# Patient Record
Sex: Male | Born: 1950 | Race: White | Hispanic: No | Marital: Married | Smoking: Never smoker
Health system: Southern US, Community
[De-identification: ages and names within clinical notes are randomized; demographics above are authoritative.]

## PROBLEM LIST (undated history)

## (undated) DIAGNOSIS — E039 Hypothyroidism, unspecified: Secondary | ICD-10-CM

## (undated) DIAGNOSIS — I471 Supraventricular tachycardia, unspecified: Secondary | ICD-10-CM

## (undated) DIAGNOSIS — R7989 Other specified abnormal findings of blood chemistry: Secondary | ICD-10-CM

## (undated) DIAGNOSIS — C4490 Unspecified malignant neoplasm of skin, unspecified: Secondary | ICD-10-CM

## (undated) DIAGNOSIS — Z7902 Long term (current) use of antithrombotics/antiplatelets: Secondary | ICD-10-CM

## (undated) DIAGNOSIS — E785 Hyperlipidemia, unspecified: Secondary | ICD-10-CM

## (undated) DIAGNOSIS — C439 Malignant melanoma of skin, unspecified: Secondary | ICD-10-CM

## (undated) DIAGNOSIS — I700 Atherosclerosis of aorta: Secondary | ICD-10-CM

## (undated) DIAGNOSIS — E7800 Pure hypercholesterolemia, unspecified: Secondary | ICD-10-CM

## (undated) DIAGNOSIS — I2510 Atherosclerotic heart disease of native coronary artery without angina pectoris: Secondary | ICD-10-CM

## (undated) DIAGNOSIS — I10 Essential (primary) hypertension: Secondary | ICD-10-CM

## (undated) DIAGNOSIS — K219 Gastro-esophageal reflux disease without esophagitis: Secondary | ICD-10-CM

## (undated) DIAGNOSIS — N529 Male erectile dysfunction, unspecified: Secondary | ICD-10-CM

## (undated) DIAGNOSIS — I499 Cardiac arrhythmia, unspecified: Secondary | ICD-10-CM

## (undated) DIAGNOSIS — K5790 Diverticulosis of intestine, part unspecified, without perforation or abscess without bleeding: Secondary | ICD-10-CM

## (undated) DIAGNOSIS — I480 Paroxysmal atrial fibrillation: Secondary | ICD-10-CM

## (undated) HISTORY — PX: NECK SURGERY: SHX720

## (undated) HISTORY — DX: Hypothyroidism, unspecified: E03.9

## (undated) HISTORY — DX: Supraventricular tachycardia: I47.1

## (undated) HISTORY — DX: Supraventricular tachycardia, unspecified: I47.10

## (undated) HISTORY — PX: MELANOMA EXCISION: SHX5266

## (undated) HISTORY — DX: Paroxysmal atrial fibrillation: I48.0

## (undated) HISTORY — DX: Malignant melanoma of skin, unspecified: C43.9

## (undated) HISTORY — DX: Male erectile dysfunction, unspecified: N52.9

## (undated) HISTORY — DX: Pure hypercholesterolemia, unspecified: E78.00

## (undated) HISTORY — DX: Unspecified malignant neoplasm of skin, unspecified: C44.90

---

## 1983-02-17 HISTORY — PX: MELANOMA EXCISION: SHX5266

## 2002-12-07 DIAGNOSIS — I480 Paroxysmal atrial fibrillation: Secondary | ICD-10-CM

## 2002-12-07 HISTORY — PX: NECK SURGERY: SHX720

## 2002-12-07 HISTORY — DX: Paroxysmal atrial fibrillation: I48.0

## 2008-01-09 LAB — HM COLONOSCOPY

## 2012-12-07 HISTORY — PX: EYE SURGERY: SHX253

## 2014-12-14 DIAGNOSIS — I480 Paroxysmal atrial fibrillation: Secondary | ICD-10-CM

## 2014-12-14 DIAGNOSIS — E785 Hyperlipidemia, unspecified: Secondary | ICD-10-CM | POA: Insufficient documentation

## 2016-04-02 DIAGNOSIS — E039 Hypothyroidism, unspecified: Secondary | ICD-10-CM | POA: Insufficient documentation

## 2016-04-02 DIAGNOSIS — E7800 Pure hypercholesterolemia, unspecified: Secondary | ICD-10-CM | POA: Insufficient documentation

## 2017-02-10 ENCOUNTER — Encounter: Payer: Self-pay | Admitting: *Deleted

## 2017-02-18 ENCOUNTER — Encounter (INDEPENDENT_AMBULATORY_CARE_PROVIDER_SITE_OTHER): Payer: Self-pay

## 2017-02-18 ENCOUNTER — Ambulatory Visit (INDEPENDENT_AMBULATORY_CARE_PROVIDER_SITE_OTHER): Payer: Medicare Other | Admitting: Cardiovascular Disease

## 2017-02-18 ENCOUNTER — Encounter: Payer: Self-pay | Admitting: Cardiovascular Disease

## 2017-02-18 VITALS — BP 134/66 | HR 64 | Ht 72.0 in | Wt 197.8 lb

## 2017-02-18 DIAGNOSIS — Z7689 Persons encountering health services in other specified circumstances: Secondary | ICD-10-CM

## 2017-02-18 DIAGNOSIS — E785 Hyperlipidemia, unspecified: Secondary | ICD-10-CM

## 2017-02-18 DIAGNOSIS — I480 Paroxysmal atrial fibrillation: Secondary | ICD-10-CM | POA: Diagnosis not present

## 2017-02-18 MED ORDER — ROSUVASTATIN CALCIUM 40 MG PO TABS: 40.0000 mg | ORAL_TABLET | Freq: Every day | ORAL | 3 refills | Status: DC

## 2017-02-18 NOTE — Progress Notes (Signed)
Cardiology Office Note   Date:  02/18/2017   ID:  [name], DOB 02-06-1951, MRN [id number]  PCP:  Marcello Fennel, MD  Cardiologist:   Kathlyn Sacramento, MD   Chief Complaint  Patient presents with  . other    New patient. Self referral for afib. Previous cardiologist in [place] Dr. Darrin Nipper. Meds reviewed verbally with patient.       History of Present Illness: [name] [name] is a 66 y.o. male who Is here today to establish cardiovascular care regarding paroxysmal atrial fibrillation. He was managed in the past by Dr. Darrin Nipper in [place]. He was diagnosed with atrial fibrillation in 2004. He was treated with metoprolol initially which was discontinued due to nightmares. He had a stress echocardiogram in 2013 which was unremarkable. He has been on diltiazem for atrial fibrillation. He had a Holter monitor in January 2016 which showed no evidence of atrial fibrillation.He had premature beats at that time. 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 He is a lifelong nonsmoker. He has been doing well overall with no chest pain, shortness of breath or tachycardia. He has occasional palpitations    Past Medical History:  Diagnosis Date  . Acquired hypothyroidism   . Paroxysmal atrial fibrillation (HCC)    Diagnosed in 2004  . Pure hypercholesterolemia     Past Surgical History:  Procedure Laterality Date  . MELANOMA EXCISION    . NECK SURGERY       Current Outpatient Prescriptions  Medication Sig Dispense Refill  . aspirin 81 MG chewable tablet Chew 81 mg by mouth daily.    . cetirizine (ZYRTEC) 10 MG tablet Take 10 mg by mouth daily.    Marland Kitchen diltiazem (DILACOR XR) 180 MG 24 hr capsule Take 180 mg by mouth daily.    Marland Kitchen esomeprazole (NEXIUM) 20 MG capsule Take 20 mg by mouth daily at 12 noon.    Marland Kitchen levothyroxine (SYNTHROID, LEVOTHROID) 25 MCG tablet Take 25 mcg by  mouth daily before breakfast.    . Multiple Vitamin (MULTIVITAMIN) tablet Take 1 tablet by mouth daily.    . rosuvastatin (CRESTOR) 40 MG tablet Take 1 tablet (40 mg total) by mouth daily. 90 tablet 3   No current facility-administered medications for this visit.     Allergies:   Vicodin [hydrocodone-acetaminophen]    Social History:  The patient  reports that he has never smoked. He has never used smokeless tobacco. He reports that he drinks alcohol. He reports that he does not use drugs.   Family History:  The patient's family history includes Stroke in his father.  He later died of myocardial infarction at the age of 89.There is strong family history of hyperlipidemia and coronary artery disease on his father's side.   ROS:  Please see the history of present illness.   Otherwise, review of systems are positive for none.   All other systems are reviewed and negative.    PHYSICAL EXAM: VS:  BP 134/66 (BP Location: Left Arm, Patient Position: Sitting, Cuff Size: Normal)   Pulse 64   Ht 6' (1.829 m)   Wt 197 lb 12 oz (89.7 kg)   BMI 26.82 kg/m  , BMI Body mass index is 26.82 kg/m. GEN: Well nourished, well developed, in no acute distress  HEENT: normal  Neck: no JVD, carotid bruits, or masses Cardiac: RRR; no murmurs, rubs, or gallops,no edema  Respiratory:  clear to auscultation bilaterally, normal work of breathing GI: soft, nontender, nondistended, + BS MS: no deformity or atrophy  Skin: warm and dry, no rash Neuro:  Strength and sensation are intact Psych: euthymic mood, full affect   EKG:  EKG is ordered today. The ekg ordered today demonstrates normal sinus rhythm with no significant ST or T wave changes.     Recent Labs: No results found for requested labs within last 8760 hours.    Lipid Panel No results found for: CHOL, TRIG, HDL, CHOLHDL, VLDL, LDLCALC, LDLDIRECT    Wt Readings from Last 3 Encounters:  02/18/17 197 lb 12 oz (89.7 kg)      Other studies  Reviewed: Additional studies/ records that were reviewed today include:  Previous records from cardiology and primary care physician including labs were reviewed. Review of the above records demonstrates: Summarized above. 30 minutes were spent reviewing his prior records.  PAD Screen 02/18/2017  Previous PAD dx? No  Previous surgical procedure? No  Pain with walking? No  Feet/toe relief with dangling? No  Painful, non-healing ulcers? No  Extremities discolored? No      ASSESSMENT AND PLAN:  1.  Paroxysmal atrial fibrillation: No evidence of recurrent arrhythmia and currently on long-term treatment with diltiazem. CHADS VASc score is 1. Thus, I'm going to continue low-dose aspirin. No indication for anticoagulation at this point. I might consider an echocardiogram in the future. He had echocardiograms in the past that were unremarkable.  2. Familial hyperlipidemia: The patient's lipid profile and family history are highly suggestive of familial hyperlipidemia. I reviewed his most recent labs from December which showed unremarkable labs overall. His cholesterol was 226, triglyceride 106, HDL 51 and LDL was 154. That is in spite of being on atorvastatin. The dose of atorvastatin cannot be increased to 80 due to being on diltiazem. It's also reported that he had abnormal liver enzymes with higher dose in the past. Thus, I decided to switch him to rosuvastatin 40 mg daily. I requested lipid and liver profile in 6 weeks.    Disposition:   FU with me in 6 weeks  Signed,  Kathlyn Sacramento, MD  02/18/2017 11:52 AM    [place]

## 2017-02-18 NOTE — Patient Instructions (Signed)
Medication Instructions:  Your physician has recommended you make the following change in your medication:  STOP taking atorvastatin START taking rosuvastatin 40mg  once daily   Labwork: Lipid and liver profile in 6 weeks. Nothing to eat or drink after midnight the evening before your labs. Lab work at [facility] outpatient lab. Check in at the first desk on the right. No appointment needed.   Testing/Procedures: none  Follow-Up: Your physician wants you to follow-up in: one year with Dr. Fletcher Anon.  You will receive a reminder letter in the mail two months in advance. If you don't receive a letter, please call our office to schedule the follow-up appointment.   Any Other Special Instructions Will Be Listed Below (If Applicable).     If you need a refill on your cardiac medications before your next appointment, please call your pharmacy.

## 2017-02-19 ENCOUNTER — Other Ambulatory Visit: Payer: Self-pay

## 2017-02-19 ENCOUNTER — Telehealth: Payer: Self-pay | Admitting: Cardiovascular Disease

## 2017-02-19 MED ORDER — DILTIAZEM HCL ER 180 MG PO CP24: 180.0000 mg | ORAL_CAPSULE | Freq: Every day | ORAL | 0 refills | Status: DC

## 2017-02-19 NOTE — Telephone Encounter (Signed)
Pt calling   *STAT* If patient is at the pharmacy, call can be transferred to refill team.   1. Which medications need to be refilled? (please list name of each medication and dose if known)  diltiazem  2. Which pharmacy/location (including street and city if local pharmacy) is medication to be sent to? [facility] [address]  3. Do they need a 30 day or 90 day supply? 90 day

## 2017-02-19 NOTE — Telephone Encounter (Signed)
Refill sent.

## 2017-02-19 NOTE — Telephone Encounter (Signed)
Requested Prescriptions   Signed Prescriptions Disp Refills  . diltiazem (DILACOR XR) 180 MG 24 hr capsule 90 capsule 0    Sig: Take 1 capsule (180 mg total) by mouth daily.    Authorizing Provider: Kathlyn Sacramento A    Ordering User: Janan Ridge

## 2017-04-27 ENCOUNTER — Telehealth: Payer: Self-pay | Admitting: Cardiovascular Disease

## 2017-04-27 NOTE — Telephone Encounter (Signed)
Left detailed message regarding need for fasting labs at the [facility] outpatient lab. Provided call back number if questions.

## 2017-05-20 ENCOUNTER — Other Ambulatory Visit: Payer: Self-pay | Admitting: Cardiovascular Disease

## 2017-06-08 ENCOUNTER — Telehealth: Payer: Self-pay | Admitting: Cardiovascular Disease

## 2017-06-08 NOTE — Telephone Encounter (Signed)
Pt understands he may have labs drawn at the [facility] outpatient lab, no appt necessary. He understands nothing to eat or drink after midnight the evening before his lab work. Pt had no further questions

## 2017-06-08 NOTE — Telephone Encounter (Signed)
Pt calling to let us know [facility].  He is aware is he is a bit behind to do them  But wanted to let us know he has not forgotten  That he is just not yet had a chance to come by and do them He states he will try this week to do them but if not it will be early august when he does them  Just wanted to make sure this is okay to do  Please advise

## 2017-06-15 ENCOUNTER — Other Ambulatory Visit: Payer: Self-pay | Admitting: *Deleted

## 2017-06-15 ENCOUNTER — Telehealth: Payer: Self-pay | Admitting: Cardiovascular Disease

## 2017-06-15 MED ORDER — DILTIAZEM HCL ER 180 MG PO CP24: 180.0000 mg | ORAL_CAPSULE | Freq: Every day | ORAL | 3 refills | Status: DC

## 2017-06-15 NOTE — Telephone Encounter (Signed)
Requested Prescriptions   Signed Prescriptions Disp Refills  . diltiazem (DILACOR XR) 180 MG 24 hr capsule 90 capsule 3    Sig: Take 1 capsule (180 mg total) by mouth daily.    Authorizing Provider: Minna Merritts    Ordering User: Britt Bottom

## 2017-06-15 NOTE — Telephone Encounter (Signed)
°*  STAT* If patient is at the pharmacy, call can be transferred to refill team.   1. Which medications need to be refilled? (please list name of each medication and dose if known)  generic for Diltizem   2. Which pharmacy/location (including street and city if local pharmacy) is medication to be sent to?  [facility] [address]  3. Do they need a 30 day or 90 day supply?  90 day   Pt is going out tonight and needs this called in asap

## 2017-08-12 ENCOUNTER — Telehealth: Payer: Self-pay | Admitting: Cardiovascular Disease

## 2017-08-12 DIAGNOSIS — Z79899 Other long term (current) drug therapy: Secondary | ICD-10-CM

## 2017-08-12 DIAGNOSIS — E785 Hyperlipidemia, unspecified: Secondary | ICD-10-CM

## 2017-08-12 NOTE — Telephone Encounter (Signed)
No answer. Left message to call back.   

## 2017-08-12 NOTE — Telephone Encounter (Signed)
Patient was to have labs at [facility] earlier this year as ordered by Dr Fletcher Anon and did not get them.  He got them with his PCP's office and they are in [place] from 07/22/17. Routing to Dr Fletcher Anon for review.  No answer. Left detailed message, ok per DPR, that we are able to see his lab work in [place], I will forward to Dr Fletcher Anon to make him aware, and let patient know if Dr Fletcher Anon makes any changes with his plan of care.

## 2017-08-12 NOTE — Telephone Encounter (Signed)
His labs were unremarkable except for mildly abnormal liver tests. His cholesterol improved from before. Repeat liver profile in 3-6 months. Continue same medications.

## 2017-08-12 NOTE — Telephone Encounter (Signed)
Pt calling to let us know he did the blood work we ordered was done at his PCP office. He states he was told we can see them, they are in [facility]   Please advise.

## 2017-08-13 NOTE — Telephone Encounter (Signed)
S/w patient. He verbalized understanding of Dr. Tyrell Antonio recommendations and repeat labs in 3-6 months.  Lab slips mailed to patient.

## 2017-08-18 ENCOUNTER — Other Ambulatory Visit: Payer: Self-pay | Admitting: *Deleted

## 2017-08-18 ENCOUNTER — Telehealth: Payer: Self-pay | Admitting: Cardiovascular Disease

## 2017-08-18 MED ORDER — DILTIAZEM HCL ER 180 MG PO CP24: 180.0000 mg | ORAL_CAPSULE | Freq: Every day | ORAL | 3 refills | Status: DC

## 2017-08-18 NOTE — Telephone Encounter (Signed)
diltiazem (DILACOR XR) 180 MG 24 hr capsule 30 capsule 3 08/18/2017    Sig - Route: Take 1 capsule (180 mg total) by mouth daily. - Oral   Sent to pharmacy as: diltiazem (DILACOR XR) 180 MG 24 hr capsule   E-Prescribing Status: Receipt confirmed by pharmacy (08/18/2017 8:30 AM EDT)

## 2017-08-18 NOTE — Telephone Encounter (Signed)
Pt would like cardia XT refilled sent to [facility].

## 2017-11-18 ENCOUNTER — Telehealth: Payer: Self-pay | Admitting: Cardiovascular Disease

## 2017-11-18 ENCOUNTER — Other Ambulatory Visit: Payer: Self-pay

## 2017-11-18 MED ORDER — DILTIAZEM HCL ER 180 MG PO CP24: 180.0000 mg | ORAL_CAPSULE | Freq: Every day | ORAL | 0 refills | Status: DC

## 2017-11-18 NOTE — Telephone Encounter (Signed)
°*  STAT* If patient is at the pharmacy, call can be transferred to refill team.   1. Which medications need to be refilled? (please list name of each medication and dose if known)  Generic for Diltizem   2. Which pharmacy/location (including street and city if local pharmacy) is medication to be sent to? [facility] on [address]   3. Do they need a 30 day or 90 day supply?  90 day

## 2018-02-09 ENCOUNTER — Other Ambulatory Visit: Payer: Self-pay | Admitting: Cardiovascular Disease

## 2018-02-17 ENCOUNTER — Ambulatory Visit: Payer: Medicare Other | Admitting: Cardiovascular Disease

## 2018-02-17 ENCOUNTER — Encounter: Payer: Self-pay | Admitting: Cardiovascular Disease

## 2018-02-17 VITALS — BP 130/60 | HR 73 | Ht 72.0 in | Wt 202.2 lb

## 2018-02-17 DIAGNOSIS — I480 Paroxysmal atrial fibrillation: Secondary | ICD-10-CM

## 2018-02-17 DIAGNOSIS — E7801 Familial hypercholesterolemia: Secondary | ICD-10-CM

## 2018-02-17 DIAGNOSIS — R079 Chest pain, unspecified: Secondary | ICD-10-CM | POA: Diagnosis not present

## 2018-02-17 MED ORDER — EZETIMIBE 10 MG PO TABS: 10.0000 mg | ORAL_TABLET | Freq: Every day | ORAL | 3 refills | Status: DC

## 2018-02-17 NOTE — Progress Notes (Signed)
Cardiology Office Note   Date:  02/17/2018   ID:  [name], DOB 25-Aug-1951, MRN [id number]  PCP:  Derinda Late, MD  Cardiologist:   Kathlyn Sacramento, MD   Chief Complaint  Patient presents with  . other    12 month follow up. Meds reviewed by the pt. verbally. "doing well."       History of Present Illness: [name] [name] is a 67 y.o. male who Is here today to establish cardiovascular care regarding paroxysmal atrial fibrillation. He was managed in the past by Dr. Darrin Nipper in [place]. He was diagnosed with atrial fibrillation in 2004. He was treated with metoprolol initially which was discontinued due to nightmares. He had a stress echocardiogram in 2013 which was unremarkable. He has been on diltiazem for atrial fibrillation. He had a Holter monitor in January 2016 which showed no evidence of atrial fibrillation.He had premature beats at that time. 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 He is a lifelong nonsmoker. During last visit, I switch him to high-dose rosuvastatin.  He has been tolerating the medication with no side effects.  He reports an episode of abdominal pain over the weekend.  It was in the upper epigastric and right upper quadrant area.  The pain lasted for about 2 hours and radiated to his back.  It was associated with heartburn.  He does report occasional chest aching occasionally with exertion.   Past Medical History:  Diagnosis Date  . Acquired hypothyroidism   . Paroxysmal atrial fibrillation (HCC)    Diagnosed in 2004  . Pure hypercholesterolemia     Past Surgical History:  Procedure Laterality Date  . MELANOMA EXCISION    . NECK SURGERY       Current Outpatient Medications  Medication Sig Dispense Refill  . aspirin 81 MG chewable tablet Chew 81 mg by mouth daily.    . cetirizine (ZYRTEC) 10 MG tablet Take 10 mg by mouth daily.     Marland Kitchen diltiazem (DILACOR XR) 180 MG 24 hr capsule Take 1 capsule (180 mg total) by mouth daily. 90 capsule 0  . esomeprazole (NEXIUM) 20 MG capsule Take 20 mg by mouth daily at 12 noon.    Marland Kitchen levothyroxine (SYNTHROID, LEVOTHROID) 25 MCG tablet Take 25 mcg by mouth daily before breakfast.    . Multiple Vitamin (MULTIVITAMIN) tablet Take 1 tablet by mouth daily.    . rosuvastatin (CRESTOR) 40 MG tablet TAKE 1 TABLET BY MOUTH ONCE DAILY 90 tablet 0  . ezetimibe (ZETIA) 10 MG tablet Take 1 tablet (10 mg total) by mouth daily. 90 tablet 3   No current facility-administered medications for this visit.     Allergies:   Hydrocodone-acetaminophen and Vicodin [hydrocodone-acetaminophen]    Social History:  The patient  reports that  has never smoked. he has never used smokeless tobacco. He reports that he drinks alcohol. He reports that he does not use drugs.   Family History:  The patient's family history includes Stroke in his father.  He later died of myocardial infarction at the age of 29.There is strong family history of hyperlipidemia and coronary artery disease on his father's side.   ROS:  Please see the history of present illness.   Otherwise, review of systems are positive for none.   All other systems are reviewed and negative.    PHYSICAL EXAM: VS:  BP 130/60 (BP Location: Left Arm, Patient  Position: Sitting, Cuff Size: Normal)   Pulse 73   Ht 6' (1.829 m)   Wt 202 lb 4 oz (91.7 kg)   BMI 27.43 kg/m  , BMI Body mass index is 27.43 kg/m. GEN: Well nourished, well developed, in no acute distress  HEENT: normal  Neck: no JVD, carotid bruits, or masses Cardiac: RRR; no murmurs, rubs, or gallops,no edema  Respiratory:  clear to auscultation bilaterally, normal work of breathing GI: soft, nontender, nondistended, + BS MS: no deformity or atrophy  Skin: warm and dry, no rash Neuro:  Strength and sensation are intact Psych: euthymic mood, full affect   EKG:  EKG is ordered today. The  ekg ordered today demonstrates normal sinus rhythm with no significant ST or T wave changes.     Recent Labs: No results found for requested labs within last 8760 hours.    Lipid Panel No results found for: CHOL, TRIG, HDL, CHOLHDL, VLDL, LDLCALC, LDLDIRECT    Wt Readings from Last 3 Encounters:  02/17/18 202 lb 4 oz (91.7 kg)  02/18/17 197 lb 12 oz (89.7 kg)        PAD Screen 02/18/2017  Previous PAD dx? No  Previous surgical procedure? No  Pain with walking? No  Feet/toe relief with dangling? No  Painful, non-healing ulcers? No  Extremities discolored? No      ASSESSMENT AND PLAN:  1.  Paroxysmal atrial fibrillation: No evidence of recurrent arrhythmia and currently on long-term treatment with diltiazem. CHADS VASc score is 1.  Continue diltiazem.  2. Familial hyperlipidemia: I reviewed most recent lipid profile in August which showed significant improvement in LDL down to 119.  Liver enzymes were mildly elevated.  I elected to add Zetia 10 mg daily.  Repeat labs in 6 weeks.  3.  Atypical chest pain: I requested a treadmill stress test for evaluation.  4.  Abdominal pain: He is going to see his primary care physician for evaluation.  The symptoms seem to be GI in nature.  Recommend evaluation for gallbladder disease or GERD.   Disposition:   FU with me in 1 year.   Signed,  Kathlyn Sacramento, MD  02/17/2018 4:56 PM    [place]

## 2018-02-17 NOTE — Patient Instructions (Signed)
Medication Instructions:  Your physician has recommended you make the following change in your medication:  1- START Zetia 10 mg (1 tablet) by mouth once a day.   Labwork: Your physician recommends that you return for lab work in: [place].  - Around March 31, 2018. - You will need to be fasting. DO NOT EAT or drink after midnight the morning of the lab work.  - Please go to the [facility]. You will check in at the front desk to the right as you walk into the atrium. Valet Parking is offered if needed.    Testing/Procedures: Your physician has requested that you have an exercise tolerance test. For further information please visit [email or web address]. Please also follow instruction sheet, as given. - DO NOT TAKE Diltiazem the morning of the stress test.   Follow-Up: Your physician wants you to follow-up in: [place]. You will receive a reminder letter in the mail two months in advance. If you don't receive a letter, please call our office to schedule the follow-up appointment. If you need a refill on your cardiac medications before your next appointment, please call your pharmacy.    Exercise Stress Electrocardiogram An exercise stress electrocardiogram is a test to check how blood flows to your heart. It is done to find areas of poor blood flow. You will need to walk on a treadmill for this test. The electrocardiogram will record your heartbeat when you are at rest and when you are exercising. What happens before the procedure?  Do not have drinks with caffeine or foods with caffeine for 24 hours before the test, or as told by your doctor. This includes coffee, tea (even decaf tea), sodas, chocolate, and cocoa.  Follow your doctor's instructions about eating and drinking before the test.  Ask your doctor what medicines you should or should not take before the test. Take your medicines with water unless told by your doctor not to.  If you  use an inhaler, bring it with you to the test.  Bring a snack to eat after the test.  Do not  smoke for 4 hours before the test.  Do not put lotions, powders, creams, or oils on your chest before the test.  Wear comfortable shoes and clothing. What happens during the procedure?  You will have patches put on your chest. Small areas of your chest may need to be shaved. Wires will be connected to the patches.  Your heart rate will be watched while you are resting and while you are exercising.  You will walk on the treadmill. The treadmill will slowly get faster to raise your heart rate.  The test will take about 1-2 hours. What happens after the procedure?  Your heart rate and blood pressure will be watched after the test.  You may return to your normal diet, activities, and medicines or as told by your doctor. This information is not intended to replace advice given to you by your health care provider. Make sure you discuss any questions you have with your health care provider. Document Released: 05/11/2008 Document Revised: 07/22/2016 Document Reviewed: 07/31/2013 Elsevier Interactive Patient Education  [employer].

## 2018-03-15 ENCOUNTER — Telehealth: Payer: Self-pay | Admitting: Cardiovascular Disease

## 2018-03-15 NOTE — Telephone Encounter (Signed)
Left message on pt's cell VM with GXT instructions. Provided call back number if questions.

## 2018-03-16 ENCOUNTER — Ambulatory Visit (INDEPENDENT_AMBULATORY_CARE_PROVIDER_SITE_OTHER): Payer: Medicare Other

## 2018-03-16 DIAGNOSIS — R079 Chest pain, unspecified: Secondary | ICD-10-CM

## 2018-03-18 LAB — EXERCISE TOLERANCE TEST
CHL CUP MPHR: 153 {beats}/min
CHL CUP RESTING HR STRESS: 71 {beats}/min
CSEPHR: 92 %
CSEPPHR: 141 {beats}/min
Estimated workload: 9.6 METS
Exercise duration (min): 7 min
Exercise duration (sec): 48 s

## 2018-05-11 ENCOUNTER — Other Ambulatory Visit
Admission: RE | Admit: 2018-05-11 | Discharge: 2018-05-11 | Disposition: A | Discharge status: Home / Self Care | Payer: Medicare Other | Source: Ambulatory Visit | Attending: Cardiovascular Disease | Admitting: Cardiovascular Disease

## 2018-05-11 DIAGNOSIS — R079 Chest pain, unspecified: Secondary | ICD-10-CM | POA: Diagnosis present

## 2018-05-11 DIAGNOSIS — E7801 Familial hypercholesterolemia: Secondary | ICD-10-CM | POA: Diagnosis present

## 2018-05-11 LAB — LIPID PANEL
CHOLESTEROL: 145 mg/dL (ref 0–200)
HDL: 46 mg/dL (ref >40)
LDL Cholesterol: 73 mg/dL (ref 0–99)
Total CHOL/HDL Ratio: 3.2 RATIO
Triglycerides: 129 mg/dL (ref <150)
VLDL: 26 mg/dL (ref 0–40)

## 2018-05-11 LAB — HEPATIC FUNCTION PANEL
ALBUMIN: 4.1 g/dL (ref 3.5–5.0)
ALT: 61 U/L (ref 17–63)
AST: 47 U/L — AB (ref 15–41)
Alkaline Phosphatase: 76 U/L (ref 38–126)
Bilirubin, Direct: <0.1 mg/dL — ABNORMAL LOW (ref 0.1–0.5)
TOTAL PROTEIN: 6.8 g/dL (ref 6.5–8.1)
Total Bilirubin: 0.6 mg/dL (ref 0.3–1.2)

## 2018-05-12 ENCOUNTER — Other Ambulatory Visit: Payer: Self-pay | Admitting: Cardiovascular Disease

## 2018-05-19 ENCOUNTER — Other Ambulatory Visit: Payer: Self-pay | Admitting: Cardiovascular Disease

## 2019-02-06 ENCOUNTER — Other Ambulatory Visit: Payer: Self-pay | Admitting: Cardiovascular Disease

## 2019-02-06 DIAGNOSIS — R079 Chest pain, unspecified: Secondary | ICD-10-CM

## 2019-03-07 ENCOUNTER — Telehealth: Payer: Self-pay | Admitting: Cardiovascular Disease

## 2019-03-07 NOTE — Telephone Encounter (Signed)
Virtual Visit Pre-Appointment Phone Call  Steps For Call:  1. Confirm consent - "In the setting of the current Covid19 crisis, you are scheduled for a (phone or video) visit with your provider on (date) at (time).  Just as we do with many in-office visits, in order for you to participate in this visit, we must obtain consent.  If you'd like, I can send this to your mychart (if signed up) or email for you to review.  Otherwise, I can obtain your verbal consent now.  All virtual visits are billed to your insurance company just like a normal visit would be.  By agreeing to a virtual visit, we'd like you to understand that the technology does not allow for your provider to perform an examination, and thus may limit your provider's ability to fully assess your condition.  Finally, though the technology is pretty good, we cannot assure that it will always work on either your or our end, and in the setting of a video visit, we may have to convert it to a phone-only visit.  In either situation, we cannot ensure that we have a secure connection.  Are you willing to proceed?"  2. Give patient instructions for WebEx download to smartphone as below if video visit  3. Advise patient to be prepared with any vital sign or heart rhythm information, their current medicines, and a piece of paper and pen handy for any instructions they may receive the day of their visit  4. Inform patient they will receive a phone call 15 minutes prior to their appointment time (may be from unknown caller ID) so they should be prepared to answer  5. Confirm that appointment type is correct in Epic appointment notes (video vs telephone)    TELEPHONE CALL NOTE  [name] [name] has been deemed a candidate for a follow-up tele-health visit to limit community exposure during the Covid-19 pandemic. I spoke with the patient via phone to ensure availability of phone/video source, confirm preferred email & phone number, and discuss  instructions and expectations.  I reminded [name] [name] to be prepared with any vital sign and/or heart rhythm information that could potentially be obtained via home monitoring, at the time of his visit. I reminded [name] [name] to expect a phone call at the time of his visit if his visit.  Did the patient verbally acknowledge consent to treatment? yes  Ace Gins 03/07/2019 3:40 PM   DOWNLOADING THE [place]  - If Apple, go to [employer] and type in WebEx in the search bar. [place] [employer], the blue/green circle. The app is free but as with any other app downloads, their phone may require them to verify saved payment information or Apple password. The patient does NOT have to create an account.  - If Android, ask patient to go to [employer] and type in WebEx in the search bar. [place] [employer], the blue/green circle. The app is free but as with any other app downloads, their phone may require them to verify saved payment information or Android password. The patient does NOT have to create an account.   CONSENT FOR TELE-HEALTH VISIT - PLEASE REVIEW  I hereby voluntarily request, consent and authorize [facility] and its employed or contracted physicians, physician assistants, nurse practitioners or other licensed health care professionals (the Practitioner), to provide me with telemedicine health care services (the Services") as deemed necessary by the treating Practitioner. I acknowledge and consent to  receive the Services by the Practitioner via telemedicine. I understand that the telemedicine visit will involve communicating with the Practitioner through live audiovisual communication technology and the disclosure of certain medical information by electronic transmission. I acknowledge that I have been given the opportunity to request an in-person assessment or other available alternative prior to the telemedicine visit and am  voluntarily participating in the telemedicine visit.  I understand that I have the right to withhold or withdraw my consent to the use of telemedicine in the course of my care at any time, without affecting my right to future care or treatment, and that the Practitioner or I may terminate the telemedicine visit at any time. I understand that I have the right to inspect all information obtained and/or recorded in the course of the telemedicine visit and may receive copies of available information for a reasonable fee.  I understand that some of the potential risks of receiving the Services via telemedicine include:   Delay or interruption in medical evaluation due to technological equipment failure or disruption;  Information transmitted may not be sufficient (e.g. poor resolution of images) to allow for appropriate medical decision making by the Practitioner; and/or   In rare instances, security protocols could fail, causing a breach of personal health information.  Furthermore, I acknowledge that it is my responsibility to provide information about my medical history, conditions and care that is complete and accurate to the best of my ability. I acknowledge that Practitioner's advice, recommendations, and/or decision may be based on factors not within their control, such as incomplete or inaccurate data provided by me or distortions of diagnostic images or specimens that may result from electronic transmissions. I understand that the practice of medicine is not an exact science and that Practitioner makes no warranties or guarantees regarding treatment outcomes. I acknowledge that I will receive a copy of this consent concurrently upon execution via email to the email address I last provided but may also request a printed copy by calling the office of [facility] [place] [facility].    I understand that my insurance will be billed for this visit.   I have read or had this consent read to me.  I understand the  contents of this consent, which adequately explains the benefits and risks of the Services being provided via telemedicine.   I have been provided ample opportunity to ask questions regarding this consent and the Services and have had my questions answered to my satisfaction.  I give my informed consent for the services to be provided through the use of telemedicine in my medical care  By participating in this telemedicine visit I agree to the above.

## 2019-03-13 NOTE — Telephone Encounter (Signed)
Consent obtained verbally.   YOUR CARDIOLOGY TEAM HAS ARRANGED FOR AN E-VISIT FOR YOUR APPOINTMENT - PLEASE REVIEW IMPORTANT INFORMATION BELOW SEVERAL DAYS PRIOR TO YOUR APPOINTMENT  Due to the recent COVID-19 pandemic, we are transitioning in-person office visits to tele-medicine visits in an effort to decrease unnecessary exposure to our patients and staff. Medicare and most insurances are covering these visits without a copay needed. We also encourage you to sign up for MyChart if you have not already done so. You will need a smartphone if possible. For patients that do not have this, we can still complete the visit using a regular telephone but do prefer a smartphone to enable video when possible. You may have a close family member that lives with you that can help. If possible, we also ask that you have a blood pressure cuff and scale at home to measure your blood pressure, heart rate and weight prior to your scheduled appointment. Patients with clinical needs that need an in-person evaluation and testing will still be able to come to the office if absolutely necessary. If you have any questions, feel free to call our office.    IF YOU HAVE A SMARTPHONE, PLEASE DOWNLOAD THE WEBEX APP TO YOUR SMARTPHONE  - If Apple, go to [employer] and type in WebEx in the search bar. [place] [employer], the blue/green circle. The app is free but as with any other app download, your phone may require you to verify saved payment information or Apple password. You do NOT have to create a WebEx account.  - If Android, go to [employer] and type in [employer] in the search bar. [place] [employer], the blue/green circle. The app is free but as with any other app download, your phone may require you to verify saved payment information or Android password. You do NOT have to create a WebEx account.  It is very helpful to have this downloaded before your visit.    2-3 DAYS BEFORE YOUR  APPOINTMENT  You will receive a telephone call from one of our [place] team members - your caller ID may say "Unknown caller." If this is a video visit, we will confirm that you have been able to download the WebEx app. We will remind you check your blood pressure, heart rate and weight prior to your scheduled appointment. If you have an Apple Watch or Kardia, please upload any pertinent ECG strips the day before or morning of your appointment to [place]. Our staff will also make sure you have reviewed the consent and agree to move forward with your scheduled tele-health visit.     THE DAY OF YOUR APPOINTMENT  Approximately 15 minutes prior to your scheduled appointment, you will receive a telephone call from one of [facility] team - your caller ID may say "Unknown caller."  Our staff will confirm medications, vital signs for the day and any symptoms you may be experiencing. Please have this information available prior to the time of visit start. It may also be helpful for you to have a pad of paper and pen handy for any instructions given during your visit. They will also walk you through joining the WebEx smartphone meeting if this is a video visit.    CONSENT FOR TELE-HEALTH VISIT - PLEASE REVIEW  I hereby voluntarily request, consent and authorize [facility] and its employed or contracted physicians, physician assistants, nurse practitioners or other licensed health care professionals (the Practitioner), to provide me with telemedicine health  care services (the "Services") as deemed necessary by the treating Practitioner. I acknowledge and consent to receive the Services by the Practitioner via telemedicine. I understand that the telemedicine visit will involve communicating with the Practitioner through live audiovisual communication technology and the disclosure of certain medical information by electronic transmission. I acknowledge that I have been given the opportunity to request an  in-person assessment or other available alternative prior to the telemedicine visit and am voluntarily participating in the telemedicine visit.  I understand that I have the right to withhold or withdraw my consent to the use of telemedicine in the course of my care at any time, without affecting my right to future care or treatment, and that the Practitioner or I may terminate the telemedicine visit at any time. I understand that I have the right to inspect all information obtained and/or recorded in the course of the telemedicine visit and may receive copies of available information for a reasonable fee.  I understand that some of the potential risks of receiving the Services via telemedicine include:  [place] Delay or interruption in medical evaluation due to technological equipment failure or disruption; . Information transmitted may not be sufficient (e.g. poor resolution of images) to allow for appropriate medical decision making by the Practitioner; and/or  . In rare instances, security protocols could fail, causing a breach of personal health information.  Furthermore, I acknowledge that it is my responsibility to provide information about my medical history, conditions and care that is complete and accurate to the best of my ability. I acknowledge that Practitioner's advice, recommendations, and/or decision may be based on factors not within their control, such as incomplete or inaccurate data provided by me or distortions of diagnostic images or specimens that may result from electronic transmissions. I understand that the practice of medicine is not an exact science and that Practitioner makes no warranties or guarantees regarding treatment outcomes. I acknowledge that I will receive a copy of this consent concurrently upon execution via email to the email address I last provided but may also request a printed copy by calling the office of [facility].    I understand that my insurance will be  billed for this visit.   I have read or had this consent read to me. . I understand the contents of this consent, which adequately explains the benefits and risks of the Services being provided via telemedicine.  . I have been provided ample opportunity to ask questions regarding this consent and the Services and have had my questions answered to my satisfaction. . I give my informed consent for the services to be provided through the use of telemedicine in my medical care  By participating in this telemedicine visit I agree to the above. Yes

## 2019-03-16 ENCOUNTER — Telehealth: Payer: Medicare Other | Admitting: Cardiovascular Disease

## 2019-03-22 ENCOUNTER — Ambulatory Visit: Admit: 2019-03-22 | Payer: Medicare Other | Admitting: Internal Medicine

## 2019-03-22 SURGERY — COLONOSCOPY WITH PROPOFOL
Anesthesia: General

## 2019-04-12 ENCOUNTER — Telehealth: Payer: Self-pay | Admitting: Cardiovascular Disease

## 2019-04-12 MED ORDER — LEVOTHYROXINE SODIUM 25 MCG PO TABS: 25.0000 ug | ORAL_TABLET | Freq: Every day | ORAL | 0 refills | Status: DC

## 2019-04-12 NOTE — Telephone Encounter (Signed)
Returned call to patient.   Pt having a difficult time getting refill for Levothyroxine. He prefers not to go to the hospital for blood work at this time d/t CV19.   He would like temporary refill to medication. He reports that he has been on this medication for years.   Routed to provider to further advise.  Advised pt to call for any further questions or concerns.

## 2019-04-12 NOTE — Addendum Note (Signed)
Addended by: Verlon Au on: 04/12/2019 03:28 PM   Modules accepted: Orders

## 2019-04-12 NOTE — Telephone Encounter (Signed)
Please see note below. Pt requesting Rx for Levothyroxine 25 mcg.

## 2019-04-12 NOTE — Telephone Encounter (Signed)
The patient should have this filled and follow-up labs obtained by his PCP. We have not seen the patient in over a year. We can give him a 30 day prescription but no refills and cannot refill this medication further without blood work.

## 2019-04-12 NOTE — Telephone Encounter (Signed)
°*  STAT* If patient is at the pharmacy, call can be transferred to refill team.   1. Which medications need to be refilled? (please list name of each medication and dose if known)    Levothyroxine 25 mcg po q d   2. Which pharmacy/location (including street and city if local pharmacy) is medication to be sent to? [facility] [address]   3. Do they need a 30 day or 90 day supply? 90   Patient is aware this was rx from pcp but they cannot fill because without a visit to office and labs   Patient states he would rather come here and not deal with the service at pcp office but would like to have routing refills sent due to covid risk.  He understands though that he may be required to come to the office or go get blood work.

## 2019-04-12 NOTE — Telephone Encounter (Signed)
Call to patient. Reviewed advice from Elenor Quinones, PA. He verbalized understanding and agreeable to POC.   30 day supply of Levothyroxine sent to pt preferred pharm.  Advised pt to call for any further questions or concerns.

## 2019-04-26 ENCOUNTER — Telehealth: Payer: Self-pay | Admitting: Cardiovascular Disease

## 2019-04-26 NOTE — Telephone Encounter (Signed)
Patient calling Patient is looking for a new PCP  Patient is considering Dr Caryn Section from [facility] Would like to know Dr Tyrell Antonio insight, if that will be a good fit Please call to discuss

## 2019-04-27 NOTE — Telephone Encounter (Signed)
Yes, Dr. Caryn Section is an excellent primary care physician.

## 2019-04-27 NOTE — Telephone Encounter (Signed)
Patient has been made aware and verbalized his understanding.

## 2019-05-02 ENCOUNTER — Telehealth: Payer: Self-pay | Admitting: Cardiovascular Disease

## 2019-05-02 NOTE — Telephone Encounter (Signed)
Pt states on Sunday night, he had an episode of arrhythmia.  States his heart was racing and felt like his heart was skipping a beat. Please call to discuss.

## 2019-05-02 NOTE — Telephone Encounter (Signed)
Called patient and spoke to him for a few seconds. We could hear each other at first. Then I could hear him saying, "Can you hear me?" I do not think he could hear me.  Called him back and after ringing it went to VM. So I left a message to call us back.  Called patient for 3rd time and was able to speak with him. States on Sunday and Monday night he had episodes where he felt his HR was "all over the place." He's cut back on his caffeine and it has helped him get back to his "normal" skipping beats he has. He felt ill on Sunday and went to doctor and tested negative for Covid.  He was originally scheduled for appointment with [facility] on 05/12/19 but would like sooner. He agreed to virtual with [employer].

## 2019-05-02 NOTE — Progress Notes (Signed)
Virtual Visit via Video Note   This visit type was conducted due to national recommendations for restrictions regarding the COVID-19 Pandemic (e.g. social distancing) in an effort to limit this patient's exposure and mitigate transmission in our community.  Due to his co-morbid illnesses, this patient is at least at moderate risk for complications without adequate follow up.  This format is felt to be most appropriate for this patient at this time.  All issues noted in this document were discussed and addressed.  A limited physical exam was performed with this format.  Please refer to the patient's chart for his consent to telehealth for [facility].   Date:  05/03/2019   ID:  [name] [name], DOB 02/12/1951, MRN [id number]  Patient Location: Home Provider Location: Home  PCP:  Derinda Late, MD  Cardiologist:  Kathlyn Sacramento, MD  Electrophysiologist:  None   Evaluation Performed:  Follow-Up Visit  Chief Complaint:  Evaluation of palpitations   History of Present Illness:    [name] [name] is a 68 y.o. 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  Patient was previously managed by Dr. Dion Saucier in [facility].  He was initially diagnosed with A. fib in 2004 and was managed with metoprolol initially though this was discontinued secondary to nightmares.  Since then, he has been managed with diltiazem.  Prior stress echo in 2013 was unremarkable.  Holter monitor in 12/2014 showed the predominant rhythm was sinus with an average heart rate of 71 bpm, minimum heart rate 45 bpm, maximum heart rate 118 bpm rare isolated PVCs, frequent isolated PACs totaling 4587, 6 atrial couplets, 3 runs of SVT with the longest lasting 7 beats with a maximum rate of 141 bpm, no significant pauses.  No evidence of A. Fib.  He  was last seen in the office in 02/2018 and reported tolerating high-dose Crestor.  He also noted an episode of abdominal pain that was upper epigastric and right upper quadrant regions lasting for approximately 2 hours and radiated to his back with associated heartburn.  He also noted occasional chest aching with exertion.  He has not been managed with full dose oral anticoagulation given a CHADS2VASc of 1.  He underwent treadmill stress test on 03/16/2018 that showed no evidence of ischemia with good exercise capacity with an exercise duration of 7 minutes and 48 seconds.  Peak blood pressure was 187/68.  Motion artifact noted.  Patient called on 5/26 noting palpitations on 5/24 continuing into 5/25.  With this, he decreased his caffeine with some improvement in the palpitations.  He reported testing negative for COVID-19.    Patient is seen in telehealth today for evaluation of palpitations, SOB, fatigue, and chest pain. Patient reports over the years he has had brief episodes of palpitations that he has attributed to Afib that are typically short-lived and without associated symptoms. However, more recently dating back to 04/24/2019 he developed URI symptoms. He was taking OTC Mucinex with this and pushing fluids. He was seen at an outside urgent care on 04/28/2019 and tested negative for influenza as well as COVID-19. Around that same time he began to notice increase in palpitations that were stronger and longer lasting when compared to his typical episodes. With these episodes there was noted increased fatigue as well as chest tightness. Episodes of palpitations would last for several hours at a  time, though were not constant. He did decrease his caffeine intake on 04/30/2019 with some noted improvement in palpitations, though not resolution. He continues to feel fatigued. Throughout the above he did report a T max of 99.7 at symptom onset the week prior, and has been afebrile since. His lower extremity swelling  is stable. He denies any abdominal distension, orthopnea, PND, or early satiety.   Labs: 10/2018 - WBC 6.0, Hgb 14, PLT 170, potassium 4.1, serum creatinine 0.8, AST 44, ALT 67 08/2018 - LDL 73, AST 47, ALT 61 07/2017 - TSH 4.564   The patient does not have symptoms concerning for COVID-19 infection (fever, chills, cough, or new shortness of breath).    Past Medical History:  Diagnosis Date   Acquired hypothyroidism    Paroxysmal atrial fibrillation ([place])    Diagnosed in 2004   Pure hypercholesterolemia    Past Surgical History:  Procedure Laterality Date   MELANOMA EXCISION     NECK SURGERY       Current Meds  Medication Sig   aspirin 81 MG chewable tablet Chew 81 mg by mouth daily.   cetirizine (ZYRTEC) 10 MG tablet Take 10 mg by mouth daily.   diltiazem (DILACOR XR) 180 MG 24 hr capsule Take 1 capsule (180 mg total) by mouth daily.   esomeprazole (NEXIUM) 20 MG capsule Take 20 mg by mouth daily at 12 noon.   ezetimibe (ZETIA) 10 MG tablet TAKE 1 TABLET BY MOUTH ONCE DAILY   levothyroxine (SYNTHROID) 25 MCG tablet Take 1 tablet (25 mcg total) by mouth daily before breakfast.   Multiple Vitamin (MULTIVITAMIN) tablet Take 1 tablet by mouth daily.   rosuvastatin (CRESTOR) 40 MG tablet TAKE 1 TABLET BY MOUTH ONCE DAILY   [DISCONTINUED] DILT-XR 180 MG 24 hr capsule TAKE 1 CAPSULE BY MOUTH ONCE DAILY     Allergies:   Hydrocodone-acetaminophen and Vicodin [hydrocodone-acetaminophen]   Social History   Tobacco Use   Smoking status: Never Smoker   Smokeless tobacco: Never Used  Substance Use Topics   Alcohol use: Yes    Comment: Occ.    Drug use: No     Family Hx: The patient's family history includes Stroke in his father.  ROS:   Please see the history of present illness.     All other systems reviewed and are negative.   Prior CV studies:   The following studies were reviewed today:  ETT 03/2018:  Blood pressure demonstrated a normal response  to exercise.  No T wave inversion was noted during stress.  There was no ST segment deviation noted during stress.   Normal treadmill stress test with no evidence of ischemia. Good exercise capacity with exercise duration of 7 minutes and 48 seconds and peak blood pressure of 187/68. Suboptimal study due to motion artifacts.  Labs/Other Tests and Data Reviewed:    EKG:  An ECG dated 02/17/2018 was personally reviewed today and demonstrated:  sinus rhythm with acute st/t changes  Recent Labs: 05/11/2018: ALT 61   Recent Lipid Panel Lab Results  Component Value Date/Time   CHOL 145 05/11/2018 08:45 AM   TRIG 129 05/11/2018 08:45 AM   HDL 46 05/11/2018 08:45 AM   CHOLHDL 3.2 05/11/2018 08:45 AM   LDLCALC 73 05/11/2018 08:45 AM    Wt Readings from Last 3 Encounters:  05/03/19 199 lb (90.3 kg)  02/17/18 202 lb 4 oz (91.7 kg)  02/18/17 197 lb 12 oz (89.7 kg)     Objective:  Vital Signs:  Ht 6' (1.829 m)    Wt 199 lb (90.3 kg)    BMI 26.99 kg/m    VITAL SIGNS:  reviewed GEN:  no acute distress EYES:  sclerae anicteric, EOMI - Extraocular Movements Intact  ASSESSMENT & PLAN:    1. Palpitations/PAF/pSVT/PACs: It is difficult to determine if he is having breakthrough Afib vs SVT vs atrial or ventricular ectopy. Plan to have the patient come in on 5/28 for an in person visit with EKG, echo, and labs. Will likely plan for outpatient cardiac monitoring unless arrhythmia/significant ectopy is noted on 12-lead EKG tomorrow. Further recommendations pending this work up.   2. SOB/fatigue: Uncertain if this is related to his recent viral-like illness. COVID-19 negative per patient report. Plan for echo on 05/04/2019 followed by in person visit as above. Plan to check CBC, CMP, TSH, magnesium.   3. Chest pain: Currently asymptomatic. Recent low risk ETT last year. Recommend in person visit with EKG and echo. Continue ASA.   4. Familial hyperlipidemia: LDL of 73 from 08/2018 with mildly  elevated LFT from 10/2018. Currently, he remains on Crestor and Zetia. Plan to trend liver function in follow up tomorrow.   COVID-19 Education: The signs and symptoms of COVID-19 were discussed with the patient and how to seek care for testing (follow up with PCP or arrange E-visit).  The importance of social distancing was discussed today.  Time:   Today, I have spent 23 minutes with the patient with telehealth technology discussing the above problems.     Medication Adjustments/Labs and Tests Ordered: Current medicines are reviewed at length with the patient today.  Concerns regarding medicines are outlined above.   Tests Ordered: No orders of the defined types were placed in this encounter.   Medication Changes: No orders of the defined types were placed in this encounter.   Disposition:  Follow up in office this week  Signed, Christell Faith, PA-C  05/03/2019 1:37 PM    [place]

## 2019-05-03 ENCOUNTER — Encounter: Payer: Self-pay | Admitting: Physician Assistant

## 2019-05-03 ENCOUNTER — Other Ambulatory Visit: Payer: Self-pay

## 2019-05-03 ENCOUNTER — Telehealth: Payer: Self-pay

## 2019-05-03 ENCOUNTER — Telehealth (INDEPENDENT_AMBULATORY_CARE_PROVIDER_SITE_OTHER): Payer: Medicare Other | Admitting: Physician Assistant

## 2019-05-03 VITALS — BP 129/69 | HR 65 | Ht 72.0 in | Wt 199.0 lb

## 2019-05-03 DIAGNOSIS — I471 Supraventricular tachycardia, unspecified: Secondary | ICD-10-CM

## 2019-05-03 DIAGNOSIS — I491 Atrial premature depolarization: Secondary | ICD-10-CM

## 2019-05-03 DIAGNOSIS — E7801 Familial hypercholesterolemia: Secondary | ICD-10-CM

## 2019-05-03 DIAGNOSIS — R002 Palpitations: Secondary | ICD-10-CM | POA: Diagnosis not present

## 2019-05-03 DIAGNOSIS — E7849 Other hyperlipidemia: Secondary | ICD-10-CM

## 2019-05-03 DIAGNOSIS — R5383 Other fatigue: Secondary | ICD-10-CM

## 2019-05-03 DIAGNOSIS — I480 Paroxysmal atrial fibrillation: Secondary | ICD-10-CM

## 2019-05-03 DIAGNOSIS — R079 Chest pain, unspecified: Secondary | ICD-10-CM

## 2019-05-03 DIAGNOSIS — R0602 Shortness of breath: Secondary | ICD-10-CM

## 2019-05-03 NOTE — Patient Instructions (Signed)
It was a pleasure to speak with you on the phone today! Thank you for allowing [place] to continue taking care of your [facility] needs during this time.   Feel free to call as needed for questions and concerns related to your cardiac needs.   Medication Instructions:  Your physician recommends that you continue on your current medications as directed. Please refer to the Current Medication list given to you today.  If you need a refill on your cardiac medications before your next appointment, please call your pharmacy.   Lab work: None ordered  If you have labs (blood work) drawn today and your tests are completely normal, you will receive your results only by: Marland Kitchen MyChart Message (if you have MyChart) OR . A paper copy in the mail If you have any lab test that is abnormal or we need to change your treatment, we will call you to review the results.  Testing/Procedures: 1- Echo tomorrow at 8:30A  Follow-Up: At [facility], you and your health needs are our priority.  As part of our continuing mission to provide you with exceptional heart care, we have created designated Provider Care Teams.  These Care Teams include your primary Cardiologist (physician) and Advanced Practice Providers (APPs -  Physician Assistants and Nurse Practitioners) who all work together to provide you with the care you need, when you need it. OV tomorrow. 10 AM, immediately following Echo.

## 2019-05-03 NOTE — Telephone Encounter (Signed)
Pt had virtual visit today with  Christell Faith, PA. Per [name], have patient come in tomorrow for Echo and in office appt with EKG.   Call to patient for scheduling and to review in office procedure.       COVID-19 Pre-Screening Questions:  . In the past 7 to 10 days have you had a cough,  shortness of breath, headache, congestion, fever (100 or greater) body aches, chills, sore throat, or sudden loss of taste or sense of smell? NO . Have you been around anyone with known Covid 19.NO . Have you been around anyone who is awaiting Covid 19 test results in the past 7 to 10 days?NO . Have you been around anyone who has been exposed to Covid 19, or has mentioned symptoms of Covid 19 within the past 7 to 10 days? NO  If you have any concerns/questions about symptoms patients report during screening (either on the phone or at threshold). Contact the provider seeing the patient or DOD for further guidance.  If neither are available contact a member of the leadership team.    Pt verbally agreed to office CV19 precautions. Advised pt to call for any further questions or concerns.

## 2019-05-03 NOTE — Progress Notes (Signed)
Cardiology Office Note Date:  05/04/2019  Patient ID:  [name] [name] Apr 06, 1951, MRN [id number] PCP:  Derinda Late, MD  Cardiologist:  Dr. Fletcher Anon, MD    Chief Complaint: Follow up palpitations  History of Present Illness: [name] [name] is a 68 y.o. 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  Patient was previously managed by Dr. Dion Saucier in [facility].  He was initially diagnosed with A. fib in 2004 and was managed with metoprolol initially though this was discontinued secondary to nightmares.  Since then, he has been managed with diltiazem.  Prior stress echo in 2013 was unremarkable.  Holter monitor in 12/2014 showed the predominant rhythm was sinus with an average heart rate of 71 bpm, minimum heart rate 45 bpm, maximum heart rate 118 bpm rare isolated PVCs, frequent isolated PACs totaling 4587, 6 atrial couplets, 3 runs of SVT with the longest lasting 7 beats with a maximum rate of 141 bpm, no significant pauses.  No evidence of A. Fib.  He was last seen in the office in 02/2018 and reported tolerating high-dose Crestor.  He also noted an episode of abdominal pain that was upper epigastric and right upper quadrant regions lasting for approximately 2 hours and radiated to his back with associated heartburn.  He also noted occasional chest aching with exertion.  He has not been managed with full dose oral anticoagulation given a CHADS2VASc of 1.  He underwent treadmill stress test on 03/16/2018 that showed no evidence of ischemia with good exercise capacity with an exercise duration of 7 minutes and 48 seconds.  Peak blood pressure was 187/68.  Motion artifact noted.  Patient called on 5/26 noting palpitations on 5/24 continuing into 5/25.  With this, he decreased his  caffeine with some improvement in the palpitations.  He reported testing negative for COVID-19.    He was evaluated via telehealth on 05/03/2019 for increase in palpitations as well as fatigue, and chest pain. At that time he reported brief episodes of palpitations that he has attributed to Afib that are typically short-lived and without associated symptoms over the years. However, more recently dating back to 04/24/2019 he developed URI symptoms. He was taking OTC Mucinex with this and pushing fluids. He was seen at an outside urgent care on 04/28/2019 and tested negative for influenza as well as COVID-19. Around that same time he began to notice increase in palpitations that were stronger and longer lasting when compared to his typical episodes. With these episodes there was noted increased fatigue as well as chest tightness. Episodes of palpitations would last for several hours at a time, though were not constant. He did decrease his caffeine intake on 04/30/2019 with some noted improvement in palpitations, though not resolution. In telehealth follow up, he continued to feel fatigued and note intermittent brief episodes of palpitations (obtained by self checking a carotid pulse). He did not check his BP or HR during any of the above episodes, though did state his BP was in the 622W systolic on the morning of 5/27. Given the above, in person evaluation was advised.   With in person evaluation today, he continued to feel brief episodes of palpitations and report associated increased fatigue and chest pain.  He underwent echocardiogram as part of this appointment, at which time he noticed tenderness with deeper  pressing of the ultrasound probe and located in his central chest/sternal region.  He reported that this tenderness to deep palpation felt similar in character to his previous chest pain episodes, which occurred with his palpitation episodes.  He also reported an episode of chest pain that occurred that same  morning (5/28) and while brushing his hair.  He stated this episode was different in character than his previous CP episodes and only occurred with the movement of brushing his hair. As above, he noted improvement without complete resolution of his palpitations since reducing caffeine intake.he continues to note significant fatigue and lower extremity weakness which is unchanged.  He has remained afebrile outside of his isolated reading at the beginning of his symptoms with a T-max of 99.7.  He denies any recent injuries or trauma to the skin.  Preliminary read from the echo tech was preserved LV systolic function with inability to exclude bicuspid aortic valve, mild to moderate aortic valve regurgitation with a turbulent eccentric jet and aortic root measuring approximately 3.2 cm.  We are awaiting formal interpretation of the echo.   Of note, the patient tells me he was told at the time of his prior echo in 2013 there may have been some mitral valve insufficiency though does not recall being previously told of aortic valve regurgitation.  We have reached out to his primary cardiologist office to obtain his prior echo report but have been unable to get in touch with anyone.  Labs: 10/2018 - WBC 6.0, Hgb 14, PLT 170, potassium 4.1, serum creatinine 0.8, AST 44, ALT 67 08/2018 - LDL 73, AST 47, ALT 61 07/2017 - TSH 4.564   Past Medical History:  Diagnosis Date  . Acquired hypothyroidism   . Paroxysmal atrial fibrillation (HCC)    Diagnosed in 2004  . Pure hypercholesterolemia     Past Surgical History:  Procedure Laterality Date  . MELANOMA EXCISION    . NECK SURGERY      Current Meds  Medication Sig  . aspirin 81 MG chewable tablet Chew 81 mg by mouth daily.  . cetirizine (ZYRTEC) 10 MG tablet Take 10 mg by mouth daily.  Marland Kitchen diltiazem (DILACOR XR) 180 MG 24 hr capsule Take 1 capsule (180 mg total) by mouth daily.  Marland Kitchen esomeprazole (NEXIUM) 20 MG capsule Take 20 mg by mouth daily at 12 noon.  .  ezetimibe (ZETIA) 10 MG tablet TAKE 1 TABLET BY MOUTH ONCE DAILY  . levothyroxine (SYNTHROID) 25 MCG tablet Take 1 tablet (25 mcg total) by mouth daily before breakfast.  . Multiple Vitamin (MULTIVITAMIN) tablet Take 1 tablet by mouth daily.  . rosuvastatin (CRESTOR) 40 MG tablet TAKE 1 TABLET BY MOUTH ONCE DAILY   Current Facility-Administered Medications for the 05/04/19 encounter (Office Visit) with Rise Mu, PA-C  Medication  . perflutren lipid microspheres (DEFINITY) IV suspension    Allergies:   Hydrocodone-acetaminophen and Vicodin [hydrocodone-acetaminophen]   Social History:  The patient  reports that he has never smoked. He has never used smokeless tobacco. He reports current alcohol use. He reports that he does not use drugs.   Family History:  The patient's family history includes Stroke in his father.  ROS:   Review of Systems  Constitutional: Positive for fever and malaise/fatigue. Negative for chills, diaphoresis and weight loss.       No additional report of fever or chills since his transient fever of 99.57F 5/22  HENT: Negative for congestion.   Eyes: Negative for discharge and redness.  Respiratory: Positive for shortness of breath. Negative for cough, hemoptysis, sputum production and wheezing.   Cardiovascular: Positive for chest pain, palpitations and leg swelling. Negative for orthopnea, claudication and PND.       Reports trivial leg edema that has been ongoing for years and worsens with intake of salt  Gastrointestinal: Negative for abdominal pain, blood in stool, heartburn, melena, nausea and vomiting.  Genitourinary: Negative for hematuria.  Musculoskeletal: Negative for falls and myalgias.  Skin: Negative for rash.  Neurological: Positive for weakness. Negative for dizziness, tingling, tremors, sensory change, speech change, focal weakness and loss of consciousness.       B/l leg weakness that occurs when ambulating to the restroom, specifically after  waking in the middle of the night  Endo/Heme/Allergies: Does not bruise/bleed easily.  Psychiatric/Behavioral: Negative for substance abuse. The patient is not nervous/anxious.   All other systems reviewed and are negative.    PHYSICAL EXAM:  VS:  BP 130/68 (BP Location: Left Arm, Patient Position: Sitting, Cuff Size: Normal)   Pulse 61   Ht 6' (1.829 m)   Wt 203 lb 12.8 oz (92.4 kg)   BMI 27.64 kg/m  BMI: Body mass index is 27.64 kg/m.  Physical Exam  Constitutional: He is oriented to person, place, and time. He appears well-developed and well-nourished.  Appears fatigued  HENT:  Head: Normocephalic and atraumatic.  Eyes: Pupils are equal, round, and reactive to light. Conjunctivae and EOM are normal.  Neck: Normal range of motion. No JVD present.  Cardiovascular: Normal rate, regular rhythm and intact distal pulses. Exam reveals no gallop and no friction rub.  Murmur heard. High-pitched blowing decrescendo early diastolic murmur is present with a grade of 1/6 at the upper right sternal border radiating to the apex. Pulses:      Carotid pulses are 2+ on the right side and 2+ on the left side.      Radial pulses are 2+ on the right side and 2+ on the left side.       Dorsalis pedis pulses are 2+ on the right side and 2+ on the left side.       Posterior tibial pulses are 2+ on the right side and 2+ on the left side.  Pulmonary/Chest: Effort normal and breath sounds normal. No respiratory distress. He has no wheezes. He has no rales. He exhibits tenderness.  Tenderness in area of sternum with deep palpation of ultrasound probe  Abdominal: He exhibits no distension. There is no abdominal tenderness.  Musculoskeletal: Normal range of motion.        General: No tenderness or deformity.     Comments: Trivial bilateral lower extremity edema, not new for him  Neurological: He is alert and oriented to person, place, and time.  Skin: Skin is dry.  No obvious signs of trauma or cellulitis  to the skin.  Psychiatric: He has a normal mood and affect. His behavior is normal. Judgment and thought content normal.     EKG:  Was ordered and interpreted by me today. Shows normal sinus rhythm, 61 bpm,  no acute changes from previous EKG  Recent Labs: 05/11/2018: ALT 61  05/11/2018: Cholesterol 145; HDL 46; LDL Cholesterol 73; Total CHOL/HDL Ratio 3.2; Triglycerides 129; VLDL 26   CrCl cannot be calculated (No successful lab value found.).   Wt Readings from Last 3 Encounters:  05/04/19 203 lb 12.8 oz (92.4 kg)  05/03/19 199 lb (90.3 kg)  02/17/18 202 lb 4 oz (91.7 kg)  Other studies reviewed: Additional studies/records reviewed today include: summarized above  ASSESSMENT AND PLAN:  Suspected aortic valve insufficiency -SOB/fatigue reported with urgent care visit 5/22 and COVID-19 negative, flu negative. No current fever. -Initial echocardiogram concerning for possible bicuspid valve / aortic insufficiency.  TTE pending formal read by MD.  It appears based off prior documentation this may be a new finding for him.  We have reached out to his primary cardiologist office to obtain formal echo report from study in 2013 though have been unable to reach anyone.  We have left a message. - Long discussion with patient regarding this potential new finding and his constellation of symptoms.  We cannot exclude aortic dissection therefore we will send the patient to the emergency department for further work-up and evaluation including recommendation for CTA of the aorta as well as laboratory evaluation including CBC, CMP, TSH, magnesium, and blood cultures given his constellation of symptoms and isolated low-grade fever. -Patient may need to be evaluated for possible subacute bacterial endocarditis.  We will await formal read of echo.   - Further recommendations pending emergency department evaluation.  - Case was discussed with Dr. Saunders Revel over the phone. - Patient signed out to the ED charge  nurse.  Palpitations/PAF/pSVT/PACs - Continues to report intermittent episodes of palpitations and associated fatigue and shortness of breath as above in HPI. EKG performed in the office NSR with no acute changes or signs of arrhythmia or ectopy. - Patient sent to the emergency department as above. If ED evaluation without acute findings / no findings of arrhythmia on ED telemetry, will plan to evaluate patient further for underlying arrhythmia v ectopy with a two-week Zio monitor. -Recommend labs as above. - Continue diltiazem for rate control. Patient is not on anticoagulation at this time d/t CHA2DS2VASc score of 1 (age).  Chest Pain/SOB - No current chest pain.  Previous chest pain episodes reported as above in HPI. Symptoms both typical and atypical in nature and with today's episode of CP more consistent with that of MSK pain. Previous episodes of chest pain described as more typical / consistent with angina. - Low risk ETT last year. - EKG today without acute ST/T wave changes from previous. - Echo done today, pending official read as above.   - Sent to ED for more complete work-up, including CTA.  We should be able to evaluate for significant coronary artery calcification with this study as well. - Labs as above. - Continue ASA. Further recommendations pending ED evaluation to include discussion of ischemic testing..  Familial HLD - Trend liver function as above. - Continue medical management with Crestor and Zetia.  Disposition: F/u with Dr. Fletcher Anon or an APP in ~4-6 weeks or 2 weeks after end of Zio monitoring to allow for mailing and processing time or sooner, depending on ED findings.  Current medicines are reviewed at length with the patient today.  The patient did not have any concerns regarding medicines.  Assessment and plan completed by Marrianne Mood, PA-C.  The patient was discussed with her and evaluated by myself.  Changes were made in the note as needed.  Signed,  Christell Faith, PA-C 05/04/2019 9:50 AM     [place] [address] [place], [place] [address] [phone]

## 2019-05-04 ENCOUNTER — Ambulatory Visit (INDEPENDENT_AMBULATORY_CARE_PROVIDER_SITE_OTHER): Payer: Medicare Other

## 2019-05-04 ENCOUNTER — Encounter: Payer: Self-pay | Admitting: Physician Assistant

## 2019-05-04 ENCOUNTER — Emergency Department: Payer: Medicare Other

## 2019-05-04 ENCOUNTER — Other Ambulatory Visit: Payer: Self-pay | Admitting: Physician Assistant

## 2019-05-04 ENCOUNTER — Other Ambulatory Visit: Payer: Self-pay

## 2019-05-04 ENCOUNTER — Emergency Department
Admission: EM | Admit: 2019-05-04 | Discharge: 2019-05-04 | Disposition: A | Discharge status: Home / Self Care | Payer: Medicare Other | Attending: Emergency Medicine | Admitting: Emergency Medicine

## 2019-05-04 ENCOUNTER — Telehealth: Payer: Self-pay | Admitting: Physician Assistant

## 2019-05-04 ENCOUNTER — Ambulatory Visit: Payer: Medicare Other | Admitting: Physician Assistant

## 2019-05-04 VITALS — BP 130/68 | HR 61 | Ht 72.0 in | Wt 203.8 lb

## 2019-05-04 DIAGNOSIS — Z79899 Other long term (current) drug therapy: Secondary | ICD-10-CM | POA: Diagnosis not present

## 2019-05-04 DIAGNOSIS — I351 Nonrheumatic aortic (valve) insufficiency: Secondary | ICD-10-CM | POA: Diagnosis not present

## 2019-05-04 DIAGNOSIS — R0602 Shortness of breath: Secondary | ICD-10-CM | POA: Insufficient documentation

## 2019-05-04 DIAGNOSIS — R002 Palpitations: Secondary | ICD-10-CM

## 2019-05-04 DIAGNOSIS — Z7982 Long term (current) use of aspirin: Secondary | ICD-10-CM | POA: Insufficient documentation

## 2019-05-04 DIAGNOSIS — I4891 Unspecified atrial fibrillation: Secondary | ICD-10-CM

## 2019-05-04 DIAGNOSIS — R531 Weakness: Secondary | ICD-10-CM | POA: Insufficient documentation

## 2019-05-04 DIAGNOSIS — R079 Chest pain, unspecified: Secondary | ICD-10-CM

## 2019-05-04 DIAGNOSIS — E7801 Familial hypercholesterolemia: Secondary | ICD-10-CM

## 2019-05-04 DIAGNOSIS — R5383 Other fatigue: Secondary | ICD-10-CM

## 2019-05-04 DIAGNOSIS — I480 Paroxysmal atrial fibrillation: Secondary | ICD-10-CM | POA: Diagnosis not present

## 2019-05-04 DIAGNOSIS — Z1159 Encounter for screening for other viral diseases: Secondary | ICD-10-CM | POA: Insufficient documentation

## 2019-05-04 DIAGNOSIS — I491 Atrial premature depolarization: Secondary | ICD-10-CM

## 2019-05-04 DIAGNOSIS — I471 Supraventricular tachycardia: Secondary | ICD-10-CM | POA: Diagnosis not present

## 2019-05-04 DIAGNOSIS — E038 Other specified hypothyroidism: Secondary | ICD-10-CM | POA: Insufficient documentation

## 2019-05-04 LAB — CBC
HCT: 45.1 % (ref 39.0–52.0)
Hemoglobin: 14.9 g/dL (ref 13.0–17.0)
MCH: 29.8 pg (ref 26.0–34.0)
MCHC: 33 g/dL (ref 30.0–36.0)
MCV: 90.2 fL (ref 80.0–100.0)
Platelets: 173 10*3/uL (ref 150–400)
RBC: 5 MIL/uL (ref 4.22–5.81)
RDW: 13.5 % (ref 11.5–15.5)
WBC: 4.7 10*3/uL (ref 4.0–10.5)
nRBC: 0 % (ref 0.0–0.2)

## 2019-05-04 LAB — COMPREHENSIVE METABOLIC PANEL
ALT: 61 U/L — ABNORMAL HIGH (ref 0–44)
AST: 56 U/L — ABNORMAL HIGH (ref 15–41)
Albumin: 4.3 g/dL (ref 3.5–5.0)
Alkaline Phosphatase: 56 U/L (ref 38–126)
Anion gap: 9 (ref 5–15)
BUN: 22 mg/dL (ref 8–23)
CO2: 24 mmol/L (ref 22–32)
Calcium: 10 mg/dL (ref 8.9–10.3)
Chloride: 107 mmol/L (ref 98–111)
Creatinine, Ser: 0.84 mg/dL (ref 0.61–1.24)
GFR calc Af Amer: >60 mL/min (ref >60)
GFR calc non Af Amer: >60 mL/min (ref >60)
Glucose, Bld: 104 mg/dL — ABNORMAL HIGH (ref 70–99)
Potassium: 3.9 mmol/L (ref 3.5–5.1)
Sodium: 140 mmol/L (ref 135–145)
Total Bilirubin: 0.8 mg/dL (ref 0.3–1.2)
Total Protein: 7.2 g/dL (ref 6.5–8.1)

## 2019-05-04 LAB — MAGNESIUM: Magnesium: 2.2 mg/dL (ref 1.7–2.4)

## 2019-05-04 LAB — TROPONIN I: Troponin I: <0.03 ng/mL (ref <0.03)

## 2019-05-04 LAB — TSH: TSH: 2.987 u[IU]/mL (ref 0.350–4.500)

## 2019-05-04 LAB — SARS CORONAVIRUS 2 BY RT PCR (HOSPITAL ORDER, PERFORMED IN ~~LOC~~ HOSPITAL LAB): SARS Coronavirus 2: NEGATIVE

## 2019-05-04 MED ORDER — ROSUVASTATIN CALCIUM 40 MG PO TABS: 40.0000 mg | ORAL_TABLET | Freq: Every day | ORAL | 3 refills | Status: DC

## 2019-05-04 MED ORDER — PERFLUTREN LIPID MICROSPHERE
1.0000 mL | INTRAVENOUS | Status: AC | PRN
  Administered 2019-05-04: 2 mL via INTRAVENOUS

## 2019-05-04 MED ORDER — IOPAMIDOL (ISOVUE-370) INJECTION 76%
100.0000 mL | Freq: Once | INTRAVENOUS | Status: AC | PRN
  Administered 2019-05-04: 14:00:00 100 mL via INTRAVENOUS

## 2019-05-04 NOTE — Patient Instructions (Signed)
Medication Instructions:  Your physician recommends that you continue on your current medications as directed. Please refer to the Current Medication list given to you today.  If you need a refill on your cardiac medications before your next appointment, please call your pharmacy.   Lab work: None ordered  If you have labs (blood work) drawn today and your tests are completely normal, you will receive your results only by: Marland Kitchen MyChart Message (if you have MyChart) OR . A paper copy in the mail If you have any lab test that is abnormal or we need to change your treatment, we will call you to review the results.  Testing/Procedures: None ordered   Follow-Up: At [facility], you and your health needs are our priority.  As part of our continuing mission to provide you with exceptional heart care, we have created designated Provider Care Teams.  These Care Teams include your primary Cardiologist (physician) and Advanced Practice Providers (APPs -  Physician Assistants and Nurse Practitioners) who all work together to provide you with the care you need, when you need it. You will need a follow up appointment in per ED findings .

## 2019-05-04 NOTE — ED Notes (Signed)
Pt unhooked per request to go to toilet in room. Pt's gait steady and independent

## 2019-05-04 NOTE — Telephone Encounter (Signed)
Returned call to patient.  I explained zio procedure for receiving and placing. Pt verbalized understanding.   Pt wanting to hear [name] Faith, PA interpretation of ED visit findings. He proposed virtual visit to further discuss. I told pt that we will have more information when final Echo read comes back.   Pt persistent. I told pt I will consult with [name] and get back to him.

## 2019-05-04 NOTE — Telephone Encounter (Signed)
error 

## 2019-05-04 NOTE — ED Provider Notes (Signed)
[facility] Emergency Department Provider Note  Time seen: 12:57 PM  I have reviewed the triage vital signs and the nursing notes.   HISTORY  Chief Complaint Weakness   HPI [name] [name] is a 68 y.o. male with a past medical history of paroxysmal atrial fibrillation, hyperlipidemia, presents to the emergency department referred from cardiology for further evaluation.  According to the patient he has been feeling somewhat weak over the past 5 or 6 days, had a low-grade temperature at home of 99.  Patient was seen by cardiology via a tele-visit, and then they had the patient come to the office for further evaluation today.  Patient was noted on echocardiogram to have aortic valve leakage, given his isolated 99 temperature several days ago they recommended he come to the emergency department for further evaluation to help rule out other concerning issues like bacterial endocarditis or possible aortic dissection.  Currently the patient appears well denies any chest pain whatsoever.  Denies any shortness of breath.  Does state he feels somewhat weak over the past for 5 days.  Denies any cough or congestion.  No known sick contacts.     Past Medical History:  Diagnosis Date  . Acquired hypothyroidism   . Paroxysmal atrial fibrillation (HCC)    Diagnosed in 2004  . Pure hypercholesterolemia     There are no active problems to display for this patient.   Past Surgical History:  Procedure Laterality Date  . MELANOMA EXCISION    . NECK SURGERY      Prior to Admission medications   Medication Sig Start Date End Date Taking? Authorizing Provider  aspirin 81 MG chewable tablet Chew 81 mg by mouth daily.   Yes [provider]  cetirizine (ZYRTEC) 10 MG tablet Take 10 mg by mouth daily.   Yes [provider]  diltiazem (DILACOR XR) 180 MG 24 hr capsule Take 1 capsule (180 mg total) by mouth daily. 11/18/17  Yes Wellington Hampshire, MD  esomeprazole  (NEXIUM) 20 MG capsule Take 20 mg by mouth daily at 12 noon.   Yes [provider]  ezetimibe (ZETIA) 10 MG tablet TAKE 1 TABLET BY MOUTH ONCE DAILY 02/06/19  Yes Wellington Hampshire, MD  levothyroxine (SYNTHROID) 25 MCG tablet Take 1 tablet (25 mcg total) by mouth daily before breakfast. 04/12/19  Yes Marrianne Mood D, PA-C  Multiple Vitamin (MULTIVITAMIN) tablet Take 1 tablet by mouth daily.   Yes [provider]  omeprazole (PRILOSEC) 10 MG capsule Take 10 mg by mouth daily.   Yes [provider]  rosuvastatin (CRESTOR) 40 MG tablet Take 1 tablet (40 mg total) by mouth daily. 05/04/19  Yes Rise Mu, PA-C    Allergies  Allergen Reactions  . Hydrocodone-Acetaminophen Nausea And Vomiting and Other (See Comments)  . Vicodin [Hydrocodone-Acetaminophen] Nausea And Vomiting    Vomiting    Family History  Problem Relation Age of Onset  . Stroke Father     Social History Social History   Tobacco Use  . Smoking status: Never Smoker  . Smokeless tobacco: Never Used  Substance Use Topics  . Alcohol use: Yes    Comment: Occ.   . Drug use: No    Review of Systems Constitutional: Negative for fever.  Positive for generalized weakness. ENT: Negative for recent illness/congestion Cardiovascular: Negative for chest pain. Respiratory: Negative for shortness of breath.  Negative for cough. Gastrointestinal: Negative for abdominal pain, vomiting  Genitourinary: Negative for urinary compaints Musculoskeletal:  Negative for musculoskeletal complaints Skin: Negative for skin complaints  Neurological: Negative for headache All other ROS negative  ____________________________________________   PHYSICAL EXAM:  VITAL SIGNS: ED Triage Vitals  Enc Vitals Group     BP 05/04/19 1054 (!) 150/74     Pulse Rate 05/04/19 1054 64     Resp 05/04/19 1054 17     Temp 05/04/19 1054 98.3 F (36.8 C)     Temp Source 05/04/19 1054 Oral     SpO2 05/04/19 1054 97 %      Weight 05/04/19 1055 200 lb (90.7 kg)     Height 05/04/19 1055 6' (1.829 m)     Head Circumference --      Peak Flow --      Pain Score 05/04/19 1055 0     Pain Loc --      Pain Edu? --      Excl. in [place]? --    Constitutional: Alert and oriented. Well appearing and in no distress. Eyes: Normal exam ENT      Head: Normocephalic and atraumatic.      Mouth/Throat: Mucous membranes are moist. Cardiovascular: Normal rate, regular rhythm.   Respiratory: Normal respiratory effort without tachypnea nor retractions. Breath sounds are clear  Gastrointestinal: Soft and nontender. No distention. Musculoskeletal: Nontender with normal range of motion in all extremities.  Neurologic:  Normal speech and language. No gross focal neurologic deficits  Skin:  Skin is warm, dry and intact.  Psychiatric: Mood and affect are normal.  ____________________________________________    EKG  EKG viewed and interpreted by myself shows a normal sinus rhythm at 70 bpm with a narrow QRS, normal axis, normal intervals, no concerning ST changes.  ____________________________________________    RADIOLOGY  IMPRESSION: 1. No dissection. No PE. No aortic aneurysm. 2. Atherosclerotic changes are noted throughout the thoracic and abdominal aorta. 3. No acute intra-abdominal abnormality. 4. Severe sigmoid diverticulosis without CT evidence of diverticulitis. 5. Enlarged prostate gland. Bilateral fat containing inguinal hernias are noted.  ____________________________________________   INITIAL IMPRESSION / ASSESSMENT AND PLAN / ED COURSE  Pertinent labs & imaging results that were available during my care of the patient were reviewed by me and considered in my medical decision making (see chart for details).   Patient presents emergency department for weakness referred by cardiology for further evaluation.  Patient had an echocardiogram performed today showing an aortic valve leak they were concerned for  possible aortic dissection referred the patient to the emergency department for CT angiography.  Patient denies any chest pain.  Reassuring vitals including blood pressure.  No shortness of breath.  He states 2 days ago he had a temperature at home of 99.7, but states no fever since.  States generalized fatigue/weakness over the past for 5 days.  Differential is quite broad but would include viral syndrome, electrolyte or metabolic abnormality, renal dysfunction, anemia, infectious etiology such as bacterial endocarditis, dissection.  We will proceed with CTA imaging of the chest abdomen and pelvis, we will check labs including blood cultures and continue to closely monitor.  We will obtain an EKG as well as a coronavirus swab.  Patient's work-up is essentially negative in the emergency department.  Remains afebrile.  CT scan is negative.  Lab work is largely [place].  Blood cultures have been sent as a precaution.  Patient will follow-up with his doctor.  [name] [name] was evaluated in Emergency Department on 05/04/2019 for the symptoms described in the history of present illness. He  was evaluated in the context of the global COVID-19 pandemic, which necessitated consideration that the patient might be at risk for infection with the SARS-CoV-2 virus that causes COVID-19. Institutional protocols and algorithms that pertain to the evaluation of patients at risk for COVID-19 are in a state of rapid change based on information released by regulatory bodies including the CDC and federal and state organizations. These policies and algorithms were followed during the patient's care in the ED.  ____________________________________________   FINAL CLINICAL IMPRESSION(S) / ED DIAGNOSES  Weakness   Harvest Dark, MD 05/04/19 1520

## 2019-05-04 NOTE — Telephone Encounter (Signed)
Spoke with the patient and wife in detail this evening over the phone. He will proceed with Zio and await echo read. We will monitor the blood cultures drawn in the ED today as well. He will need follow up in ~ 4 weeks once the Zio is resulted. For now, no medication changes. He is ok to return to work on Monday. I will type up a note indicating this.

## 2019-05-04 NOTE — Telephone Encounter (Signed)
Please call to discuss out come of his visit to the ED

## 2019-05-04 NOTE — ED Triage Notes (Signed)
Pt sent from cardiologist for a stat CT angio. States had an echo today due to having increased weakness for the past week and showed a leaking aortic valve. Pt denies any chest pain . Has a known arrhythmia

## 2019-05-04 NOTE — ED Notes (Signed)
Pt unhooked per request to use toilet in room. Pt updated that he was next have his CT scan

## 2019-05-05 ENCOUNTER — Telehealth: Payer: Self-pay | Admitting: Cardiovascular Disease

## 2019-05-05 NOTE — Telephone Encounter (Signed)
The patient was called by Christell Faith, PA. Results have been explained and he verbalized his understanding.

## 2019-05-05 NOTE — Telephone Encounter (Signed)
Patient calling  States that R Dunn was to call with ECHO results  Calling to check on status Please call to discuss

## 2019-05-05 NOTE — Telephone Encounter (Signed)
Call to patient to arrange 4-6 week f/u. Scheduled with Christell Faith, PA 7/1 (virtual).   Previous consent in the chart.   Obtained email to mail work letter.   Advised pt to call for any further questions or concerns.

## 2019-05-09 LAB — CULTURE, BLOOD (ROUTINE X 2)
Culture: NO GROWTH
Culture: NO GROWTH
Special Requests: ADEQUATE
Special Requests: ADEQUATE

## 2019-05-10 ENCOUNTER — Other Ambulatory Visit: Payer: Self-pay | Admitting: Cardiovascular Disease

## 2019-05-10 ENCOUNTER — Telehealth: Payer: Self-pay | Admitting: *Deleted

## 2019-05-10 MED ORDER — LEVOTHYROXINE SODIUM 25 MCG PO TABS: 25.0000 ug | ORAL_TABLET | Freq: Every day | ORAL | 1 refills | Status: DC

## 2019-05-10 NOTE — Telephone Encounter (Signed)
Give 2 refills but I can not give any more after that.

## 2019-05-10 NOTE — Telephone Encounter (Signed)
This needs to be filled/ managed by his PCP.   Thanks!

## 2019-05-10 NOTE — Telephone Encounter (Signed)
Attempted to contact the patient. I left a message on his cell # (ok per DPR) that we have sent in a refill for # 30 tablets for his synthroid w/ 1 refill.  I have advised that he will need to get any additional refills from his PCP per Dr. Fletcher Anon.  I asked that he call back with any further questions or concerns.

## 2019-05-10 NOTE — Telephone Encounter (Signed)
To Dr. Fletcher Anon to review for synthroid refill.

## 2019-05-10 NOTE — Telephone Encounter (Signed)
Spoke with pt he mentioned that he doesn't have a PCP at this time. He is currently in transition of switching to Dr.Fisher. He refused to contact [facility] office and proceeded to mentioned that he will not be seeing or contacting that office for any of his primary needs due to horrible service/horrible hours.  Pt mentioned that he was told at his last ov that if he didn't have a pcp by his last refill that Dr.Arida would fill his Rx for him. Please advise.

## 2019-05-10 NOTE — Telephone Encounter (Signed)
Please advise If ok to refill levothyroxin 25 mcg qd.

## 2019-05-11 ENCOUNTER — Ambulatory Visit (INDEPENDENT_AMBULATORY_CARE_PROVIDER_SITE_OTHER): Payer: Medicare Other

## 2019-05-11 DIAGNOSIS — R002 Palpitations: Secondary | ICD-10-CM | POA: Diagnosis not present

## 2019-05-12 ENCOUNTER — Telehealth: Payer: Medicare Other | Admitting: Cardiovascular Disease

## 2019-06-05 NOTE — H&P (View-Only) (Signed)
Virtual Visit via Video Note   This visit type was conducted due to national recommendations for restrictions regarding the COVID-19 Pandemic (e.g. social distancing) in an effort to limit this patient's exposure and mitigate transmission in our community.  Due to his co-morbid illnesses, this patient is at least at moderate risk for complications without adequate follow up.  This format is felt to be most appropriate for this patient at this time.  All issues noted in this document were discussed and addressed.  A limited physical exam was performed with this format.  Please refer to the patient's chart for his consent to telehealth for [facility].   Date:  06/05/2019   ID:  [name] [name], DOB December 18, 1950, MRN [id number]  Patient Location: Home Provider Location: Office  PCP:  Derinda Late, MD  Cardiologist:  Kathlyn Sacramento, MD  Electrophysiologist:  None   Evaluation Performed:  Follow-Up Visit  Chief Complaint:  Follow up  History of Present Illness:    [name] [name] is a 68 y.o. male with PAF diagnosed in 2004 not on [place] in the setting of CHADS2VASc of 1,  mild aortic valve regurgitation, hypothyroid on Synthroid, severe possibly familial hyperlipidemia, elevated liver function, and strong family history of premature coronary artery disease on his father side who presents for follow up of palpitations.  Patient was previously managed by [facility]. He was initially diagnosed with A. fib in 2004 and was managed with metoprolol initially though this was discontinued secondary to nightmares. Since then, he has been managed with diltiazem. Prior stress echo in 2013 was unremarkable. Holter monitor in 12/2014 showed the predominant rhythm was sinus with an average heart rate of 71 bpm, minimum heart rate 45 bpm, maximum heart rate 118 bpm rare isolated PVCs, frequent isolated PACs totaling 4587, 6 atrial couplets, 3 runs of SVT with the longest lasting 7  beats with a maximum rate of 141 bpm, no significant pauses. No evidence of A. Fib.He was seen in the office in 02/2018 and reported tolerating high-dose Crestor. He noted an episode of abdominal pain that was upper epigastric and right upper quadrant regions lasting for approximately 2 hours and radiated to his back with associated heartburn along with occasional chest aching with exertion. He underwent treadmill stress test on 03/16/2018 that showed no evidence of ischemia with good exercise capacity with an exercise duration of 7 minutes and 48 seconds. Peak blood pressure was 187/68. Motion artifact noted.  He called on 05/02/2019 noting palpitations. With this, he decreased his caffeine with some improvement in the palpitations. He was evaluated via telehealth on 05/03/2019 for increase in palpitations as well as fatigue, and chest pain. At that time he reported brief episodes of palpitations that he has attributed to Afib that are typically short-lived and without associated symptoms over the years. However, more recently dating back to 04/24/2019 he developed URI symptoms. He was taking OTC Mucinex with this and pushing fluids. He was seen at an outside urgent care on 04/28/2019 and tested negative for influenza as well as COVID-19. Around that same time he began to notice increase in palpitations that were stronger and longer lasting when compared to his typical episodes. With these episodes there was noted increased fatigue as well as chest tightness. Episodes of palpitations would last for several hours at a time, though were not constant. He did decrease his caffeine intake on 04/30/2019 with some noted improvement in palpitations, though not resolution. In telehealth follow up, he continued to  feel fatigued and note intermittent brief episodes of palpitations (obtained by self checking a carotid pulse).  He was seen in the office on 05/04/2019 for follow-up of the above and with preliminary echo report  from the technologist indicating possible aortic valve insufficiency he was referred to the ED with CTA of the chest/abdomen/pelvis indicating no dissection, aortic aneurysm, or PE as well as atherosclerotic changes noted throughout the thoracic abdominal aorta, mild to moderate coronary artery calcifications, no acute intra-abdominal abnormality, severe sigmoid diverticulosis without CT evidence of diverticulitis, and an enlarged prostate gland with bilateral fat-containing inguinal hernias.  COVID-19 test was again negative.  Blood cultures were no growth x2.  TSH normal.  Magnesium 2.2.  Troponin negative.  Potassium 3.9, serum creatinine 0.84, AST 56, ALT 61.  CBC unremarkable.  ZIO Patch monitoring pending.  Formal read of echo from 05/04/2019 showed an EF of 55-60%, mild concentric LVH, normal diastolic function, normal RVSF, grossly normal mitral and tricuspid valves, mild AI.    Patient is seen in telehealth follow-up today and is doing well from a cardiac perspective.  Since he was last seen, his symptoms have significantly improved.  He reports his energy is closer to baseline.  He has not had any further chest pain or palpitations.  He has mailed back his Zio monitor and we are currently awaiting this to be downloaded for our review.  He states while he was wearing this he did not have any significant palpitations or dizziness.  He does wonder if in the setting of his initial presenting symptoms as above if he was overdoing it as he was working extended hours.  Since returning back to normal hours he has done quite well.  The patient does not have symptoms concerning for COVID-19 infection (fever, chills, cough, or new shortness of breath).    Past Medical History:  Diagnosis Date  . Acquired hypothyroidism   . Paroxysmal atrial fibrillation (HCC)    Diagnosed in 2004  . Pure hypercholesterolemia    Past Surgical History:  Procedure Laterality Date  . MELANOMA EXCISION    . NECK SURGERY        No outpatient medications have been marked as taking for the 06/07/19 encounter (Appointment) with Rise Mu, PA-C.     Allergies:   Hydrocodone-acetaminophen and Vicodin [hydrocodone-acetaminophen]   Social History   Tobacco Use  . Smoking status: Never Smoker  . Smokeless tobacco: Never Used  Substance Use Topics  . Alcohol use: Yes    Comment: Occ.   . Drug use: No     Family Hx: The patient's family history includes Stroke in his father.  ROS:   Please see the history of present illness.     All other systems reviewed and are negative.   Prior CV studies:   The following studies were reviewed today:  2D Echo 05/04/2019: 1. The left ventricle has normal systolic function, with an ejection fraction of 55-60%. The cavity size was normal. There is mild concentric left ventricular hypertrophy. Left ventricular diastolic parameters were normal.  2. The right ventricle has normal systolic function. The cavity was normal. There is no increase in right ventricular wall thickness.  3. The mitral valve is grossly normal.  4. The tricuspid valve is grossly normal.  5. The aortic valve has an indeterminate number of cusps. Aortic valve regurgitation is mild by color flow Doppler. __________ CTA chest/ABD/pelvis 05/04/2019: 1. No dissection.  No PE.  No aortic aneurysm. 2. Atherosclerotic changes are  noted throughout the thoracic and abdominal aorta. 3. No acute intra-abdominal abnormality. 4. Severe sigmoid diverticulosis without CT evidence of diverticulitis. 5. Enlarged prostate gland. Bilateral fat containing inguinal hernias are noted. __________  Elwyn Reach pending   Labs/Other Tests and Data Reviewed:    EKG:  No ECG reviewed.  Recent Labs: 05/04/2019: ALT 61; BUN 22; Creatinine, Ser 0.84; Hemoglobin 14.9; Magnesium 2.2; Platelets 173; Potassium 3.9; Sodium 140; TSH 2.987   Recent Lipid Panel Lab Results  Component Value Date/Time   CHOL 145 05/11/2018 08:45 AM    TRIG 129 05/11/2018 08:45 AM   HDL 46 05/11/2018 08:45 AM   CHOLHDL 3.2 05/11/2018 08:45 AM   LDLCALC 73 05/11/2018 08:45 AM    Wt Readings from Last 3 Encounters:  05/04/19 200 lb (90.7 kg)  05/04/19 203 lb 12.8 oz (92.4 kg)  05/03/19 199 lb (90.3 kg)     Objective:    Vital Signs:  There were no vitals taken for this visit.   VITAL SIGNS:  reviewed GEN:  no acute distress EYES:  sclerae anicteric, EOMI - Extraocular Movements Intact  ASSESSMENT & PLAN:    1. Coronary artery calcification/chest pain: Symptoms of chest pain have resolved.  CTA of the chest did demonstrate mild to moderate coronary artery calcification.  Given the patient's initial presenting symptoms and in light of his coronary artery calcification we have agreed to proceed with ordering CTA for further evaluation.  Aggressive risk factor modification with continuation of aspirin and Crestor.  2. Palpitations/PAF/paroxysmal SVT/PACs: Significantly improved.  Awaiting results of ZIO monitor.  Continue diltiazem.  3. Familial hyperlipidemia: LDL of 73 from 05/2018.  Remains on Crestor and Zetia.  Most recent liver function testing in 04/2019 mildly elevated.  In follow-up this will need to be trended and if liver function continues to increase we will need to discontinue his statin.  COVID-19 Education: The signs and symptoms of COVID-19 were discussed with the patient and how to seek care for testing (follow up with PCP or arrange E-visit).  The importance of social distancing was discussed today.  Time:   Today, I have spent 20 minutes with the patient with telehealth technology discussing the above problems.     Medication Adjustments/Labs and Tests Ordered: Current medicines are reviewed at length with the patient today.  Concerns regarding medicines are outlined above.   Tests Ordered: No orders of the defined types were placed in this encounter.   Medication Changes: No orders of the defined types were  placed in this encounter.   Follow Up:  Virtual Visit in 2 month(s)  Signed, Christell Faith, PA-C  06/05/2019 1:42 PM    [facility]

## 2019-06-05 NOTE — H&P (View-Only) (Signed)
Virtual Visit via Video Note   This visit type was conducted due to national recommendations for restrictions regarding the COVID-19 Pandemic (e.g. social distancing) in an effort to limit this patient's exposure and mitigate transmission in our community.  Due to his co-morbid illnesses, this patient is at least at moderate risk for complications without adequate follow up.  This format is felt to be most appropriate for this patient at this time.  All issues noted in this document were discussed and addressed.  A limited physical exam was performed with this format.  Please refer to the patient's chart for his consent to telehealth for [facility].   Date:  06/05/2019   ID:  [name], DOB [birth year], MRN [id number]  Patient Location: Home Provider Location: Office  PCP:  Derinda Late, MD  Cardiologist:  Kathlyn Sacramento, MD  Electrophysiologist:  None   Evaluation Performed:  Follow-Up Visit  Chief Complaint:  Follow up  History of Present Illness:    [name] [name] is a 68 y.o. male with PAF diagnosed in 2004 not on [place] in the setting of CHADS2VASc of 1,  mild aortic valve regurgitation, hypothyroid on Synthroid, severe possibly familial hyperlipidemia, elevated liver function, and strong family history of premature coronary artery disease on his father side who presents for follow up of palpitations.  Patient was previously managed by [facility]. He was initially diagnosed with A. fib in 2004 and was managed with metoprolol initially though this was discontinued secondary to nightmares. Since then, he has been managed with diltiazem. Prior stress echo in 2013 was unremarkable. Holter monitor in 12/2014 showed the predominant rhythm was sinus with an average heart rate of 71 bpm, minimum heart rate 45 bpm, maximum heart rate 118 bpm rare isolated PVCs, frequent isolated PACs totaling 4587, 6 atrial couplets, 3 runs of SVT with the longest lasting 7  beats with a maximum rate of 141 bpm, no significant pauses. No evidence of A. Fib.He was seen in the office in 02/2018 and reported tolerating high-dose Crestor. He noted an episode of abdominal pain that was upper epigastric and right upper quadrant regions lasting for approximately 2 hours and radiated to his back with associated heartburn along with occasional chest aching with exertion. He underwent treadmill stress test on 03/16/2018 that showed no evidence of ischemia with good exercise capacity with an exercise duration of 7 minutes and 48 seconds. Peak blood pressure was 187/68. Motion artifact noted.  He called on 05/02/2019 noting palpitations. With this, he decreased his caffeine with some improvement in the palpitations. He was evaluated via telehealth on 05/03/2019 for increase in palpitations as well as fatigue, and chest pain. At that time he reported brief episodes of palpitations that he has attributed to Afib that are typically short-lived and without associated symptoms over the years. However, more recently dating back to 04/24/2019 he developed URI symptoms. He was taking OTC Mucinex with this and pushing fluids. He was seen at an outside urgent care on 04/28/2019 and tested negative for influenza as well as COVID-19. Around that same time he began to notice increase in palpitations that were stronger and longer lasting when compared to his typical episodes. With these episodes there was noted increased fatigue as well as chest tightness. Episodes of palpitations would last for several hours at a time, though were not constant. He did decrease his caffeine intake on 04/30/2019 with some noted improvement in palpitations, though not resolution. In telehealth follow up, he continued to  feel fatigued and note intermittent brief episodes of palpitations (obtained by self checking a carotid pulse).  He was seen in the office on 05/04/2019 for follow-up of the above and with preliminary echo report  from the technologist indicating possible aortic valve insufficiency he was referred to the ED with CTA of the chest/abdomen/pelvis indicating no dissection, aortic aneurysm, or PE as well as atherosclerotic changes noted throughout the thoracic abdominal aorta, mild to moderate coronary artery calcifications, no acute intra-abdominal abnormality, severe sigmoid diverticulosis without CT evidence of diverticulitis, and an enlarged prostate gland with bilateral fat-containing inguinal hernias.  COVID-19 test was again negative.  Blood cultures were no growth x2.  TSH normal.  Magnesium 2.2.  Troponin negative.  Potassium 3.9, serum creatinine 0.84, AST 56, ALT 61.  CBC unremarkable.  ZIO Patch monitoring pending.  Formal read of echo from 05/04/2019 showed an EF of 55-60%, mild concentric LVH, normal diastolic function, normal RVSF, grossly normal mitral and tricuspid valves, mild AI.    Patient is seen in telehealth follow-up today and is doing well from a cardiac perspective.  Since he was last seen, his symptoms have significantly improved.  He reports his energy is closer to baseline.  He has not had any further chest pain or palpitations.  He has mailed back his Zio monitor and we are currently awaiting this to be downloaded for our review.  He states while he was wearing this he did not have any significant palpitations or dizziness.  He does wonder if in the setting of his initial presenting symptoms as above if he was overdoing it as he was working extended hours.  Since returning back to normal hours he has done quite well.  The patient does not have symptoms concerning for COVID-19 infection (fever, chills, cough, or new shortness of breath).    Past Medical History:  Diagnosis Date  . Acquired hypothyroidism   . Paroxysmal atrial fibrillation (HCC)    Diagnosed in 2004  . Pure hypercholesterolemia    Past Surgical History:  Procedure Laterality Date  . MELANOMA EXCISION    . NECK SURGERY        No outpatient medications have been marked as taking for the 06/07/19 encounter (Appointment) with Rise Mu, PA-C.     Allergies:   Hydrocodone-acetaminophen and Vicodin [hydrocodone-acetaminophen]   Social History   Tobacco Use  . Smoking status: Never Smoker  . Smokeless tobacco: Never Used  Substance Use Topics  . Alcohol use: Yes    Comment: Occ.   . Drug use: No     Family Hx: The patient's family history includes Stroke in his father.  ROS:   Please see the history of present illness.     All other systems reviewed and are negative.   Prior CV studies:   The following studies were reviewed today:  2D Echo 05/04/2019: 1. The left ventricle has normal systolic function, with an ejection fraction of 55-60%. The cavity size was normal. There is mild concentric left ventricular hypertrophy. Left ventricular diastolic parameters were normal.  2. The right ventricle has normal systolic function. The cavity was normal. There is no increase in right ventricular wall thickness.  3. The mitral valve is grossly normal.  4. The tricuspid valve is grossly normal.  5. The aortic valve has an indeterminate number of cusps. Aortic valve regurgitation is mild by color flow Doppler. __________ CTA chest/ABD/pelvis 05/04/2019: 1. No dissection.  No PE.  No aortic aneurysm. 2. Atherosclerotic changes are  noted throughout the thoracic and abdominal aorta. 3. No acute intra-abdominal abnormality. 4. Severe sigmoid diverticulosis without CT evidence of diverticulitis. 5. Enlarged prostate gland. Bilateral fat containing inguinal hernias are noted. __________  Elwyn Reach pending   Labs/Other Tests and Data Reviewed:    EKG:  No ECG reviewed.  Recent Labs: 05/04/2019: ALT 61; BUN 22; Creatinine, Ser 0.84; Hemoglobin 14.9; Magnesium 2.2; Platelets 173; Potassium 3.9; Sodium 140; TSH 2.987   Recent Lipid Panel Lab Results  Component Value Date/Time   CHOL 145 05/11/2018 08:45 AM    TRIG 129 05/11/2018 08:45 AM   HDL 46 05/11/2018 08:45 AM   CHOLHDL 3.2 05/11/2018 08:45 AM   LDLCALC 73 05/11/2018 08:45 AM    Wt Readings from Last 3 Encounters:  05/04/19 200 lb (90.7 kg)  05/04/19 203 lb 12.8 oz (92.4 kg)  05/03/19 199 lb (90.3 kg)     Objective:    Vital Signs:  There were no vitals taken for this visit.   VITAL SIGNS:  reviewed GEN:  no acute distress EYES:  sclerae anicteric, EOMI - Extraocular Movements Intact  ASSESSMENT & PLAN:    1. Coronary artery calcification/chest pain: Symptoms of chest pain have resolved.  CTA of the chest did demonstrate mild to moderate coronary artery calcification.  Given the patient's initial presenting symptoms and in light of his coronary artery calcification we have agreed to proceed with ordering CTA for further evaluation.  Aggressive risk factor modification with continuation of aspirin and Crestor.  2. Palpitations/PAF/paroxysmal SVT/PACs: Significantly improved.  Awaiting results of ZIO monitor.  Continue diltiazem.  3. Familial hyperlipidemia: LDL of 73 from 05/2018.  Remains on Crestor and Zetia.  Most recent liver function testing in 04/2019 mildly elevated.  In follow-up this will need to be trended and if liver function continues to increase we will need to discontinue his statin.  COVID-19 Education: The signs and symptoms of COVID-19 were discussed with the patient and how to seek care for testing (follow up with PCP or arrange E-visit).  The importance of social distancing was discussed today.  Time:   Today, I have spent 20 minutes with the patient with telehealth technology discussing the above problems.     Medication Adjustments/Labs and Tests Ordered: Current medicines are reviewed at length with the patient today.  Concerns regarding medicines are outlined above.   Tests Ordered: No orders of the defined types were placed in this encounter.   Medication Changes: No orders of the defined types were  placed in this encounter.   Follow Up:  Virtual Visit in 2 month(s)  Signed, Christell Faith, PA-C  06/05/2019 1:42 PM    [facility]

## 2019-06-05 NOTE — Progress Notes (Signed)
Virtual Visit via Video Note   This visit type was conducted due to national recommendations for restrictions regarding the COVID-19 Pandemic (e.g. social distancing) in an effort to limit this patient's exposure and mitigate transmission in our community.  Due to his co-morbid illnesses, this patient is at least at moderate risk for complications without adequate follow up.  This format is felt to be most appropriate for this patient at this time.  All issues noted in this document were discussed and addressed.  A limited physical exam was performed with this format.  Please refer to the patient's chart for his consent to telehealth for [facility].   Date:  06/05/2019   ID:  [name] [name], DOB [birth year], MRN [id number]  Patient Location: Home Provider Location: Office  PCP:  Derinda Late, MD  Cardiologist:  Kathlyn Sacramento, MD  Electrophysiologist:  None   Evaluation Performed:  Follow-Up Visit  Chief Complaint:  Follow up  History of Present Illness:    [name] [name] is a 68 y.o. male with PAF diagnosed in 2004 not on [place] in the setting of CHADS2VASc of 1,  mild aortic valve regurgitation, hypothyroid on Synthroid, severe possibly familial hyperlipidemia, elevated liver function, and strong family history of premature coronary artery disease on his father side who presents for follow up of palpitations.  Patient was previously managed by [place]. He was initially diagnosed with A. fib in 2004 and was managed with metoprolol initially though this was discontinued secondary to nightmares. Since then, he has been managed with diltiazem. Prior stress echo in 2013 was unremarkable. Holter monitor in 12/2014 showed the predominant rhythm was sinus with an average heart rate of 71 bpm, minimum heart rate 45 bpm, maximum heart rate 118 bpm rare isolated PVCs, frequent isolated PACs totaling 4587, 6 atrial couplets, 3 runs of SVT with the longest lasting 7  beats with a maximum rate of 141 bpm, no significant pauses. No evidence of A. Fib.He was seen in the office in 02/2018 and reported tolerating high-dose Crestor. He noted an episode of abdominal pain that was upper epigastric and right upper quadrant regions lasting for approximately 2 hours and radiated to his back with associated heartburn along with occasional chest aching with exertion. He underwent treadmill stress test on 03/16/2018 that showed no evidence of ischemia with good exercise capacity with an exercise duration of 7 minutes and 48 seconds. Peak blood pressure was 187/68. Motion artifact noted.  He called on 05/02/2019 noting palpitations. With this, he decreased his caffeine with some improvement in the palpitations. He was evaluated via telehealth on 05/03/2019 for increase in palpitations as well as fatigue, and chest pain. At that time he reported brief episodes of palpitations that he has attributed to Afib that are typically short-lived and without associated symptoms over the years. However, more recently dating back to 04/24/2019 he developed URI symptoms. He was taking OTC Mucinex with this and pushing fluids. He was seen at an outside urgent care on 04/28/2019 and tested negative for influenza as well as COVID-19. Around that same time he began to notice increase in palpitations that were stronger and longer lasting when compared to his typical episodes. With these episodes there was noted increased fatigue as well as chest tightness. Episodes of palpitations would last for several hours at a time, though were not constant. He did decrease his caffeine intake on 04/30/2019 with some noted improvement in palpitations, though not resolution. In telehealth follow up, he continued to  feel fatigued and note intermittent brief episodes of palpitations (obtained by self checking a carotid pulse).  He was seen in the office on 05/04/2019 for follow-up of the above and with preliminary echo report  from the technologist indicating possible aortic valve insufficiency he was referred to the ED with CTA of the chest/abdomen/pelvis indicating no dissection, aortic aneurysm, or PE as well as atherosclerotic changes noted throughout the thoracic abdominal aorta, mild to moderate coronary artery calcifications, no acute intra-abdominal abnormality, severe sigmoid diverticulosis without CT evidence of diverticulitis, and an enlarged prostate gland with bilateral fat-containing inguinal hernias.  COVID-19 test was again negative.  Blood cultures were no growth x2.  TSH normal.  Magnesium 2.2.  Troponin negative.  Potassium 3.9, serum creatinine 0.84, AST 56, ALT 61.  CBC unremarkable.  ZIO Patch monitoring pending.  Formal read of echo from 05/04/2019 showed an EF of 55-60%, mild concentric LVH, normal diastolic function, normal RVSF, grossly normal mitral and tricuspid valves, mild AI.    Patient is seen in telehealth follow-up today and is doing well from a cardiac perspective.  Since he was last seen, his symptoms have significantly improved.  He reports his energy is closer to baseline.  He has not had any further chest pain or palpitations.  He has mailed back his Zio monitor and we are currently awaiting this to be downloaded for our review.  He states while he was wearing this he did not have any significant palpitations or dizziness.  He does wonder if in the setting of his initial presenting symptoms as above if he was overdoing it as he was working extended hours.  Since returning back to normal hours he has done quite well.  The patient does not have symptoms concerning for COVID-19 infection (fever, chills, cough, or new shortness of breath).    Past Medical History:  Diagnosis Date  . Acquired hypothyroidism   . Paroxysmal atrial fibrillation (HCC)    Diagnosed in 2004  . Pure hypercholesterolemia    Past Surgical History:  Procedure Laterality Date  . MELANOMA EXCISION    . NECK SURGERY        No outpatient medications have been marked as taking for the 06/07/19 encounter (Appointment) with Rise Mu, PA-C.     Allergies:   Hydrocodone-acetaminophen and Vicodin [hydrocodone-acetaminophen]   Social History   Tobacco Use  . Smoking status: Never Smoker  . Smokeless tobacco: Never Used  Substance Use Topics  . Alcohol use: Yes    Comment: Occ.   . Drug use: No     Family Hx: The patient's family history includes Stroke in his father.  ROS:   Please see the history of present illness.     All other systems reviewed and are negative.   Prior CV studies:   The following studies were reviewed today:  2D Echo 05/04/2019: 1. The left ventricle has normal systolic function, with an ejection fraction of 55-60%. The cavity size was normal. There is mild concentric left ventricular hypertrophy. Left ventricular diastolic parameters were normal.  2. The right ventricle has normal systolic function. The cavity was normal. There is no increase in right ventricular wall thickness.  3. The mitral valve is grossly normal.  4. The tricuspid valve is grossly normal.  5. The aortic valve has an indeterminate number of cusps. Aortic valve regurgitation is mild by color flow Doppler. __________ CTA chest/ABD/pelvis 05/04/2019: 1. No dissection.  No PE.  No aortic aneurysm. 2. Atherosclerotic changes are  noted throughout the thoracic and abdominal aorta. 3. No acute intra-abdominal abnormality. 4. Severe sigmoid diverticulosis without CT evidence of diverticulitis. 5. Enlarged prostate gland. Bilateral fat containing inguinal hernias are noted. __________  Elwyn Reach pending   Labs/Other Tests and Data Reviewed:    EKG:  No ECG reviewed.  Recent Labs: 05/04/2019: ALT 61; BUN 22; Creatinine, Ser 0.84; Hemoglobin 14.9; Magnesium 2.2; Platelets 173; Potassium 3.9; Sodium 140; TSH 2.987   Recent Lipid Panel Lab Results  Component Value Date/Time   CHOL 145 05/11/2018 08:45 AM    TRIG 129 05/11/2018 08:45 AM   HDL 46 05/11/2018 08:45 AM   CHOLHDL 3.2 05/11/2018 08:45 AM   LDLCALC 73 05/11/2018 08:45 AM    Wt Readings from Last 3 Encounters:  05/04/19 200 lb (90.7 kg)  05/04/19 203 lb 12.8 oz (92.4 kg)  05/03/19 199 lb (90.3 kg)     Objective:    Vital Signs:  There were no vitals taken for this visit.   VITAL SIGNS:  reviewed GEN:  no acute distress EYES:  sclerae anicteric, EOMI - Extraocular Movements Intact  ASSESSMENT & PLAN:    1. Coronary artery calcification/chest pain: Symptoms of chest pain have resolved.  CTA of the chest did demonstrate mild to moderate coronary artery calcification.  Given the patient's initial presenting symptoms and in light of his coronary artery calcification we have agreed to proceed with ordering CTA for further evaluation.  Aggressive risk factor modification with continuation of aspirin and Crestor.  2. Palpitations/PAF/paroxysmal SVT/PACs: Significantly improved.  Awaiting results of ZIO monitor.  Continue diltiazem.  3. Familial hyperlipidemia: LDL of 73 from 05/2018.  Remains on Crestor and Zetia.  Most recent liver function testing in 04/2019 mildly elevated.  In follow-up this will need to be trended and if liver function continues to increase we will need to discontinue his statin.  COVID-19 Education: The signs and symptoms of COVID-19 were discussed with the patient and how to seek care for testing (follow up with PCP or arrange E-visit).  The importance of social distancing was discussed today.  Time:   Today, I have spent 20 minutes with the patient with telehealth technology discussing the above problems.     Medication Adjustments/Labs and Tests Ordered: Current medicines are reviewed at length with the patient today.  Concerns regarding medicines are outlined above.   Tests Ordered: No orders of the defined types were placed in this encounter.   Medication Changes: No orders of the defined types were  placed in this encounter.   Follow Up:  Virtual Visit in 2 month(s)  Signed, Christell Faith, PA-C  06/05/2019 1:42 PM    [facility]

## 2019-06-07 ENCOUNTER — Telehealth (INDEPENDENT_AMBULATORY_CARE_PROVIDER_SITE_OTHER): Payer: Medicare Other | Admitting: Physician Assistant

## 2019-06-07 ENCOUNTER — Other Ambulatory Visit: Payer: Self-pay

## 2019-06-07 ENCOUNTER — Telehealth: Payer: Self-pay

## 2019-06-07 VITALS — BP 128/63 | HR 70 | Ht 72.0 in | Wt 199.0 lb

## 2019-06-07 DIAGNOSIS — I471 Supraventricular tachycardia: Secondary | ICD-10-CM

## 2019-06-07 DIAGNOSIS — R002 Palpitations: Secondary | ICD-10-CM

## 2019-06-07 DIAGNOSIS — R079 Chest pain, unspecified: Secondary | ICD-10-CM | POA: Diagnosis not present

## 2019-06-07 DIAGNOSIS — I2584 Coronary atherosclerosis due to calcified coronary lesion: Secondary | ICD-10-CM

## 2019-06-07 DIAGNOSIS — I480 Paroxysmal atrial fibrillation: Secondary | ICD-10-CM

## 2019-06-07 DIAGNOSIS — E785 Hyperlipidemia, unspecified: Secondary | ICD-10-CM

## 2019-06-07 DIAGNOSIS — I2510 Atherosclerotic heart disease of native coronary artery without angina pectoris: Secondary | ICD-10-CM | POA: Diagnosis not present

## 2019-06-07 DIAGNOSIS — I491 Atrial premature depolarization: Secondary | ICD-10-CM

## 2019-06-07 NOTE — Telephone Encounter (Signed)
Call to patient to discuss CT order and AVS.   Pt reported that he was going into a meeting and I would need to call back later. I did make patient aware that he would be called for CT scan.    I will also be mailing to him.

## 2019-06-07 NOTE — Patient Instructions (Addendum)
It was a pleasure to speak with you on the phone today! Thank you for allowing [place] to continue taking care of your [facility] needs during this time.   Feel free to call as needed for questions and concerns related to your cardiac needs.   Medication Instructions:  Your physician recommends that you continue on your current medications as directed. Please refer to the Current Medication list given to you today.  If you need a refill on your cardiac medications before your next appointment, please call your pharmacy.   Lab work: Your physician recommends that you return for lab work (within 30 days of scheduled CT) at the medical mall. No appt is needed. Hours are M-F 7AM- 6 PM.  If you have labs (blood work) drawn today and your tests are completely normal, you will receive your results only by: Marland Kitchen MyChart Message (if you have MyChart) OR . A paper copy in the mail If you have any lab test that is abnormal or we need to change your treatment, we will call you to review the results.  Testing/Procedures: 1- Coronary CT  Scheduling will call you to make appt. They will give you all details at that time.   Please follow these instructions carefully (unless otherwise directed):  Hold all erectile dysfunction medications at least 48 hours prior to test.  On the Night Before the Test: . Be sure to Drink plenty of water. . Do not consume any caffeinated/decaffeinated beverages or chocolate 12 hours prior to your test. . Do not take any antihistamines 12 hours prior to your test.  On the Day of the Test: . Drink plenty of water. Do not drink any water within one hour of the test. . Do not eat any food 4 hours prior to the test. . You may take your regular medications prior to the test.        After the Test: . Drink plenty of water. . After receiving IV contrast, you may experience a mild flushed feeling. This is normal. . On occasion, you may experience a mild rash up to 24 hours after the  test. This is not dangerous. If this occurs, you can take Benadryl 25 mg and increase your fluid intake. . If you experience trouble breathing, this can be serious. If it is severe call 911 IMMEDIATELY. If it is mild, please call our office. . If you take any of these medications: Glipizide/Metformin, Avandament, Glucavance, please do not take 48 hours after completing test.   Follow-Up: At [facility], you and your health needs are our priority.  As part of our continuing mission to provide you with exceptional heart care, we have created designated Provider Care Teams.  These Care Teams include your primary Cardiologist (physician) and Advanced Practice Providers (APPs -  Physician Assistants and Nurse Practitioners) who all work together to provide you with the care you need, when you need it. You will need a follow up appointment in 2 months.  You may see Kathlyn Sacramento, MD or Christell Faith, PA-C.

## 2019-06-08 ENCOUNTER — Other Ambulatory Visit: Payer: Self-pay

## 2019-06-12 NOTE — Telephone Encounter (Signed)
Calling pt to confirm that he did not have any further question regarding CT instructions that were mailed to him.   Attempted to call patient. LMTCB 06/12/2019  Advised pt to call for any further questions or concerns.

## 2019-06-13 ENCOUNTER — Telehealth: Payer: Self-pay | Admitting: *Deleted

## 2019-06-13 NOTE — Telephone Encounter (Signed)
-----   Message from Rise Mu, PA-C sent at 06/12/2019  1:47 PM EDT ----- Heart monitor showed NSR with an average heart rate of 73 bpm. There were 27 episodes of SVT with the longest lasting 13 beats with an average rate of 113 bpm and the fastest interval lasting 6 beats with a maximum rate of 158 bpm. These episodes may have been more pronounced and what he was feeling when he was more symptomatic prior to wearing the monitor. If he would like, I can go up on his diltiazem to 240 mg daily in an effort to reduce the atrial arrhythmia burden, or if he is pleased currently, we can keep him on the same dose and monitor with planned escalation of diltiazem if symptoms return.

## 2019-06-13 NOTE — Telephone Encounter (Signed)
Results called to pt. Pt verbalized understanding. He is pleased with how he is feeling at this time and would like to continue to monitor.  He will call us back if anything changes. He is aware someone will call him about scheduling the CT coronary. He is aware to read over AVS when he gets it in the mail and that he will need lab work prior to.

## 2019-06-14 ENCOUNTER — Telehealth (HOSPITAL_COMMUNITY): Payer: Self-pay | Admitting: *Deleted

## 2019-06-14 NOTE — Telephone Encounter (Signed)
Called patient to offer assistance regarding upcoming cardiac imaging study; pt verbalizes understanding of appt date/time, parking situation and where to check in, pre-test NPO status and medications ordered, and verified current allergies; name and call back number provided for further questions should they arise.  Verified pharmacy to call in medication

## 2019-06-14 NOTE — Telephone Encounter (Signed)
Called and left message for Metoprolol 50mg  1 tablet take 2 hours prior to exam.  Order from Ena Dawley MD

## 2019-06-16 ENCOUNTER — Other Ambulatory Visit: Payer: Self-pay

## 2019-06-16 ENCOUNTER — Ambulatory Visit
Admission: RE | Admit: 2019-06-16 | Discharge: 2019-06-16 | Disposition: A | Discharge status: Home / Self Care | Payer: Medicare Other | Source: Ambulatory Visit | Attending: Physician Assistant | Admitting: Physician Assistant

## 2019-06-16 DIAGNOSIS — R079 Chest pain, unspecified: Secondary | ICD-10-CM | POA: Diagnosis not present

## 2019-06-16 DIAGNOSIS — I2510 Atherosclerotic heart disease of native coronary artery without angina pectoris: Secondary | ICD-10-CM

## 2019-06-16 LAB — POCT I-STAT CREATININE: Creatinine, Ser: 0.9 mg/dL (ref 0.61–1.24)

## 2019-06-16 MED ORDER — METOPROLOL TARTRATE 5 MG/5ML IV SOLN
5.0000 mg | Freq: Once | INTRAVENOUS | Status: AC
  Administered 2019-06-16: 5 mg via INTRAVENOUS

## 2019-06-16 MED ORDER — IOHEXOL 350 MG/ML SOLN
100.0000 mL | Freq: Once | INTRAVENOUS | Status: AC | PRN
  Administered 2019-06-16: 100 mL via INTRAVENOUS

## 2019-06-16 MED ORDER — NITROGLYCERIN 0.4 MG SL SUBL
0.8000 mg | SUBLINGUAL_TABLET | Freq: Once | SUBLINGUAL | Status: AC
  Administered 2019-06-16: 0.8 mg via SUBLINGUAL

## 2019-06-16 NOTE — Progress Notes (Signed)
Patient tolerated CT without incident. Did not want anything to eat or drink. Ambulatory steady gait to exit.

## 2019-06-20 ENCOUNTER — Telehealth: Payer: Self-pay | Admitting: Cardiovascular Disease

## 2019-06-20 ENCOUNTER — Other Ambulatory Visit: Payer: Self-pay | Admitting: Physician Assistant

## 2019-06-20 ENCOUNTER — Ambulatory Visit: Payer: Medicare Other

## 2019-06-20 DIAGNOSIS — I200 Unstable angina: Secondary | ICD-10-CM

## 2019-06-20 DIAGNOSIS — I2510 Atherosclerotic heart disease of native coronary artery without angina pectoris: Secondary | ICD-10-CM | POA: Diagnosis not present

## 2019-06-20 NOTE — Telephone Encounter (Signed)
Patient returning call from [facility] for CT results

## 2019-06-20 NOTE — Telephone Encounter (Signed)
FFR is awaiting review and signature of the provider. will fwd to Christell Faith, PA

## 2019-06-20 NOTE — Telephone Encounter (Signed)
Spoke with the pt. Pt cardiac cath is scheduled for 06/26/19 @ 9:30am with Dr.End Pt made aware of pre-procedure cardiac cath instructions.  -Pt will have pre-procedure labs (bmet, cbc) at the medical mall on 06/23/19. -Pt will go through the COVID testing drive through for his COVID test after his labs. Pt will self quarantine after test is administered up to his procedure date. -PT adv that he should be NPO after midnight -Morning medications with just a sip of water -Pt adv to arrive 1 hour prior to the procedure -PT adv that he will need someone to drive him home -PT adv that as of now starting on 06/26/19 [facility] will be allowing one support person to accompany him on the day of the procedure. Pt adv that the date has been pushed out once before and can possibly be extended again. Adv the pt that we will try to contact him if that occurs.  Pt adv to seek emergent care if cardiac symptoms occur.  Pt verbalized understanding to the instruction and has no questions or concerns at this time.

## 2019-06-20 NOTE — Telephone Encounter (Signed)
Discussed positive FFR results with patient in detail.  We will plan to swab the patient for COVID-19 this week with outpatient diagnostic cardiac cath on 06/26/2019.  Of note, the patient was originally planning to fly to [place] for his mother's 90th birthday on Saturday, 06/24/2019 and drive back on Monday, 06/26/2019.  Given the above abnormal FFR results I have recommended he cancel this trip and he agrees to do so.  He will isolate following his COVID test later this week.  I have taken him out of work for the remainder of this week and all of next week with initial plans being for the patient to return back to work on 07/03/2019.  He will continue aspirin, Crestor, and Zetia.  Risks and benefits of cardiac catheterization have been discussed with the patient including risks of bleeding, bruising, infection, kidney damage, stroke, heart attack, and death. The patient understands these risks and is willing to proceed with the procedure. All questions have been answered and concerns listened to.   Triage, please get the patient set up for left heart cath with Dr. end on 06/26/2019.  I will place case request and precath orders.  Please contact patient to notify of time of arrival for COVID-19 testing this week as well as time of arrival for cath.

## 2019-06-20 NOTE — Progress Notes (Signed)
Case request, COVID-19 testing, and cath orders have been placed.

## 2019-06-22 ENCOUNTER — Other Ambulatory Visit
Admission: RE | Admit: 2019-06-22 | Discharge: 2019-06-22 | Disposition: A | Discharge status: Home / Self Care | Payer: Medicare Other | Source: Ambulatory Visit | Attending: Physician Assistant | Admitting: Physician Assistant

## 2019-06-22 ENCOUNTER — Other Ambulatory Visit: Payer: Self-pay

## 2019-06-22 DIAGNOSIS — Z1159 Encounter for screening for other viral diseases: Secondary | ICD-10-CM | POA: Insufficient documentation

## 2019-06-22 DIAGNOSIS — I200 Unstable angina: Secondary | ICD-10-CM | POA: Diagnosis present

## 2019-06-22 LAB — CBC WITH DIFFERENTIAL/PLATELET
Abs Immature Granulocytes: 0.01 10*3/uL (ref 0.00–0.07)
Basophils Absolute: 0 10*3/uL (ref 0.0–0.1)
Basophils Relative: 0 %
Eosinophils Absolute: 0.1 10*3/uL (ref 0.0–0.5)
Eosinophils Relative: 1 %
HCT: 42.9 % (ref 39.0–52.0)
Hemoglobin: 14.3 g/dL (ref 13.0–17.0)
Immature Granulocytes: 0 %
Lymphocytes Relative: 34 %
Lymphs Abs: 1.7 10*3/uL (ref 0.7–4.0)
MCH: 29.7 pg (ref 26.0–34.0)
MCHC: 33.3 g/dL (ref 30.0–36.0)
MCV: 89 fL (ref 80.0–100.0)
Monocytes Absolute: 0.3 10*3/uL (ref 0.1–1.0)
Monocytes Relative: 7 %
Neutro Abs: 2.8 10*3/uL (ref 1.7–7.7)
Neutrophils Relative %: 58 %
Platelets: 155 10*3/uL (ref 150–400)
RBC: 4.82 MIL/uL (ref 4.22–5.81)
RDW: 13.5 % (ref 11.5–15.5)
WBC: 4.9 10*3/uL (ref 4.0–10.5)
nRBC: 0 % (ref 0.0–0.2)

## 2019-06-22 LAB — BASIC METABOLIC PANEL
Anion gap: 8 (ref 5–15)
BUN: 20 mg/dL (ref 8–23)
CO2: 25 mmol/L (ref 22–32)
Calcium: 9.9 mg/dL (ref 8.9–10.3)
Chloride: 106 mmol/L (ref 98–111)
Creatinine, Ser: 0.82 mg/dL (ref 0.61–1.24)
GFR calc Af Amer: >60 mL/min (ref >60)
GFR calc non Af Amer: >60 mL/min (ref >60)
Glucose, Bld: 116 mg/dL — ABNORMAL HIGH (ref 70–99)
Potassium: 3.8 mmol/L (ref 3.5–5.1)
Sodium: 139 mmol/L (ref 135–145)

## 2019-06-22 LAB — SARS CORONAVIRUS 2 (TAT 6-24 HRS): SARS Coronavirus 2: NEGATIVE

## 2019-06-22 NOTE — Addendum Note (Signed)
Addended by: Lattie Haw on: 06/22/2019 09:45 AM   Modules accepted: Orders

## 2019-06-22 NOTE — Addendum Note (Signed)
Addended by: Lattie Haw on: 06/22/2019 09:44 AM   Modules accepted: Orders

## 2019-06-26 ENCOUNTER — Other Ambulatory Visit: Payer: Self-pay

## 2019-06-26 ENCOUNTER — Other Ambulatory Visit: Payer: Self-pay | Admitting: Internal Medicine

## 2019-06-26 ENCOUNTER — Ambulatory Visit
Admission: RE | Admit: 2019-06-26 | Discharge: 2019-06-26 | Disposition: A | Discharge status: Home / Self Care | Payer: Medicare Other | Attending: Internal Medicine | Admitting: Internal Medicine

## 2019-06-26 ENCOUNTER — Encounter
Admission: RE | Disposition: A | Discharge status: Home / Self Care | Payer: Medicare Other | Source: Home / Self Care | Attending: Internal Medicine

## 2019-06-26 DIAGNOSIS — E7849 Other hyperlipidemia: Secondary | ICD-10-CM | POA: Diagnosis not present

## 2019-06-26 DIAGNOSIS — Z885 Allergy status to narcotic agent status: Secondary | ICD-10-CM | POA: Insufficient documentation

## 2019-06-26 DIAGNOSIS — I2584 Coronary atherosclerosis due to calcified coronary lesion: Secondary | ICD-10-CM | POA: Diagnosis not present

## 2019-06-26 DIAGNOSIS — I480 Paroxysmal atrial fibrillation: Secondary | ICD-10-CM | POA: Diagnosis not present

## 2019-06-26 DIAGNOSIS — I25110 Atherosclerotic heart disease of native coronary artery with unstable angina pectoris: Secondary | ICD-10-CM | POA: Insufficient documentation

## 2019-06-26 DIAGNOSIS — I200 Unstable angina: Secondary | ICD-10-CM

## 2019-06-26 DIAGNOSIS — Z823 Family history of stroke: Secondary | ICD-10-CM | POA: Diagnosis not present

## 2019-06-26 DIAGNOSIS — E039 Hypothyroidism, unspecified: Secondary | ICD-10-CM | POA: Insufficient documentation

## 2019-06-26 DIAGNOSIS — I471 Supraventricular tachycardia: Secondary | ICD-10-CM | POA: Diagnosis not present

## 2019-06-26 HISTORY — PX: LEFT HEART CATH AND CORONARY ANGIOGRAPHY: CATH118249

## 2019-06-26 SURGERY — LEFT HEART CATH AND CORONARY ANGIOGRAPHY
Anesthesia: Moderate Sedation | Laterality: Left

## 2019-06-26 MED ORDER — HEPARIN (PORCINE) IN NACL 1000-0.9 UT/500ML-% IV SOLN
INTRAVENOUS | Status: DC | PRN
  Administered 2019-06-26: 500 mL

## 2019-06-26 MED ORDER — SODIUM CHLORIDE 0.9 % IV SOLN: 250.0000 mL | INTRAVENOUS | Status: DC | PRN

## 2019-06-26 MED ORDER — VERAPAMIL HCL 2.5 MG/ML IV SOLN
INTRAVENOUS | Status: DC | PRN
  Administered 2019-06-26: 2.5 mg via INTRA_ARTERIAL

## 2019-06-26 MED ORDER — SODIUM CHLORIDE 0.9% FLUSH: 3.0000 mL | Freq: Two times a day (BID) | INTRAVENOUS | Status: DC

## 2019-06-26 MED ORDER — HEPARIN SODIUM (PORCINE) 1000 UNIT/ML IJ SOLN
INTRAMUSCULAR | Status: AC
  Filled 2019-06-26: qty 1

## 2019-06-26 MED ORDER — LABETALOL HCL 5 MG/ML IV SOLN: 10.0000 mg | INTRAVENOUS | Status: DC | PRN

## 2019-06-26 MED ORDER — SODIUM CHLORIDE 0.9% FLUSH: 3.0000 mL | INTRAVENOUS | Status: DC | PRN

## 2019-06-26 MED ORDER — ACETAMINOPHEN 325 MG PO TABS: 650.0000 mg | ORAL_TABLET | ORAL | Status: DC | PRN

## 2019-06-26 MED ORDER — SODIUM CHLORIDE 0.9 % WEIGHT BASED INFUSION: 1.0000 mL/kg/h | INTRAVENOUS | Status: DC

## 2019-06-26 MED ORDER — HEPARIN SODIUM (PORCINE) 1000 UNIT/ML IJ SOLN
INTRAMUSCULAR | Status: DC | PRN
  Administered 2019-06-26: 4500 [IU] via INTRAVENOUS

## 2019-06-26 MED ORDER — SODIUM CHLORIDE 0.9 % WEIGHT BASED INFUSION
3.0000 mL/kg/h | INTRAVENOUS | Status: AC
  Administered 2019-06-26: 3 mL/kg/h via INTRAVENOUS

## 2019-06-26 MED ORDER — FENTANYL CITRATE (PF) 100 MCG/2ML IJ SOLN
INTRAMUSCULAR | Status: AC
  Filled 2019-06-26: qty 2

## 2019-06-26 MED ORDER — CLOPIDOGREL BISULFATE 75 MG PO TABS: 75.0000 mg | ORAL_TABLET | Freq: Every day | ORAL | 11 refills | Status: DC

## 2019-06-26 MED ORDER — MIDAZOLAM HCL 2 MG/2ML IJ SOLN
INTRAMUSCULAR | Status: AC
  Filled 2019-06-26: qty 2

## 2019-06-26 MED ORDER — ONDANSETRON HCL 4 MG/2ML IJ SOLN: 4.0000 mg | Freq: Four times a day (QID) | INTRAMUSCULAR | Status: DC | PRN

## 2019-06-26 MED ORDER — CLOPIDOGREL BISULFATE 75 MG PO TABS
ORAL_TABLET | ORAL | Status: AC
  Filled 2019-06-26: qty 4

## 2019-06-26 MED ORDER — SODIUM CHLORIDE 0.9 % IV SOLN: INTRAVENOUS | Status: DC

## 2019-06-26 MED ORDER — CLOPIDOGREL BISULFATE 300 MG PO TABS
300.0000 mg | ORAL_TABLET | Freq: Once | ORAL | Status: AC
  Administered 2019-06-26: 300 mg via ORAL

## 2019-06-26 MED ORDER — MIDAZOLAM HCL 2 MG/2ML IJ SOLN
INTRAMUSCULAR | Status: DC | PRN
  Administered 2019-06-26: 1 mg via INTRAVENOUS

## 2019-06-26 MED ORDER — HYDRALAZINE HCL 20 MG/ML IJ SOLN: 10.0000 mg | INTRAMUSCULAR | Status: DC | PRN

## 2019-06-26 MED ORDER — NITROGLYCERIN 0.4 MG SL SUBL: 0.4000 mg | SUBLINGUAL_TABLET | SUBLINGUAL | 3 refills | Status: AC | PRN

## 2019-06-26 MED ORDER — FENTANYL CITRATE (PF) 100 MCG/2ML IJ SOLN
INTRAMUSCULAR | Status: DC | PRN
  Administered 2019-06-26: 50 ug via INTRAVENOUS

## 2019-06-26 MED ORDER — HEPARIN (PORCINE) IN NACL 1000-0.9 UT/500ML-% IV SOLN
INTRAVENOUS | Status: AC
  Filled 2019-06-26: qty 1000

## 2019-06-26 MED ORDER — IOHEXOL 300 MG/ML  SOLN
INTRAMUSCULAR | Status: DC | PRN
  Administered 2019-06-26: 70 mL via INTRAVENOUS

## 2019-06-26 MED ORDER — VERAPAMIL HCL 2.5 MG/ML IV SOLN
INTRAVENOUS | Status: AC
  Filled 2019-06-26: qty 2

## 2019-06-26 SURGICAL SUPPLY — 9 items
CATH 5F 110X4 TIG (CATHETERS) ×1 IMPLANT
CATH INFINITI 5FR ANG PIGTAIL (CATHETERS) ×1 IMPLANT
CATH INFINITI JR4 5F (CATHETERS) ×1 IMPLANT
DEVICE RAD TR BAND REGULAR (VASCULAR PRODUCTS) ×1 IMPLANT
GLIDESHEATH SLEND SS 6F .021 (SHEATH) ×1 IMPLANT
KIT MANI 3VAL PERCEP (MISCELLANEOUS) ×2 IMPLANT
PACK CARDIAC CATH (CUSTOM PROCEDURE TRAY) ×2 IMPLANT
WIRE HITORQ VERSACORE ST 145CM (WIRE) ×1 IMPLANT
WIRE ROSEN-J .035X260CM (WIRE) ×1 IMPLANT

## 2019-06-26 NOTE — Progress Notes (Signed)
Dr. Saunders Revel at bedside, speaking with pt. And explaining entire cath procedure to pt. And reviewing CCTA. Pt. Verbalizing understanding of complete conversation.

## 2019-06-26 NOTE — Progress Notes (Signed)
Dr. Saunders Revel spoke with pt. And wife (in waiting room secondary to COVID-19) RE: Cath results and future atherectomy next Wed. July 29th. Both verbalize understanding of conversation.

## 2019-06-26 NOTE — Brief Op Note (Signed)
BRIEF CARDIAC CATHETERIZATION NOTE  06/26/2019  10:58 AM  PATIENT:  [name] [name]  68 y.o. male  PRE-OPERATIVE DIAGNOSIS: Accelerating angina with abnormal cardiac CTA  POST-OPERATIVE DIAGNOSIS:  Same  PROCEDURE:  Procedure(s): LEFT HEART CATH AND CORONARY ANGIOGRAPHY (Left)  FINDINGS: 1. Severe single-vessel CAD with 80-90% proximal LAD stenosis with heavy calcification. 2. Mild to moderate, non-obstructive CAD involving mid LAD, mid LCx, and mid RCA. 3. Elevated LVEDP with normal LVEF.  RECOMMENDATIONS: 1. Plan for staged PCI to proximal LAD at [facility] using atherectomy.  Will start patient on DAPT with aspirin and clopidogrel. 2. Aggressive secondary prevention.  Nelva Bush, MD [facility] Pager: [phone]

## 2019-06-26 NOTE — Interval H&P Note (Signed)
History and Physical Interval Note:  06/26/2019 9:38 AM  [name] [name]  has presented today for cardiac catheterization, with the diagnosis of accelerating angina with abnormal coronary computed tomography angiography study with positive fractional flow reserve.  The various methods of treatment have been discussed with the patient and family. After consideration of risks, benefits and other options for treatment, the patient has consented to  Procedure(s): LEFT HEART CATH AND CORONARY ANGIOGRAPHY (Left) as a surgical intervention.  The patient's history has been reviewed, patient examined, no change in status, stable for surgery.  I have reviewed the patient's chart and labs.  Questions were answered to the patient's satisfaction.    Cath Lab Visit (complete for each Cath Lab visit)  Clinical Evaluation Leading to the Procedure:   ACS: No.  Non-ACS:    Anginal Classification: CCS III  Anti-ischemic medical therapy: Minimal Therapy (1 class of medications)  Non-Invasive Test Results: High-risk stress test findings: cardiac mortality >3%/year (3-vessel CAD by cardiac CTA)  Prior CABG: No previous CABG  [name] [name]

## 2019-06-27 ENCOUNTER — Other Ambulatory Visit: Payer: Self-pay | Admitting: *Deleted

## 2019-06-27 ENCOUNTER — Telehealth: Payer: Self-pay | Admitting: *Deleted

## 2019-06-27 DIAGNOSIS — I2584 Coronary atherosclerosis due to calcified coronary lesion: Secondary | ICD-10-CM

## 2019-06-27 DIAGNOSIS — Z01812 Encounter for preprocedural laboratory examination: Secondary | ICD-10-CM

## 2019-06-27 DIAGNOSIS — I2510 Atherosclerotic heart disease of native coronary artery without angina pectoris: Secondary | ICD-10-CM

## 2019-06-27 NOTE — Telephone Encounter (Signed)
Pt contacted pre-coronary atherectomy scheduled at [facility] for: Wednesday July 05, 2019 10:30 AM Verified arrival time and place: [place] [facility] A at: 8:30 AM  Pre procedure BMP/CBC - Friday July 24 [place]. Covid-19 test date: after BMP/CBC July 24 -[facility]-pt knows he will need to self quarantine after Covid test until procedure.  No solid food after midnight prior to cath, clear liquids until 5 AM day of procedure. Contrast allergy: no  AM meds can be  taken pre-cath with sip of water including: ASA 81 mg Plavix 75 mg  Confirmed patient has responsible person to drive home post procedure and observe 24 hours after arriving home: yes   Due to Covid-19 pandemic, only one support person will be allowed with patient. Must be the same support person for that patient's entire stay. On arrival the support person will be required to wear a mask and will be screened, including having temporal temperature checked (below 100.4 to be cleared). They will be required to wait in the [facility] waiting room for the duration of the procedure. To limit exposure, MDs will continue to call support person after the procedure instead of speaking with them in consult room.  Patients are required to wear a mask when they enter the hospital.   I reviewed procedure mask/visitor instructions with patient, he verbalized understanding, thanked me for call.

## 2019-06-30 ENCOUNTER — Other Ambulatory Visit: Admission: RE | Admit: 2019-06-30 | Payer: Medicare Other | Source: Home / Self Care | Admitting: Internal Medicine

## 2019-06-30 ENCOUNTER — Other Ambulatory Visit
Admission: RE | Admit: 2019-06-30 | Discharge: 2019-06-30 | Disposition: A | Discharge status: Home / Self Care | Payer: Medicare Other | Source: Home / Self Care | Attending: Internal Medicine | Admitting: Internal Medicine

## 2019-06-30 ENCOUNTER — Other Ambulatory Visit
Admission: RE | Admit: 2019-06-30 | Discharge: 2019-06-30 | Disposition: A | Discharge status: Home / Self Care | Payer: Medicare Other | Source: Ambulatory Visit | Attending: Cardiovascular Disease | Admitting: Cardiovascular Disease

## 2019-06-30 ENCOUNTER — Other Ambulatory Visit: Payer: Self-pay

## 2019-06-30 DIAGNOSIS — Z01812 Encounter for preprocedural laboratory examination: Secondary | ICD-10-CM | POA: Diagnosis present

## 2019-06-30 DIAGNOSIS — I2584 Coronary atherosclerosis due to calcified coronary lesion: Secondary | ICD-10-CM | POA: Insufficient documentation

## 2019-06-30 DIAGNOSIS — Z1159 Encounter for screening for other viral diseases: Secondary | ICD-10-CM | POA: Diagnosis not present

## 2019-06-30 DIAGNOSIS — I2510 Atherosclerotic heart disease of native coronary artery without angina pectoris: Secondary | ICD-10-CM

## 2019-06-30 LAB — CBC WITH DIFFERENTIAL/PLATELET
Abs Immature Granulocytes: 0.01 10*3/uL (ref 0.00–0.07)
Basophils Absolute: 0 10*3/uL (ref 0.0–0.1)
Basophils Relative: 0 %
Eosinophils Absolute: 0.1 10*3/uL (ref 0.0–0.5)
Eosinophils Relative: 2 %
HCT: 42.3 % (ref 39.0–52.0)
Hemoglobin: 14.5 g/dL (ref 13.0–17.0)
Immature Granulocytes: 0 %
Lymphocytes Relative: 35 %
Lymphs Abs: 1.8 10*3/uL (ref 0.7–4.0)
MCH: 30 pg (ref 26.0–34.0)
MCHC: 34.3 g/dL (ref 30.0–36.0)
MCV: 87.6 fL (ref 80.0–100.0)
Monocytes Absolute: 0.4 10*3/uL (ref 0.1–1.0)
Monocytes Relative: 7 %
Neutro Abs: 3 10*3/uL (ref 1.7–7.7)
Neutrophils Relative %: 56 %
Platelets: 159 10*3/uL (ref 150–400)
RBC: 4.83 MIL/uL (ref 4.22–5.81)
RDW: 13.3 % (ref 11.5–15.5)
WBC: 5.3 10*3/uL (ref 4.0–10.5)
nRBC: 0 % (ref 0.0–0.2)

## 2019-06-30 LAB — BASIC METABOLIC PANEL
Anion gap: 9 (ref 5–15)
BUN: 22 mg/dL (ref 8–23)
CO2: 23 mmol/L (ref 22–32)
Calcium: 10 mg/dL (ref 8.9–10.3)
Chloride: 107 mmol/L (ref 98–111)
Creatinine, Ser: 1.03 mg/dL (ref 0.61–1.24)
GFR calc Af Amer: >60 mL/min (ref >60)
GFR calc non Af Amer: >60 mL/min (ref >60)
Glucose, Bld: 124 mg/dL — ABNORMAL HIGH (ref 70–99)
Potassium: 3.9 mmol/L (ref 3.5–5.1)
Sodium: 139 mmol/L (ref 135–145)

## 2019-07-01 LAB — SARS CORONAVIRUS 2 (TAT 6-24 HRS): SARS Coronavirus 2: NEGATIVE

## 2019-07-04 ENCOUNTER — Telehealth: Payer: Self-pay | Admitting: Internal Medicine

## 2019-07-04 ENCOUNTER — Telehealth: Payer: Self-pay | Admitting: *Deleted

## 2019-07-04 NOTE — Telephone Encounter (Signed)
Pt contacted pre-coronary atherectomy scheduled at [facility] for: Wednesday July 05, 2019 10:30 AM Verified arrival time and place: [place] [facility]tr[facility] A at: 8:30 AM  Covid-19 test date: 06/30/19  No solid food after midnight prior to cath, clear liquids until 5 AM day of procedure. Contrast allergy: no   AM meds can be  taken pre-cath with sip of water including: ASA 81 mg Plavix 75 mg   Confirmed patient has responsible person to drive home post procedure and observe 24 hours after arriving home: yes  Due to Covid-19 pandemic, only one support person will be allowed with patient. Must be the same support person for that patient's entire stay, will be screened and required to wear a mask.  Patients are required to wear a mask when they enter the hospital.      COVID-19 Pre-Screening Questions:  . In the past 7 to 10 days have you had a cough,  shortness of breath, headache, congestion, fever (100 or greater) body aches, chills, sore throat, or sudden loss of taste or sense of smell? no . Have you been around anyone with known Covid 19? no . Have you been around anyone who is awaiting Covid 19 test results in the past 7 to 10 days? no . Have you been around anyone who has been exposed to Covid 19, or has mentioned symptoms of Covid 19 within the past 7 to 10 days? no   I reviewed procedure/mask/visitor, Covid-19 screening questions with patient, he verbalized understanding, thanked me for call.

## 2019-07-04 NOTE — Telephone Encounter (Signed)
I spoke with the patient and his wife regarding tomorrow's planned atherectomy to the proximal LAD with Dr. Fletcher Anon. I have reviewed the risks, indications, and alternatives to cardiac catheterization, atherectomy, and stenting with the patient. Risks include but are not limited to bleeding, infection, vascular injury, stroke, myocardial infection, arrhythmia, kidney injury, radiation-related injury in the case of prolonged fluoroscopy use, emergency cardiac surgery, and death. The patient understands the risks of serious complication is 1-2 in 3810 with diagnostic cardiac cath and 1-2% or less with intervention.  [name] [name] is agreeable to proceeding.  He has been taking clopidogrel and is tolerating this well.  Nelva Bush, MD [facility] Pager: [phone]

## 2019-07-05 ENCOUNTER — Other Ambulatory Visit: Payer: Self-pay

## 2019-07-05 ENCOUNTER — Ambulatory Visit (HOSPITAL_COMMUNITY)
Admission: RE | Admit: 2019-07-05 | Discharge: 2019-07-06 | Disposition: A | Discharge status: Home / Self Care | Payer: Medicare Other | Attending: Cardiovascular Disease | Admitting: Cardiovascular Disease

## 2019-07-05 ENCOUNTER — Encounter (HOSPITAL_COMMUNITY)
Admission: RE | Disposition: A | Discharge status: Home / Self Care | Payer: Medicare Other | Source: Home / Self Care | Attending: Cardiovascular Disease

## 2019-07-05 ENCOUNTER — Encounter (HOSPITAL_COMMUNITY): Payer: Self-pay | Admitting: Cardiovascular Disease

## 2019-07-05 DIAGNOSIS — E7800 Pure hypercholesterolemia, unspecified: Secondary | ICD-10-CM | POA: Insufficient documentation

## 2019-07-05 DIAGNOSIS — I480 Paroxysmal atrial fibrillation: Secondary | ICD-10-CM | POA: Diagnosis not present

## 2019-07-05 DIAGNOSIS — I471 Supraventricular tachycardia, unspecified: Secondary | ICD-10-CM

## 2019-07-05 DIAGNOSIS — Z955 Presence of coronary angioplasty implant and graft: Secondary | ICD-10-CM

## 2019-07-05 DIAGNOSIS — Z7902 Long term (current) use of antithrombotics/antiplatelets: Secondary | ICD-10-CM | POA: Insufficient documentation

## 2019-07-05 DIAGNOSIS — I472 Ventricular tachycardia: Secondary | ICD-10-CM

## 2019-07-05 DIAGNOSIS — Z823 Family history of stroke: Secondary | ICD-10-CM | POA: Insufficient documentation

## 2019-07-05 DIAGNOSIS — I4729 Other ventricular tachycardia: Secondary | ICD-10-CM

## 2019-07-05 DIAGNOSIS — E039 Hypothyroidism, unspecified: Secondary | ICD-10-CM | POA: Insufficient documentation

## 2019-07-05 DIAGNOSIS — I2584 Coronary atherosclerosis due to calcified coronary lesion: Secondary | ICD-10-CM | POA: Diagnosis not present

## 2019-07-05 DIAGNOSIS — Z885 Allergy status to narcotic agent status: Secondary | ICD-10-CM | POA: Insufficient documentation

## 2019-07-05 DIAGNOSIS — I25118 Atherosclerotic heart disease of native coronary artery with other forms of angina pectoris: Secondary | ICD-10-CM

## 2019-07-05 DIAGNOSIS — E785 Hyperlipidemia, unspecified: Secondary | ICD-10-CM

## 2019-07-05 DIAGNOSIS — I200 Unstable angina: Secondary | ICD-10-CM

## 2019-07-05 DIAGNOSIS — I208 Other forms of angina pectoris: Secondary | ICD-10-CM | POA: Diagnosis present

## 2019-07-05 DIAGNOSIS — I2089 Other forms of angina pectoris: Secondary | ICD-10-CM | POA: Diagnosis present

## 2019-07-05 HISTORY — PX: CORONARY ATHERECTOMY: CATH118238

## 2019-07-05 HISTORY — DX: Atherosclerotic heart disease of native coronary artery without angina pectoris: I25.10

## 2019-07-05 HISTORY — PX: CORONARY STENT INTERVENTION: CATH118234

## 2019-07-05 HISTORY — PX: CORONARY BALLOON ANGIOPLASTY: CATH118233

## 2019-07-05 LAB — POCT ACTIVATED CLOTTING TIME
Activated Clotting Time: 494 seconds
Activated Clotting Time: 582 seconds

## 2019-07-05 SURGERY — CORONARY ATHERECTOMY
Anesthesia: LOCAL

## 2019-07-05 MED ORDER — SODIUM CHLORIDE 0.9% FLUSH
3.0000 mL | Freq: Two times a day (BID) | INTRAVENOUS | Status: DC
  Administered 2019-07-05: 3 mL via INTRAVENOUS

## 2019-07-05 MED ORDER — SODIUM CHLORIDE 0.9 % WEIGHT BASED INFUSION
1.0000 mL/kg/h | INTRAVENOUS | Status: AC
  Administered 2019-07-05: 1 mL/kg/h via INTRAVENOUS

## 2019-07-05 MED ORDER — PANTOPRAZOLE SODIUM 40 MG PO TBEC
40.0000 mg | DELAYED_RELEASE_TABLET | Freq: Every day | ORAL | Status: DC
  Administered 2019-07-05: 40 mg via ORAL
  Filled 2019-07-05: qty 1

## 2019-07-05 MED ORDER — ROSUVASTATIN CALCIUM 20 MG PO TABS
40.0000 mg | ORAL_TABLET | Freq: Every evening | ORAL | Status: DC
  Administered 2019-07-05: 40 mg via ORAL
  Filled 2019-07-05: qty 2

## 2019-07-05 MED ORDER — ASPIRIN 81 MG PO CHEW: 81.0000 mg | CHEWABLE_TABLET | ORAL | Status: DC

## 2019-07-05 MED ORDER — EZETIMIBE 10 MG PO TABS
10.0000 mg | ORAL_TABLET | Freq: Every evening | ORAL | Status: DC
  Administered 2019-07-05: 10 mg via ORAL
  Filled 2019-07-05: qty 1

## 2019-07-05 MED ORDER — FENTANYL CITRATE (PF) 100 MCG/2ML IJ SOLN
INTRAMUSCULAR | Status: DC | PRN
  Administered 2019-07-05: 25 ug via INTRAVENOUS

## 2019-07-05 MED ORDER — SODIUM CHLORIDE 0.9 % IV SOLN: 250.0000 mL | INTRAVENOUS | Status: DC | PRN

## 2019-07-05 MED ORDER — SODIUM CHLORIDE 0.9 % WEIGHT BASED INFUSION
3.0000 mL/kg/h | INTRAVENOUS | Status: DC
  Administered 2019-07-05: 3 mL/kg/h via INTRAVENOUS

## 2019-07-05 MED ORDER — SODIUM CHLORIDE 0.9% FLUSH: 3.0000 mL | Freq: Two times a day (BID) | INTRAVENOUS | Status: DC

## 2019-07-05 MED ORDER — VERAPAMIL HCL 2.5 MG/ML IV SOLN
INTRAVENOUS | Status: DC | PRN
  Administered 2019-07-05: 10:00:00 10 mL via INTRA_ARTERIAL

## 2019-07-05 MED ORDER — LEVOTHYROXINE SODIUM 25 MCG PO TABS
25.0000 ug | ORAL_TABLET | Freq: Every day | ORAL | Status: DC
  Administered 2019-07-06: 25 ug via ORAL
  Filled 2019-07-05: qty 1

## 2019-07-05 MED ORDER — LORATADINE 10 MG PO TABS
10.0000 mg | ORAL_TABLET | Freq: Every day | ORAL | Status: DC
  Administered 2019-07-05: 10 mg via ORAL
  Filled 2019-07-05: qty 1

## 2019-07-05 MED ORDER — HEPARIN SODIUM (PORCINE) 1000 UNIT/ML IJ SOLN
INTRAMUSCULAR | Status: DC | PRN
  Administered 2019-07-05: 10000 [IU] via INTRAVENOUS

## 2019-07-05 MED ORDER — CLOPIDOGREL BISULFATE 75 MG PO TABS
75.0000 mg | ORAL_TABLET | Freq: Every day | ORAL | Status: DC
  Administered 2019-07-06: 75 mg via ORAL
  Filled 2019-07-05: qty 1

## 2019-07-05 MED ORDER — ONDANSETRON HCL 4 MG/2ML IJ SOLN: 4.0000 mg | Freq: Four times a day (QID) | INTRAMUSCULAR | Status: DC | PRN

## 2019-07-05 MED ORDER — DILTIAZEM HCL ER COATED BEADS 180 MG PO CP24
180.0000 mg | ORAL_CAPSULE | Freq: Every day | ORAL | Status: DC
  Administered 2019-07-06: 180 mg via ORAL
  Filled 2019-07-05: qty 1

## 2019-07-05 MED ORDER — HEPARIN (PORCINE) IN NACL 1000-0.9 UT/500ML-% IV SOLN
INTRAVENOUS | Status: DC | PRN
  Administered 2019-07-05 (×2): 500 mL

## 2019-07-05 MED ORDER — ANGIOPLASTY BOOK
Freq: Once | Status: AC
  Administered 2019-07-06: 1
  Filled 2019-07-05: qty 1

## 2019-07-05 MED ORDER — ACETAMINOPHEN 325 MG PO TABS: 650.0000 mg | ORAL_TABLET | ORAL | Status: DC | PRN

## 2019-07-05 MED ORDER — SODIUM CHLORIDE 0.9% FLUSH: 3.0000 mL | INTRAVENOUS | Status: DC | PRN

## 2019-07-05 MED ORDER — MIDAZOLAM HCL 2 MG/2ML IJ SOLN
INTRAMUSCULAR | Status: DC | PRN
  Administered 2019-07-05: 1 mg via INTRAVENOUS

## 2019-07-05 MED ORDER — NITROGLYCERIN 1 MG/10 ML FOR IR/CATH LAB
INTRA_ARTERIAL | Status: DC | PRN
  Administered 2019-07-05: 200 ug via INTRACORONARY

## 2019-07-05 MED ORDER — NITROGLYCERIN 0.4 MG SL SUBL: 0.4000 mg | SUBLINGUAL_TABLET | SUBLINGUAL | Status: DC | PRN

## 2019-07-05 MED ORDER — ASPIRIN EC 81 MG PO TBEC
81.0000 mg | DELAYED_RELEASE_TABLET | Freq: Every day | ORAL | Status: DC
  Administered 2019-07-05 – 2019-07-06 (×2): 81 mg via ORAL
  Filled 2019-07-05 (×2): qty 1

## 2019-07-05 MED ORDER — IOHEXOL 350 MG/ML SOLN
INTRAVENOUS | Status: DC | PRN
  Administered 2019-07-05: 170 mL via INTRAVENOUS

## 2019-07-05 MED ORDER — NITROGLYCERIN 1 MG/10 ML FOR IR/CATH LAB
INTRA_ARTERIAL | Status: AC
  Filled 2019-07-05: qty 10

## 2019-07-05 MED ORDER — LIDOCAINE HCL (PF) 1 % IJ SOLN
INTRAMUSCULAR | Status: DC | PRN
  Administered 2019-07-05: 2 mL

## 2019-07-05 MED ORDER — CLOPIDOGREL BISULFATE 75 MG PO TABS: 75.0000 mg | ORAL_TABLET | ORAL | Status: DC

## 2019-07-05 MED ORDER — SODIUM CHLORIDE 0.9 % WEIGHT BASED INFUSION: 1.0000 mL/kg/h | INTRAVENOUS | Status: DC

## 2019-07-05 MED ORDER — ADULT MULTIVITAMIN W/MINERALS CH: 1.0000 | ORAL_TABLET | Freq: Every day | ORAL | Status: DC

## 2019-07-05 SURGICAL SUPPLY — 22 items
BALLN SAPPHIRE 2.5X20 (BALLOONS) ×2
BALLN ~~LOC~~ EMERGE MR 3.25X30 (BALLOONS) ×2
BALLOON SAPPHIRE 2.5X20 (BALLOONS) ×1 IMPLANT
BALLOON ~~LOC~~ EMERGE MR 3.25X30 (BALLOONS) ×1 IMPLANT
CATH VISTA GUIDE 7FRXB LAD 3.5 (CATHETERS) ×2 IMPLANT
COVER DOME SNAP 22 D (MISCELLANEOUS) ×2 IMPLANT
CROWN DIAMONDBACK CLASSIC 1.25 (BURR) ×2 IMPLANT
DEVICE RAD COMP TR BAND LRG (VASCULAR PRODUCTS) ×2 IMPLANT
GLIDESHEATH SLENDER 7FR .021G (SHEATH) ×2 IMPLANT
GUIDEWIRE INQWIRE 1.5J.035X260 (WIRE) ×1 IMPLANT
INQWIRE 1.5J .035X260CM (WIRE) ×2
KIT ENCORE 26 ADVANTAGE (KITS) ×2 IMPLANT
KIT HEART LEFT (KITS) ×2 IMPLANT
LUBRICANT VIPERSLIDE CORONARY (MISCELLANEOUS) ×2 IMPLANT
PACK CARDIAC CATHETERIZATION (CUSTOM PROCEDURE TRAY) ×2 IMPLANT
STENT RESOLUTE ONYX 3.0X8 (Permanent Stent) ×2 IMPLANT
STENT RESOLUTE ONYX3.0X38 (Permanent Stent) ×2 IMPLANT
TRANSDUCER W/STOPCOCK (MISCELLANEOUS) ×2 IMPLANT
TUBING CIL FLEX 10 FLL-RA (TUBING) ×2 IMPLANT
WIRE ASAHI PROWATER 180CM (WIRE) ×2 IMPLANT
WIRE RUNTHROUGH .014X180CM (WIRE) ×2 IMPLANT
WIRE VIPERWIRE COR FLEX .012 (WIRE) ×2 IMPLANT

## 2019-07-05 NOTE — Interval H&P Note (Signed)
History and Physical Interval Note: Diagnostic cardiac catheterization was done by Dr. Saunders Revel which showed significant ostial/proximal and mid LAD disease which is heavily calcified.  I discussed the case with Dr. Saunders Revel and we both agreed that the best treatment option is atherectomy and stenting.  The procedure and its risks were discussed in details with the patient and family by Dr. Saunders Revel.   Cath Lab Visit (complete for each Cath Lab visit)  Clinical Evaluation Leading to the Procedure:   ACS: No.  Non-ACS:    Anginal Classification: CCS III  Anti-ischemic medical therapy: Minimal Therapy (1 class of medications)  Non-Invasive Test Results: High-risk stress test findings: cardiac mortality >3%/year  Prior CABG: No previous CABG        07/05/2019 10:13 AM  [name] [name]  has presented today for surgery, with the diagnosis of CAD.  The various methods of treatment have been discussed with the patient and family. After consideration of risks, benefits and other options for treatment, the patient has consented to  Procedure(s): CORONARY ATHERECTOMY (N/A) as a surgical intervention.  The patient's history has been reviewed, patient examined, no change in status, stable for surgery.  I have reviewed the patient's chart and labs.  Questions were answered to the patient's satisfaction.     Kathlyn [place]

## 2019-07-06 ENCOUNTER — Encounter (HOSPITAL_COMMUNITY): Payer: Self-pay | Admitting: General Practice

## 2019-07-06 ENCOUNTER — Other Ambulatory Visit: Payer: Self-pay

## 2019-07-06 DIAGNOSIS — I480 Paroxysmal atrial fibrillation: Secondary | ICD-10-CM

## 2019-07-06 DIAGNOSIS — E785 Hyperlipidemia, unspecified: Secondary | ICD-10-CM | POA: Diagnosis not present

## 2019-07-06 DIAGNOSIS — E039 Hypothyroidism, unspecified: Secondary | ICD-10-CM | POA: Diagnosis not present

## 2019-07-06 DIAGNOSIS — I471 Supraventricular tachycardia, unspecified: Secondary | ICD-10-CM

## 2019-07-06 DIAGNOSIS — I472 Ventricular tachycardia: Secondary | ICD-10-CM

## 2019-07-06 DIAGNOSIS — Z955 Presence of coronary angioplasty implant and graft: Secondary | ICD-10-CM | POA: Diagnosis not present

## 2019-07-06 DIAGNOSIS — E7800 Pure hypercholesterolemia, unspecified: Secondary | ICD-10-CM | POA: Diagnosis not present

## 2019-07-06 DIAGNOSIS — I2584 Coronary atherosclerosis due to calcified coronary lesion: Secondary | ICD-10-CM | POA: Diagnosis not present

## 2019-07-06 DIAGNOSIS — I4729 Other ventricular tachycardia: Secondary | ICD-10-CM

## 2019-07-06 DIAGNOSIS — I25118 Atherosclerotic heart disease of native coronary artery with other forms of angina pectoris: Secondary | ICD-10-CM | POA: Diagnosis not present

## 2019-07-06 LAB — BASIC METABOLIC PANEL
Anion gap: 8 (ref 5–15)
BUN: 18 mg/dL (ref 8–23)
CO2: 22 mmol/L (ref 22–32)
Calcium: 9.5 mg/dL (ref 8.9–10.3)
Chloride: 109 mmol/L (ref 98–111)
Creatinine, Ser: 0.91 mg/dL (ref 0.61–1.24)
GFR calc Af Amer: >60 mL/min (ref >60)
GFR calc non Af Amer: >60 mL/min (ref >60)
Glucose, Bld: 102 mg/dL — ABNORMAL HIGH (ref 70–99)
Potassium: 3.9 mmol/L (ref 3.5–5.1)
Sodium: 139 mmol/L (ref 135–145)

## 2019-07-06 LAB — CBC
HCT: 40.2 % (ref 39.0–52.0)
Hemoglobin: 13.5 g/dL (ref 13.0–17.0)
MCH: 30.2 pg (ref 26.0–34.0)
MCHC: 33.6 g/dL (ref 30.0–36.0)
MCV: 89.9 fL (ref 80.0–100.0)
Platelets: 142 10*3/uL — ABNORMAL LOW (ref 150–400)
RBC: 4.47 MIL/uL (ref 4.22–5.81)
RDW: 13.8 % (ref 11.5–15.5)
WBC: 5.4 10*3/uL (ref 4.0–10.5)
nRBC: 0 % (ref 0.0–0.2)

## 2019-07-06 MED ORDER — PANTOPRAZOLE SODIUM 40 MG PO TBEC: 40.0000 mg | DELAYED_RELEASE_TABLET | Freq: Every day | ORAL | 6 refills | Status: DC

## 2019-07-06 NOTE — Discharge Instructions (Signed)
Some studies suggest Prilosec/Omeprazole interacts with Plavix. We changed your Prilosec/Omeprazole to Protonix for less chance of interaction.     Radial Site Care  This sheet gives you information about how to care for yourself after your procedure. Your health care provider may also give you more specific instructions. If you have problems or questions, contact your health care provider. What can I expect after the procedure? After the procedure, it is common to have:  Bruising and tenderness at the catheter insertion area. Follow these instructions at home: Medicines  Take over-the-counter and prescription medicines only as told by your health care provider. Insertion site care  Follow instructions from your health care provider about how to take care of your insertion site. Make sure you: ? Wash your hands with soap and water before you change your bandage (dressing). If soap and water are not available, use hand sanitizer. ? Change your dressing as told by your health care provider. ? Leave stitches (sutures), skin glue, or adhesive strips in place. These skin closures may need to stay in place for 2 weeks or longer. If adhesive strip edges start to loosen and curl up, you may trim the loose edges. Do not remove adhesive strips completely unless your health care provider tells you to do that.  Check your insertion site every day for signs of infection. Check for: ? Redness, swelling, or pain. ? Fluid or blood. ? Pus or a bad smell. ? Warmth.  Do not take baths, swim, or use a hot tub until your health care provider approves.  You may shower 24-48 hours after the procedure, or as directed by your health care provider. ? Remove the dressing and gently wash the site with plain soap and water. ? Pat the area dry with a clean towel. ? Do not rub the site. That could cause bleeding.  Do not apply powder or lotion to the site. Activity   For 24 hours after the procedure, or as  directed by your health care provider: ? Do not flex or bend the affected arm. ? Do not push or pull heavy objects with the affected arm. ? Do not drive yourself home from the hospital or clinic. You may drive 24 hours after the procedure unless your health care provider tells you not to. ? Do not operate machinery or power tools.  Do not lift anything that is heavier than 10 lb (4.5 kg), or the limit that you are told, until your health care provider says that it is safe.  Ask your health care provider when it is okay to: ? Return to work or school. ? Resume usual physical activities or sports. ? Resume sexual activity. General instructions  If the catheter site starts to bleed, raise your arm and put firm pressure on the site. If the bleeding does not stop, get help right away. This is a medical emergency.  If you went home on the same day as your procedure, a responsible adult should be with you for the first 24 hours after you arrive home.  Keep all follow-up visits as told by your health care provider. This is important. Contact a health care provider if:  You have a fever.  You have redness, swelling, or yellow drainage around your insertion site. Get help right away if:  You have unusual pain at the radial site.  The catheter insertion area swells very fast.  The insertion area is bleeding, and the bleeding does not stop when you hold steady  pressure on the area.  Your arm or hand becomes pale, cool, tingly, or numb. These symptoms may represent a serious problem that is an emergency. Do not wait to see if the symptoms will go away. Get medical help right away. Call your local emergency services (911 in the [place] Do not drive yourself to the hospital. Summary  After the procedure, it is common to have bruising and tenderness at the site.  Follow instructions from your health care provider about how to take care of your radial site wound. Check the wound every day for  signs of infection.  Do not lift anything that is heavier than 10 lb (4.5 kg), or the limit that you are told, until your health care provider says that it is safe. This information is not intended to replace advice given to you by your health care provider. Make sure you discuss any questions you have with your health care provider. Document Released: 12/26/2010 Document Revised: 12/29/2017 Document Reviewed: 12/29/2017 Elsevier Patient Education  2020 [place] Heart-healthy meal planning includes:  Eating less unhealthy fats.  Eating more healthy fats.  Making other changes in your diet. Talk with your doctor or a diet specialist (dietitian) to create an eating plan that is right for you. What is my plan? Your doctor may recommend an eating plan that includes:  Total fat: ______% or less of total calories a day.  Saturated fat: ______% or less of total calories a day.  Cholesterol: less than _________mg a day. What are tips for following this plan? Cooking Avoid frying your food. Try to bake, boil, grill, or broil it instead. You can also reduce fat by:  Removing the skin from poultry.  Removing all visible fats from meats.  Steaming vegetables in water or broth. Meal planning   At meals, divide your plate into four equal parts: ? Fill one-half of your plate with vegetables and green salads. ? Fill one-fourth of your plate with whole grains. ? Fill one-fourth of your plate with lean protein foods.  Eat 4-5 servings of vegetables per day. A serving of vegetables is: ? 1 cup of raw or cooked vegetables. ? 2 cups of raw leafy greens.  Eat 4-5 servings of fruit per day. A serving of fruit is: ? 1 medium whole fruit. ?  cup of dried fruit. ?  cup of fresh, frozen, or canned fruit. ?  cup of 100% fruit juice.  Eat more foods that have soluble fiber. These are apples, broccoli, carrots, beans, peas, and barley. Try to get 20-30 g of  fiber per day.  Eat 4-5 servings of nuts, legumes, and seeds per week: ? 1 serving of dried beans or legumes equals  cup after being cooked. ? 1 serving of nuts is  cup. ? 1 serving of seeds equals 1 tablespoon. General information  Eat more home-cooked food. Eat less restaurant, buffet, and fast food.  Limit or avoid alcohol.  Limit foods that are high in starch and sugar.  Avoid fried foods.  Lose weight if you are overweight.  Keep track of how much salt (sodium) you eat. This is important if you have high blood pressure. Ask your doctor to tell you more about this.  Try to add vegetarian meals each week. Fats  Choose healthy fats. These include olive oil and canola oil, flaxseeds, walnuts, almonds, and seeds.  Eat more omega-3 fats. These include salmon, mackerel, sardines, tuna, flaxseed oil, and ground flaxseeds. Try  to eat fish at least 2 times each week.  Check food labels. Avoid foods with trans fats or high amounts of saturated fat.  Limit saturated fats. ? These are often found in animal products, such as meats, butter, and cream. ? These are also found in plant foods, such as palm oil, palm kernel oil, and coconut oil.  Avoid foods with partially hydrogenated oils in them. These have trans fats. Examples are stick margarine, some tub margarines, cookies, crackers, and other baked goods. What foods can I eat? Fruits All fresh, canned (in natural juice), or frozen fruits. Vegetables Fresh or frozen vegetables (raw, steamed, roasted, or grilled). Green salads. Grains Most grains. Choose whole wheat and whole grains most of the time. Rice and pasta, including brown rice and pastas made with whole wheat. Meats and other proteins Lean, well-trimmed beef, veal, pork, and lamb. Chicken and [place] without skin. All fish and shellfish. Wild duck, rabbit, pheasant, and venison. Egg whites or low-cholesterol egg substitutes. Dried beans, peas, lentils, and tofu. Seeds and  most nuts. Dairy Low-fat or nonfat cheeses, including ricotta and mozzarella. Skim or 1% milk that is liquid, powdered, or evaporated. Buttermilk that is made with low-fat milk. Nonfat or low-fat yogurt. Fats and oils Non-hydrogenated (trans-free) margarines. Vegetable oils, including soybean, sesame, sunflower, olive, peanut, safflower, corn, canola, and cottonseed. Salad dressings or mayonnaise made with a vegetable oil. Beverages Mineral water. Coffee and tea. Diet carbonated beverages. Sweets and desserts Sherbet, gelatin, and fruit ice. Small amounts of dark chocolate. Limit all sweets and desserts. Seasonings and condiments All seasonings and condiments. The items listed above may not be a complete list of foods and drinks you can eat. Contact a dietitian for more options. What foods should I avoid? Fruits Canned fruit in heavy syrup. Fruit in cream or butter sauce. Fried fruit. Limit coconut. Vegetables Vegetables cooked in cheese, cream, or butter sauce. Fried vegetables. Grains Breads that are made with saturated or trans fats, oils, or whole milk. Croissants. Sweet rolls. Donuts. High-fat crackers, such as cheese crackers. Meats and other proteins Fatty meats, such as hot dogs, ribs, sausage, bacon, rib-eye roast or steak. High-fat deli meats, such as salami and bologna. Caviar. Domestic duck and goose. Organ meats, such as liver. Dairy Cream, sour cream, cream cheese, and creamed cottage cheese. Whole-milk cheeses. Whole or 2% milk that is liquid, evaporated, or condensed. Whole buttermilk. Cream sauce or high-fat cheese sauce. Yogurt that is made from whole milk. Fats and oils Meat fat, or shortening. Cocoa butter, hydrogenated oils, palm oil, coconut oil, palm kernel oil. Solid fats and shortenings, including bacon fat, salt pork, lard, and butter. Nondairy cream substitutes. Salad dressings with cheese or sour cream. Beverages Regular sodas and juice drinks with added  sugar. Sweets and desserts Frosting. Pudding. Cookies. Cakes. Pies. Milk chocolate or white chocolate. Buttered syrups. Full-fat ice cream or ice cream drinks. The items listed above may not be a complete list of foods and drinks to avoid. Contact a dietitian for more information. Summary  Heart-healthy meal planning includes eating less unhealthy fats, eating more healthy fats, and making other changes in your diet.  Eat a balanced diet. This includes fruits and vegetables, low-fat or nonfat dairy, lean protein, nuts and legumes, whole grains, and heart-healthy oils and fats. This information is not intended to replace advice given to you by your health care provider. Make sure you discuss any questions you have with your health care provider. Document Released: 05/24/2012 Document Revised: 01/27/2018  Document Reviewed: 12/31/2017 Elsevier Patient Education  [employer].

## 2019-07-06 NOTE — Discharge Summary (Signed)
Discharge Summary    Patient ID: [name] [name] MRN: [id number]; DOB: Aug 09, 1951  Admit date: 07/05/2019 Discharge date: 07/06/2019  Primary Care Provider: Patient, No Pcp Per  Primary Cardiologist: Kathlyn Sacramento, MD   Discharge Diagnoses    Principal Problem:   Accelerating angina [facility]) Active Problems:   Effort angina ([place])   Status post coronary artery stent placement   Hyperlipidemia LDL goal <70   SVT (supraventricular tachycardia) (HCC)   NSVT (nonsustained ventricular tachycardia) (HCC)   PAF (paroxysmal atrial fibrillation) (HCC)   Allergies Allergies  Allergen Reactions   Hydrocodone-Acetaminophen Nausea And Vomiting and Other (See Comments)   Vicodin [Hydrocodone-Acetaminophen] Nausea And Vomiting    Vomiting    Diagnostic Studies/Procedures    CORONARY BALLOON ANGIOPLASTY  CORONARY STENT INTERVENTION  CORONARY ATHERECTOMY  Conclusion    Mid LAD lesion is 55% stenosed.  Prox Cx to Mid Cx lesion is 40% stenosed.  Mid RCA lesion is 30% stenosed.  Prox LAD to Mid LAD lesion is 85% stenosed.  2nd Diag lesion is 50% stenosed.  Post intervention, there is a 0% residual stenosis.  A drug-eluting stent was successfully placed using a STENT RESOLUTE ZHGD9.2E26.  Post intervention, there is a 0% residual stenosis.  A drug-eluting stent was successfully placed using a STENT RESOLUTE ONYX 3.0X8.  Post intervention, there is a 30% residual stenosis.  Balloon angioplasty was performed using a BALLOON SAPPHIRE 2.5X20.  Successful complex orbital atherectomy and 2 overlapped drug-eluting stent placement to the mid and proximal LAD extending into the ostium with balloon angioplasty of the ostial second diagonal. There was non-flow-limiting dissection in ostial diagonal with normal flow. This did not require treatment.  Recommendations: Dual antiplatelet therapy for at least 1 year. Aggressive treatment of risk factors.    Diagnostic Dominance:  Right  Intervention     History of Present Illness     Kennis Ruppertis a 68 y.o.malewith PAF diagnosed in 2004not on [place] in the setting of low CHADS2VASc,mild aortic valve regurgitation, hypothyroid,severe possibly familial hyperlipidemia, elevated liver function, and SVT and recent finding of CAD presented for scheduled arterectomy.   Patient was previously managed by [facility]. He was initially diagnosed with A. fib in 2004 and was managed with metoprolol initially though this was discontinued secondary to nightmares. Since then, he has been managed with diltiazem.  Rreadmill stress test on 03/16/2018 showed no evidence of ischemia with good exercise capacity with an exercise duration of 7 minutes and 48 seconds. Peak blood pressure was 187/68. Motion artifact noted.  Recently dealing with palpitations. Monitor 06/08/2019 showed 27 episodes of short SVTs.  The longest lasted 13 beats. No afib. He also had chest pain leading to CT of coronaries which was abnormal with FFR study showed severe stenosis in the proximal LAD, mid RCA, mid LCX artery.  Cath 06/26/2019: Conclusions: 1. Severe single-vessel CAD with 80-90% proximal LAD stenosis with heavy calcification. 2. Mild to moderate, non-obstructive CAD involving mid LAD, mid LCx, and mid RCA. 3. Elevated LVEDP with normal left ventricular contraction consistent with diastolic dysfunction.  Recommendations: 1. Plan for staged PCI to proximal LAD at [facility] using atherectomy. Will start patient on dual antiplatelet therapy with aspirin and clopidogrel.  Long-term, transitioning Mr. Luviano to NOAC + clopidogrel will need to be considered with his history of paroxysmal atrial fibrillation and CHADSVASC score of at least 2 (age + CAD). 2. Aggressive secondary prevention.  Hospital Course     Consultants: None  1. CAD - S/p  successful complex orbital atherectomy and 2 overlapped drug-eluting stent  placement to the mid and proximal LAD extending into the ostium with balloon angioplasty of the ostial second diagonal. There was non-flow-limiting dissection in ostial diagonal with normal flow. This did not require treatment. Tolerated procedure well. Ambulated with cardiac rehab.   Continue DAPT with ASA and Plavix.   2. Hx of afib in 2004 - No afib on recent monitor. Now CHADSVASC score of 2 for age and CAD. Will defer long term anticoagulation to Dr. Fletcher Anon.   3. NSVT - Noted on telemetry overnight for 5 beats, he did felt it. Prior non tolerance to BB. Electrolytes stable.  Continue cardizem at current dose. If ongoing palpitation, consider addition of BB or uptitration of CCB.   4. HLD  - No results found for requested labs within last 8760 hours. - Continue Zetia and crestor - LDL goal less than 70  Nexium changed to Protonix to avoid interaction with Plavix.   The patient been seen by Dr. Claiborne Billings today and deemed ready for discharge home. All follow-up appointments have been scheduled. Discharge medications are listed below.   Discharge Vitals Blood pressure 117/68, pulse 67, temperature 97.7 F (36.5 C), temperature source Oral, resp. rate 18, height 6' (1.829 m), weight 93.1 kg, SpO2 96 %.  Filed Weights   07/06/19 0511  Weight: 93.1 kg    Labs & Radiologic Studies    CBC Recent Labs    07/06/19 0346  WBC 5.4  HGB 13.5  HCT 40.2  MCV 89.9  PLT 300*   Basic Metabolic Panel Recent Labs    07/06/19 0346  NA 139  K 3.9  CL 109  CO2 22  GLUCOSE 102*  BUN 18  CREATININE 0.91  CALCIUM 9.5   _____________  Ct Coronary Morph W/cta Cor W/score W/ca W/cm &/or Wo/cm  Addendum Date: 06/16/2019   ADDENDUM REPORT: 06/16/2019 12:52 EXAM: OVER-READ INTERPRETATION  CT CHEST The following report is an over-read performed by radiologist Dr. Norlene Duel [facility] on 06/16/2019. This over-read does not include interpretation of cardiac or coronary  anatomy or pathology. The coronary calcium score/coronary CTA interpretation by the cardiologist is attached. COMPARISON:  05/04/2019 FINDINGS: Cardiovascular: Normal heart size.  No pericardial effusion. Mediastinum: Calcified subcarinal and left hilar lymph nodes. No adenopathy or mass. Lungs/pleura: No pleural effusion. Mild bilateral posterior basal ground-glass attenuation is noted likely reflecting dependent changes. Lungs otherwise clear. Upper abdomen: No acute abnormality within the imaged portions of the upper abdomen. Musculoskeletal: No acute or suspicious osseous abnormality. IMPRESSION: 1. Unremarkable over read. Electronically Signed   By: Kerby Moors M.D.   On: 06/16/2019 12:52   Result Date: 06/16/2019 CLINICAL DATA:  68 year old male h/o HLP, coronary calcifications on chest CTA and chest pain. EXAM: Cardiac/Coronary  CT TECHNIQUE: The patient was scanned on a [employer]. FINDINGS: A 120 kV prospective scan was triggered in the descending thoracic aorta at 111 HU's. Axial non-contrast 3 mm slices were carried out through the heart. The data set was analyzed on a dedicated work station and scored using the [place]. Gantry rotation speed was 250 msecs and collimation was .6 mm. Metoprolol 50 mg PO and 0.8 mg of sl NTG was given. The 3D data set was reconstructed in 5% intervals of the 67-82 % of the R-R cycle. Diastolic phases were analyzed on a dedicated work station using MPR, MIP and VRT modes. The patient received 80 cc of contrast. Aorta: Normal size.  Mild diffuse atherosclerotic plaque and calcifications. No dissection. Aortic Valve:  Trileaflet.  No calcifications. Coronary Arteries:  Normal coronary origin.  Right dominance. RCA is a large dominant artery that gives rise to PDA and PLA. Proximal RCA has mild diffuse calcified plaque with stenosis 25-49%. Mid RCA has a moderate mixed plaque with stenosis 50-69%. Distal RCA, PDA, PLA have only minimal plaque. Left main  is a long, large lumen artery that gives rise to LAD and LCX arteries. Left main has minimal diffuse plaque. LAD is a large vessel that gives rise to one diagonal artery. Proximal LAD has a long, complex, severe, predominantly calcified plaque with stenosis 50-69%, but stenosis > 70% can't be excluded. Mid-distal LAD and D1 have only minimal plaque. LCX is a non-dominant artery that gives rise to one small OM1 branch. There is mild calcified plaque with stenosis 25-49%. Mid LCX artery prior to take off of OM1 has moderate non-calcified plaque with stenosis 50-69%. OM1 is a medium size artery with minimal plaque. Other findings: Normal pulmonary vein drainage into the left atrium. Normal let atrial appendage without a thrombus. Normal size of the pulmonary artery. IMPRESSION: 1. Coronary calcium score of 1333. This was 12 percentile for age and sex matched control. 2. Normal coronary origin with right dominance. 3. Proximal LAD has a long, complex, severe, predominantly calcified plaque with stenosis 50-69%, but stenosis > 70% can't be excluded. Aggressive medical management is recommended. Additional analysis with CT FFR will be submitted. Electronically Signed: By: Ena Dawley On: 06/16/2019 11:54   Ct Coronary Fractional Flow Reserve Data Prep  Result Date: 06/20/2019 EXAM: CT FFR ANALYSIS CLINICAL DATA:  68 year old male with chest pain. FINDINGS: FFRct analysis was performed on the original cardiac CT angiogram dataset. Diagrammatic representation of the FFRct analysis is provided in a separate PDF document in PACS. This dictation was created using the PDF document and an interactive 3D model of the results. 3D model is not available in the EMR/PACS. Normal FFR range is >0.80. 1. Left Main: 0.98. 2. LAD: Ostial: 0.98, proximal: 0.50. 3. LCX: Proximal: 0.95, mid: 078. 4. OM1: 0.77. 5. RCA: Proximal: 0.98, mid: 0.82, PDA: 0.79. IMPRESSION: 1. CT FFR analysis showed severe stenosis in the proximal LAD, mid  RCA, mid LCX artery. A cardiac catheterization is recommended. Electronically Signed   By: Ena Dawley   On: 06/20/2019 00:07   Disposition   Pt is being discharged home today in good condition.  Follow-up Plans & Appointments    Follow-up Information    Wellington Hampshire, MD Follow up.   Specialty: Cardiology Why: office will call with date and time of appointment  Contact information: [place] [address] [phone]          Discharge Instructions    Amb Referral to Cardiac Rehabilitation   Complete by: As directed    Diagnosis: Coronary Stents   After initial evaluation and assessments completed: Virtual Based Care may be provided alone or in conjunction with Phase 2 Cardiac Rehab based on patient barriers.: Yes   Diet - low sodium heart healthy   Complete by: As directed    Discharge instructions   Complete by: As directed    No driving for 72 hours. No lifting over 5 lbs for 1 week. No sexual activity for 1 week. You may return to work on 07/17/2019. Keep procedure site clean & dry. If you notice increased pain, swelling, bleeding or pus, call/return!  You may  shower, but no soaking baths/hot tubs/pools for 1 week.   Increase activity slowly   Complete by: As directed       Discharge Medications   Allergies as of 07/06/2019      Reactions   Hydrocodone-acetaminophen Nausea And Vomiting, Other (See Comments)   Vicodin [hydrocodone-acetaminophen] Nausea And Vomiting   Vomiting      Medication List    STOP taking these medications   esomeprazole 20 MG capsule Commonly known as: [place] Replaced by: pantoprazole 40 MG tablet   omeprazole 20 MG capsule Commonly known as: PRILOSEC     TAKE these medications   aspirin EC 81 MG tablet Take 81 mg by mouth at bedtime. Notes to patient: TOMORROW   cetirizine 10 MG tablet Commonly known as: ZYRTEC Take 10 mg by mouth at bedtime. Notes to patient: TAKE TONIGHT     clopidogrel 75 MG tablet Commonly known as: Plavix Take 1 tablet (75 mg total) by mouth daily. Notes to patient: TOMORROW   Dilt-XR 180 MG 24 hr capsule Generic drug: diltiazem Take 1 capsule by mouth once daily What changed: how much to take Notes to patient: TOMORROW   ezetimibe 10 MG tablet Commonly known as: ZETIA TAKE 1 TABLET BY MOUTH ONCE DAILY What changed: when to take this   levothyroxine 25 MCG tablet Commonly known as: SYNTHROID Take 1 tablet (25 mcg total) by mouth daily before breakfast.   multivitamin tablet Take 1 tablet by mouth daily.   nitroGLYCERIN 0.4 MG SL tablet Commonly known as: Nitrostat Place 1 tablet (0.4 mg total) under the tongue every 5 (five) minutes as needed for chest pain.   pantoprazole 40 MG tablet Commonly known as: PROTONIX Take 1 tablet (40 mg total) by mouth daily. Replaces: esomeprazole 20 MG capsule   rosuvastatin 40 MG tablet Commonly known as: CRESTOR Take 1 tablet (40 mg total) by mouth daily. What changed: when to take this        Acute coronary syndrome (MI, NSTEMI, STEMI, etc) this admission?: No.    Outstanding Labs/Studies   N/A  Duration of Discharge Encounter   Greater than 30 minutes including physician time.  Signed, Leanor Kail, PA 07/06/2019, 11:37 AM

## 2019-07-06 NOTE — Progress Notes (Addendum)
Progress Note  Patient Name: [name] [name] Date of Encounter: 07/06/2019  Primary Cardiologist: Kathlyn Sacramento, MD   Subjective   Feeling well. No chest pain, sob or palpitations.   Inpatient Medications    Scheduled Meds: . aspirin EC  81 mg Oral QHS  . clopidogrel  75 mg Oral Daily  . diltiazem  180 mg Oral Daily  . ezetimibe  10 mg Oral QPM  . levothyroxine  25 mcg Oral Q0600  . loratadine  10 mg Oral Daily  . multivitamin with minerals  1 tablet Oral Daily  . pantoprazole  40 mg Oral Daily  . rosuvastatin  40 mg Oral QPM  . sodium chloride flush  3 mL Intravenous Q12H   Continuous Infusions: . sodium chloride     PRN Meds: sodium chloride, acetaminophen, nitroGLYCERIN, ondansetron (ZOFRAN) IV, sodium chloride flush   Vital Signs    Vitals:   07/05/19 1956 07/05/19 2000 07/05/19 2121 07/06/19 0511  BP: (!) 109/94   117/68  Pulse:    67  Resp: 20   18  Temp:   97.7 F (36.5 C) 97.7 F (36.5 C)  TempSrc:   Oral Oral  SpO2: 96% 94%  96%  Weight:    93.1 kg  Height:        Intake/Output Summary (Last 24 hours) at 07/06/2019 0911 Last data filed at 07/06/2019 0100 Gross per 24 hour  Intake 461.06 ml  Output 300 ml  Net 161.06 ml   Last 3 Weights 07/06/2019 06/26/2019 06/07/2019  Weight (lbs) 205 lb 4.8 oz 200 lb 199 lb  Weight (kg) 93.123 kg 90.719 kg 90.266 kg      Telemetry    Sr, NSVT x 5 beats - Personally Reviewed  ECG    NSR - Personally Reviewed  Physical Exam   GEN: No acute distress.   Neck: No JVD Cardiac: RRR, no murmurs, rubs, or gallops. Right radial cath site stable Respiratory: Clear to auscultation bilaterally. GI: Soft, nontender, non-distended  MS: No edema; No deformity. Neuro:  Nonfocal  Psych: Normal affect   Labs     Chemistry Recent Labs  Lab 06/30/19 0943 07/06/19 0346  NA 139 139  K 3.9 3.9  CL 107 109  CO2 23 22  GLUCOSE 124* 102*  BUN 22 18  CREATININE 1.03 0.91  CALCIUM 10.0 9.5  GFRNONAA >60 >60   GFRAA >60 >60  ANIONGAP 9 8     Hematology Recent Labs  Lab 06/30/19 0943 07/06/19 0346  WBC 5.3 5.4  RBC 4.83 4.47  HGB 14.5 13.5  HCT 42.3 40.2  MCV 87.6 89.9  MCH 30.0 30.2  MCHC 34.3 33.6  RDW 13.3 13.8  PLT 159 142*     Radiology    No results found.  Cardiac Studies   CORONARY BALLOON ANGIOPLASTY  CORONARY STENT INTERVENTION  CORONARY ATHERECTOMY  Conclusion    Mid LAD lesion is 55% stenosed.  Prox Cx to Mid Cx lesion is 40% stenosed.  Mid RCA lesion is 30% stenosed.  Prox LAD to Mid LAD lesion is 85% stenosed.  2nd Diag lesion is 50% stenosed.  Post intervention, there is a 0% residual stenosis.  A drug-eluting stent was successfully placed using a STENT RESOLUTE QBVQ9.4H03.  Post intervention, there is a 0% residual stenosis.  A drug-eluting stent was successfully placed using a STENT RESOLUTE ONYX 3.0X8.  Post intervention, there is a 30% residual stenosis.  Balloon angioplasty was performed using a BALLOON SAPPHIRE 2.5X20.  Successful complex orbital atherectomy and 2 overlapped drug-eluting stent placement to the mid and proximal LAD extending into the ostium with balloon angioplasty of the ostial second diagonal.  There was non-flow-limiting dissection in ostial diagonal with normal flow.  This did not require treatment.  Recommendations: Dual antiplatelet therapy for at least 1 year.  Aggressive treatment of risk factors.    Diagnostic Dominance: Right  Intervention     Patient Profile     [name] [name] is a 68 y.o. male with PAF diagnosed in 2004 not on [place] in the setting of low CHADS2VASc, mild aortic valve regurgitation, hypothyroid on Synthroid,severe possibly familial hyperlipidemia, elevated liver function, and SVT and recent finding of CTA presented for scheduled cath.    Assessment & Plan    1. CAD - S/p  successful complex orbital atherectomy and 2 overlapped drug-eluting stent placement to the mid and proximal  LAD extending into the ostium with balloon angioplasty of the ostial second diagonal.  There was non-flow-limiting dissection in ostial diagonal with normal flow.  This did not require treatment. - Continue DAPT with ASA and Plavix.   2. Hx of afib in 2004 - no afib on recent monitor. Now CHADSVASC score of 2 for age and CAD.   3. NSVT - noted last night for 5 beats, he did felt it. Prior non tolerance to BB. Electrolytes stable.  - Continue cardizem  4. HLD  - No results found for requested labs within last 8760 hours. - Continue Zetia and crestor   For questions or updates, please contact [place] Please consult [email or web address] for contact info under        Signed, Leanor Kail, PA  07/06/2019, 9:11 AM     Patient seen and examined. Agree with assessment and plan. Feels well;  HR in th 70s, notes occasional palpitations.  If persists as outpatient consider adding low dose beta blocker to cardizem. Radial site stable. LDL goal < 70.  OK for dc today.   Troy Sine, MD, [facility] 07/06/2019 9:49 AM

## 2019-07-06 NOTE — Progress Notes (Signed)
CARDIAC REHAB PHASE I   PRE:  Rate/Rhythm: 90 SR  BP:  Supine:   Sitting: 143/72  Standing:    SaO2: 97%RA  MODE:  Ambulation: 470 ft   POST:  Rate/Rhythm: 90 SR  BP:  Supine:   Sitting: 138/62  Standing:    SaO2: 97%RA 0811-0900 Pt walked 470 ft on RA with steady gait. Tolerated well. No CP. Education completed with pt who voiced understanding. Stressed importance of plavix with stent.  Discussed NTG use, ex ed and heart healthy diet given. Discussed CRP 2 and referral to [facility] done.  Pt is interested in participating in Virtual Cardiac Rehab. Pt advised that Virtual Cardiac Rehab is provided at no cost to the patient.  Checklist:  1. Pt has smart device  ie smartphone and/or ipad for downloading an app  Yes 2. Reliable internet/wifi service    Yes 3. Understands how to use their smartphone and navigate within an app.  Yes   Reviewed with pt the scheduling process for virtual cardiac rehab.  Pt verbalized understanding.    Graylon Good, RN BSN  07/06/2019 8:55 AM

## 2019-07-12 ENCOUNTER — Ambulatory Visit (INDEPENDENT_AMBULATORY_CARE_PROVIDER_SITE_OTHER): Payer: Medicare Other | Admitting: Physician Assistant

## 2019-07-12 DIAGNOSIS — E785 Hyperlipidemia, unspecified: Secondary | ICD-10-CM | POA: Diagnosis not present

## 2019-07-12 DIAGNOSIS — Z1159 Encounter for screening for other viral diseases: Secondary | ICD-10-CM

## 2019-07-12 DIAGNOSIS — I200 Unstable angina: Secondary | ICD-10-CM

## 2019-07-12 DIAGNOSIS — Z955 Presence of coronary angioplasty implant and graft: Secondary | ICD-10-CM

## 2019-07-12 DIAGNOSIS — E039 Hypothyroidism, unspecified: Secondary | ICD-10-CM | POA: Diagnosis not present

## 2019-07-12 DIAGNOSIS — Z125 Encounter for screening for malignant neoplasm of prostate: Secondary | ICD-10-CM

## 2019-07-12 MED ORDER — LEVOTHYROXINE SODIUM 25 MCG PO TABS: 25.0000 ug | ORAL_TABLET | Freq: Every day | ORAL | 1 refills | Status: DC

## 2019-07-12 NOTE — Progress Notes (Signed)
Subjective:    Patient ID: [name] [name], male    DOB: [birth year], 68 y.o.   MRN: [id number]  [name] [name] is a 68 y.o. male presenting on 07/12/2019 for No chief complaint on file.  Virtual Visit via Video Note  I connected with [name] [name] on 07/12/19 at  9:00 AM EDT by a video enabled telemedicine application and verified that I am speaking with the correct person using two identifiers.   I discussed the limitations of evaluation and management by telemedicine and the availability of in person appointments. The patient expressed understanding and agreed to proceed.  Patient location: home Provider location: [place] office  Persons involved in the visit: patient, provider   HPI   Works as Location manager. Goes by [name].  Cardiac History: The information below is reproduced from [name] Faith PA-C's note from 05/04/2019  History of Present Illness: [name] [name] is a 68 y.o. 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  Patient was previously managed by [facility]. He was initially diagnosed with A. fib in 2004 and was managed with metoprolol initially though this was discontinued secondary to nightmares. Since then, he has been managed with diltiazem. Prior stress echo in 2013 was unremarkable. Holter monitor in 12/2014 showed the predominant rhythm was sinus with an average heart rate of 71 bpm, minimum heart rate 45 bpm, maximum heart rate 118 bpm rare isolated PVCs, frequent isolated PACs totaling 4587, 6 atrial couplets, 3 runs of SVT with the longest lasting 7 beats with a maximum rate of 141 bpm, no significant pauses. No evidence of A. Fib.He was last seen in the office in 02/2018  and reported tolerating high-dose Crestor. He also noted an episode of abdominal pain that was upper epigastric and right upper quadrant regions lasting for approximately 2 hours and radiated to his back with associated heartburn. He also noted occasional chest aching with exertion. He has not been managed with full dose oral anticoagulation given aCHADS2VASc of 1. He underwent treadmill stress test on 03/16/2018 that showed no evidence of ischemia with good exercise capacity with an exercise duration of 7 minutes and 48 seconds. Peak blood pressure was 187/68. Motion artifact noted.  Patient called on 5/26 noting palpitations on 5/24 continuing into 5/25. With this, he decreased his caffeine with some improvement in the palpitations. He reported testing negative for COVID-19.   He was evaluated via telehealth on 05/03/2019 for increase in palpitations as well as fatigue, and chest pain. At that time he reported brief episodes of palpitations that he has attributed to Afib that are typically short-lived and without associated symptoms over the years. However, more recently dating back to 04/24/2019 he developed URI symptoms. He was taking OTC Mucinex with this and pushing fluids. He was seen at an outside urgent care on 04/28/2019 and tested negative for influenza as well as COVID-19. Around that same time he began to notice increase in palpitations that were stronger and longer lasting when compared to his typical episodes. With these episodes there was noted increased fatigue as well as chest tightness. Episodes of palpitations would last for several hours at a time, though were not constant. He did decrease his caffeine intake on 04/30/2019 with some noted improvement in palpitations, though not resolution. In telehealth follow  up, he continued to feel fatigued and note intermittent brief episodes of palpitations (obtained by self checking a carotid pulse). He did not check his BP or HR during any of  the above episodes, though did state his BP was in the 811B systolic on the morning of 5/27. Given the above, in person evaluation was advised.   With in person evaluation today, he continued to feel brief episodes of palpitations and report associated increased fatigue and chest pain.  He underwent echocardiogram as part of this appointment, at which time he noticed tenderness with deeper pressing of the ultrasound probe and located in his central chest/sternal region.  He reported that this tenderness to deep palpation felt similar in character to his previous chest pain episodes, which occurred with his palpitation episodes.  He also reported an episode of chest pain that occurred that same morning (5/28) and while brushing his hair.  He stated this episode was different in character than his previous CP episodes and only occurred with the movement of brushing his hair. As above, he noted improvement without complete resolution of his palpitations since reducing caffeine intake.he continues to note significant fatigue and lower extremity weakness which is unchanged.  He has remained afebrile outside of his isolated reading at the beginning of his symptoms with a T-max of 99.7.  He denies any recent injuries or trauma to the skin.  Preliminary read from the echo tech was preserved LV systolic function with inability to exclude bicuspid aortic valve, mild to moderate aortic valve regurgitation with a turbulent eccentric jet and aortic root measuring approximately 3.2 cm.  We are awaiting formal interpretation of the echo.   Of note, the patient tells me he was told at the time of his prior echo in 2013 there may have been some mitral valve insufficiency though does not recall being previously told of aortic valve regurgitation.  We have reached out to his primary cardiologist office to obtain his prior echo report but have been unable to get in touch with anyone.  Patient had coronary atherectomy of LAD  stenosis on 07/05/2019 and two stents placed. He is currently on Dual anti-platelet therapy with plavix and aspirin. He is walking two times a day starting out at 10 minutes a day.  Hypothyroidism: Taking synthroid 25 mcg daily   HLD: crestor 40 mg and zetia 10 mg daily.   Lipid Panel     Component Value Date/Time   CHOL 145 05/11/2018 0845   TRIG 129 05/11/2018 0845   HDL 46 05/11/2018 0845   CHOLHDL 3.2 05/11/2018 0845   VLDL 26 05/11/2018 0845   LDLCALC 73 05/11/2018 0845   A-fib: diltiazem for rate control 180 mg diltiazem extended release  Pulse Readings from Last 3 Encounters:  07/06/19 67  06/26/19 60  06/16/19 (!) 58   Pneumonia: vaccination, shingles  Has had shingles, tdap   Colon cancer screening  Colonoscopy at 67 and 56   Prostate   Lab Results  Component Value Date   TSH 2.987 05/04/2019     Past Medical History:  Diagnosis Date  . Acquired hypothyroidism   . Coronary artery disease    a. cath 06/2019 s/p  orbital atherectomy and 2 overlapped DES to the mid and proximal LAD; angiopasty to 2nd dig  . Palpitations   . Paroxysmal atrial fibrillation (HCC)    Diagnosed in 2004  . Pure hypercholesterolemia    Past Surgical History:  Procedure Laterality Date  . CORONARY ATHERECTOMY N/A 07/05/2019  Procedure: CORONARY ATHERECTOMY;  Surgeon: Wellington Hampshire, MD;  Location: [place] [facility];  Service: Cardiovascular;  Laterality: N/A;  . CORONARY BALLOON ANGIOPLASTY N/A 07/05/2019   Procedure: CORONARY BALLOON ANGIOPLASTY;  Surgeon: Wellington Hampshire, MD;  Location: [place] [facility];  Service: Cardiovascular;  Laterality: N/A;  . CORONARY STENT INTERVENTION N/A 07/05/2019   Procedure: CORONARY STENT INTERVENTION;  Surgeon: Wellington Hampshire, MD;  Location: [place] [facility];  Service: Cardiovascular;  Laterality: N/A;  . LEFT HEART CATH AND CORONARY ANGIOGRAPHY Left 06/26/2019   Procedure: LEFT HEART CATH AND CORONARY ANGIOGRAPHY;  Surgeon: Nelva Bush, MD;  Location: [place] [facility];  Service: Cardiovascular;  Laterality: Left;  Marland Kitchen MELANOMA EXCISION    . NECK SURGERY     Social History   Socioeconomic History  . Marital status: Married    Spouse name: Morey Hummingbird   . Number of children: 2  . Years of education: Not on file  . Highest education level: Not on file  Occupational History  . Not on file  Social Needs  . Financial resource strain: Not on file  . Food insecurity    Worry: Never true    Inability: Never true  . Transportation needs    Medical: No    Non-medical: No  Tobacco Use  . Smoking status: Never Smoker  . Smokeless tobacco: Never Used  Substance and Sexual Activity  . Alcohol use: Yes    Comment: Occ.   . Drug use: No  . Sexual activity: Not on file  Lifestyle  . Physical activity    Days per week: Not on file    Minutes per session: Not on file  . Stress: Only a little  Relationships  . Social connections    Talks on phone: More than three times a week    Gets together: Not on file    Attends religious service: Not on file    Active member of club or organization: Not on file    Attends meetings of clubs or organizations: Not on file    Relationship status: Not on file  . Intimate partner violence    Fear of current or ex partner: No    Emotionally abused: No    Physically abused: No    Forced sexual activity: No  Other Topics Concern  . Not on file  Social History Narrative  . Not on file   Family History  Problem Relation Age of Onset  . Stroke Father    Current Outpatient Medications on File Prior to Visit  Medication Sig  . aspirin EC 81 MG tablet Take 81 mg by mouth at bedtime.  . cetirizine (ZYRTEC) 10 MG tablet Take 10 mg by mouth at bedtime.   . clopidogrel (PLAVIX) 75 MG tablet Take 1 tablet (75 mg total) by mouth daily.  Marland Kitchen DILT-XR 180 MG 24 hr capsule Take 1 capsule by mouth once daily (Patient taking differently: Take 180 mg by mouth daily. )  . ezetimibe (ZETIA)  10 MG tablet TAKE 1 TABLET BY MOUTH ONCE DAILY (Patient taking differently: Take 10 mg by mouth every evening. )  . Multiple Vitamin (MULTIVITAMIN) tablet Take 1 tablet by mouth daily.  . nitroGLYCERIN (NITROSTAT) 0.4 MG SL tablet Place 1 tablet (0.4 mg total) under the tongue every 5 (five) minutes as needed for chest pain.  . pantoprazole (PROTONIX) 40 MG tablet Take 1 tablet (40 mg total) by mouth daily.  . rosuvastatin (CRESTOR) 40 MG tablet  Take 1 tablet (40 mg total) by mouth daily. (Patient taking differently: Take 40 mg by mouth every evening. )   No current facility-administered medications on file prior to visit.     Review of Systems Per HPI unless specifically indicated above     Objective:    There were no vitals taken for this visit.  Wt Readings from Last 3 Encounters:  07/06/19 205 lb 4.8 oz (93.1 kg)  06/26/19 200 lb (90.7 kg)  06/07/19 199 lb (90.3 kg)    Physical Exam Constitutional:      Appearance: Normal appearance.  Neurological:     Mental Status: He is alert.  Psychiatric:        Mood and Affect: Mood normal.        Behavior: Behavior normal.    Results for orders placed or performed during the hospital encounter of 05/69/79  Basic metabolic panel  Result Value Ref Range   Sodium 139 135 - 145 mmol/L   Potassium 3.9 3.5 - 5.1 mmol/L   Chloride 109 98 - 111 mmol/L   CO2 22 22 - 32 mmol/L   Glucose, Bld 102 (H) 70 - 99 mg/dL   BUN 18 8 - 23 mg/dL   Creatinine, Ser 0.91 0.61 - 1.24 mg/dL   Calcium 9.5 8.9 - 10.3 mg/dL   GFR calc non Af Amer >60 >60 mL/min   GFR calc Af Amer >60 >60 mL/min   Anion gap 8 5 - 15  CBC  Result Value Ref Range   WBC 5.4 4.0 - 10.5 K/uL   RBC 4.47 4.22 - 5.81 MIL/uL   Hemoglobin 13.5 13.0 - 17.0 g/dL   HCT 40.2 39.0 - 52.0 %   MCV 89.9 80.0 - 100.0 fL   MCH 30.2 26.0 - 34.0 pg   MCHC 33.6 30.0 - 36.0 g/dL   RDW 13.8 11.5 - 15.5 %   Platelets 142 (L) 150 - 400 K/uL   nRBC 0.0 0.0 - 0.2 %  POCT Activated clotting  time  Result Value Ref Range   Activated Clotting Time 494 seconds  POCT Activated clotting time  Result Value Ref Range   Activated Clotting Time 582 seconds      Assessment & Plan:  1. Hyperlipidemia LDL goal <70  - Lipid Profile  2. Accelerating angina ([place])   3. Status post coronary artery stent placement   4. Hypothyroidism, unspecified type  - levothyroxine (SYNTHROID) 25 MCG tablet; Take 1 tablet (25 mcg total) by mouth daily before breakfast.  Dispense: 90 tablet; Refill: 1 - Comprehensive Metabolic Panel (CMET)  5. Prostate cancer screening  - PSA  6. Need for hepatitis C screening test  - Hepatitis c antibody (reflex)    Follow up plan: Return in about 6 months (around 01/12/2020) for hypothyroidism.  Carles Collet, PA-C [facility] 07/24/2019, 10:36 PM

## 2019-07-17 NOTE — Progress Notes (Deleted)
   Follow-up Outpatient Visit Date: 07/19/2019  Primary Care Provider: Patient, No Pcp Per No address on file   Primary Cardiologist: Kathlyn Sacramento, MD  Chief Complaint: ***  HPI:  [name] [name] is a 68 y.o. 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  --------------------------------------------------------------------------------------------------  ***  Recent CV Pertinent Labs: Lab Results  Component Value Date   CHOL 145 05/11/2018   HDL 46 05/11/2018   LDLCALC 73 05/11/2018   TRIG 129 05/11/2018   CHOLHDL 3.2 05/11/2018   K 3.9 07/06/2019   MG 2.2 05/04/2019   BUN 18 07/06/2019   CREATININE 0.91 07/06/2019    Past medical and surgical history were reviewed and updated in EPIC.  No outpatient medications have been marked as taking for the 07/19/19 encounter (Appointment) with Ketura Sirek, [name] Gave, MD.    Allergies: Hydrocodone-acetaminophen and Vicodin [hydrocodone-acetaminophen]  Social History   Tobacco Use  . Smoking status: Never Smoker  . Smokeless tobacco: Never Used  Substance Use Topics  . Alcohol use: Yes    Comment: Occ.   . Drug use: No    Family History  Problem Relation Age of Onset  . Stroke Father     Review of Systems: A 12-system review of systems was performed and was negative except as noted in the HPI.  --------------------------------------------------------------------------------------------------  Physical Exam: There were no vitals taken for this visit.  General:  *** HEENT: No conjunctival pallor or scleral icterus. Moist mucous membranes.  OP clear. Neck: Supple without lymphadenopathy, thyromegaly, JVD, or HJR. No carotid bruit. Lungs: Normal work of breathing. Clear to auscultation bilaterally  without wheezes or crackles. Heart: Regular rate and rhythm without murmurs, rubs, or gallops. Non-displaced PMI. Abd: Bowel sounds present. Soft, NT/ND without hepatosplenomegaly Ext: No lower extremity edema. Radial, PT, and DP pulses are 2+ bilaterally. Skin: Warm and dry without rash.  EKG:  ***  Lab Results  Component Value Date   WBC 5.4 07/06/2019   HGB 13.5 07/06/2019   HCT 40.2 07/06/2019   MCV 89.9 07/06/2019   PLT 142 (L) 07/06/2019    Lab Results  Component Value Date   NA 139 07/06/2019   K 3.9 07/06/2019   CL 109 07/06/2019   CO2 22 07/06/2019   BUN 18 07/06/2019   CREATININE 0.91 07/06/2019   GLUCOSE 102 (H) 07/06/2019   ALT 61 (H) 05/04/2019    Lab Results  Component Value Date   CHOL 145 05/11/2018   HDL 46 05/11/2018   LDLCALC 73 05/11/2018   TRIG 129 05/11/2018   CHOLHDL 3.2 05/11/2018    --------------------------------------------------------------------------------------------------  ASSESSMENT AND PLAN: [name] Gave Anel Purohit, MD 07/17/2019 3:44 PM

## 2019-07-18 ENCOUNTER — Telehealth: Payer: Self-pay | Admitting: Cardiovascular Disease

## 2019-07-18 NOTE — Telephone Encounter (Signed)
Patient was called regarding the need to reschedule appointment with Dr. Fletcher Anon or an APP. Patient understood the concept of why he should see one of those providers but also wants to be seen for his follow up from surgery in July. He returned to work on 8/10 and has still not discussed his limitations or instructions. Patient would prefer to still come in on 8/12 if able or at least be called with some instructions with an appointment at a later date. (FYI: Patient can also only be seen in office on a Wednesday or Thursday). Please advise.

## 2019-07-18 NOTE — Telephone Encounter (Signed)
I will forward this to Dr. Fletcher Anon, as he would be the best person to discuss return to work with the patient.  Nelva Bush, MD [facility] Pager: [phone]

## 2019-07-18 NOTE — Telephone Encounter (Signed)
-----  Message from Nelva Bush, MD sent at 07/17/2019  3:47 PM EDT ----- Regarding: Post-PCI follow-up Hello,  I noticed that Mr. [name] [name] is scheduled to see me for f/u of recent PCI with Dr. Fletcher Anon.  He is a long-standing patient of Dr. Tyrell Antonio (I met him during diagnostic cath that needed to be done while Dr. Fletcher Anon was on vacation).  Unless he wishes to transition his care to me or has an acute issues that needs to be addressed before Dr. Fletcher Anon has an opening, I think it would be more appropriate for him to be rescheduled with Dr. Fletcher Anon at his earliest convenience.  Please let me know if any questions or concerns arise.  Thanks.  Gerald Stabs

## 2019-07-19 ENCOUNTER — Ambulatory Visit: Payer: Medicare Other | Admitting: Internal Medicine

## 2019-07-19 NOTE — Telephone Encounter (Signed)
He can resume work on August 10 with no restrictions.

## 2019-07-24 NOTE — Patient Instructions (Signed)
Hypothyroidism  Hypothyroidism is when the thyroid gland does not make enough of certain hormones (it is underactive). The thyroid gland is a small gland located in the lower front part of the neck, just in front of the windpipe (trachea). This gland makes hormones that help control how the body uses food for energy (metabolism) as well as how the heart and brain function. These hormones also play a role in keeping your bones strong. When the thyroid is underactive, it produces too little of the hormones thyroxine (T4) and triiodothyronine (T3). What are the causes? This condition may be caused by:  Hashimoto's disease. This is a disease in which the body's disease-fighting system (immune system) attacks the thyroid gland. This is the most common cause.  Viral infections.  Pregnancy.  Certain medicines.  Birth defects.  Past radiation treatments to the head or neck for cancer.  Past treatment with radioactive iodine.  Past exposure to radiation in the environment.  Past surgical removal of part or all of the thyroid.  Problems with a gland in the center of the brain (pituitary gland).  Lack of enough iodine in the diet. What increases the risk? You are more likely to develop this condition if:  You are male.  You have a family history of thyroid conditions.  You use a medicine called lithium.  You take medicines that affect the immune system (immunosuppressants). What are the signs or symptoms? Symptoms of this condition include:  Feeling as though you have no energy (lethargy).  Not being able to tolerate cold.  Weight gain that is not explained by a change in diet or exercise habits.  Lack of appetite.  Dry skin.  Coarse hair.  Menstrual irregularity.  Slowing of thought processes.  Constipation.  Sadness or depression. How is this diagnosed? This condition may be diagnosed based on:  Your symptoms, your medical history, and a physical exam.  Blood  tests. You may also have imaging tests, such as an ultrasound or MRI. How is this treated? This condition is treated with medicine that replaces the thyroid hormones that your body does not make. After you begin treatment, it may take several weeks for symptoms to go away. Follow these instructions at home:  Take over-the-counter and prescription medicines only as told by your health care provider.  If you start taking any new medicines, tell your health care provider.  Keep all follow-up visits as told by your health care provider. This is important. ? As your condition improves, your dosage of thyroid hormone medicine may change. ? You will need to have blood tests regularly so that your health care provider can monitor your condition. Contact a health care provider if:  Your symptoms do not get better with treatment.  You are taking thyroid replacement medicine and you: ? Sweat a lot. ? Have tremors. ? Feel anxious. ? Lose weight rapidly. ? Cannot tolerate heat. ? Have emotional swings. ? Have diarrhea. ? Feel weak. Get help right away if you have:  Chest pain.  An irregular heartbeat.  A rapid heartbeat.  Difficulty breathing. Summary  Hypothyroidism is when the thyroid gland does not make enough of certain hormones (it is underactive).  When the thyroid is underactive, it produces too little of the hormones thyroxine (T4) and triiodothyronine (T3).  The most common cause is Hashimoto's disease, a disease in which the body's disease-fighting system (immune system) attacks the thyroid gland. The condition can also be caused by viral infections, medicine, pregnancy, or past   radiation treatment to the head or neck.  Symptoms may include weight gain, dry skin, constipation, feeling as though you do not have energy, and not being able to tolerate cold.  This condition is treated with medicine to replace the thyroid hormones that your body does not make. This information  is not intended to replace advice given to you by your health care provider. Make sure you discuss any questions you have with your health care provider. Document Released: 11/23/2005 Document Revised: 11/05/2017 Document Reviewed: 11/03/2017 Elsevier Patient Education  2020 [employer]  

## 2019-08-14 ENCOUNTER — Other Ambulatory Visit: Payer: Self-pay | Admitting: Physician Assistant

## 2019-08-14 NOTE — Progress Notes (Signed)
Cardiology Office Note    Date:  08/17/2019   ID:  [name] [name], DOB 1951/02/06, MRN [id number]  PCP:  Trinna Post, PA-C  Cardiologist:  Kathlyn Sacramento, MD  Electrophysiologist:  None   Chief Complaint: Follow up  History of Present Illness:   [name] [name] is a 68 y.o. male with history of CAD s/p recent PCI/DES to the LAD in 06/2019 as detailed below, PAF diagnosed in 2004 not on [place] in the setting of CHADS2VASc of 1,paroxysmal SVT, mild aortic valve regurgitation, diastolic dysfunction, hypothyroidism on Synthroid,severe possibly familial hyperlipidemia, elevated liver function, and strong family history of premature coronary artery disease on his father's side who presents forfollow upof staged PCI.  Patient was previously managed by Dr.Macomberin Jeani Hawking, [place]. He was initially diagnosed with A. fib in 2004 and was managed with metoprolol though this was discontinued secondary to nightmares. Since then, he has been managed with diltiazem. Prior stress echo in 2013 was unremarkable. Holter monitor in 12/2014 showed the predominant rhythm was sinus with an average heart rate of 71 bpm, minimum heart rate 45 bpm, maximum heart rate 118 bpm rare isolated PVCs, frequent isolated PACs totaling 4587, 6 atrial couplets, 3 runs of SVT with the longest lasting 7 beats with a maximum rate of 141 bpm, no significant pauses. No evidence of A. Fib.He was seen in the office in 02/2018 and reported tolerating high-dose Crestor. He noted an episode of abdominal pain that was upper epigastric and right upper quadrant regions lasting for approximately 2 hours and radiated to his back with associated heartburn along with occasional chest aching with exertion. He underwent treadmill stress test on 03/16/2018 that showed no evidence of ischemia with good exercise capacity with an exercise duration of 7 minutes and 48 seconds. Peak blood pressure was 187/68. Motion artifact noted.  He called on  05/02/2019 noting palpitations. With this, he decreased his caffeine with some improvement in the palpitations.He was evaluated via telehealth on 05/03/2019 for increase in palpitations as well as fatigue, and chest pain. At that time he reportedbrief episodes of palpitations that were typically short-lived and without associated symptomsover the years. However, more recently dating back to 04/24/2019 he developed URI symptoms. He was taking OTC Mucinex with this and pushing fluids. He was seen at an outside urgent care on 04/28/2019 and tested negative for influenza as well as COVID-19. Around that same time he began to notice increase in palpitations that were stronger and longer lasting when compared to his typical episodes. With these episodes there was noted increased fatigue as well as chest tightness. Episodes of palpitations would last for several hours at a time, though were not constant.In telehealth follow up, he continued to feel fatigued and note intermittent brief episodes of palpitations (obtained by self checking a carotid pulse).  He was seen in the office on 05/04/2019 for follow-up of the above and with preliminary echo report from the technologist indicating possible aortic valve insufficiency and he was referred to the [facility] CTA of the chest/abdomen/pelvis indicating no dissection, aortic aneurysm, or PE as well as atherosclerotic changes noted throughout the thoracoabdominal aorta, mild to moderate coronary artery calcifications, no acute intra-abdominal abnormality, severe sigmoid diverticulosis without CT evidence of diverticulitis, and an enlarged prostate gland with bilateral fat-containing inguinal hernias.  COVID-19 test was again negative.  Blood cultures were no growth x2.  TSH normal.  Magnesium 2.2.  Troponin negative.  Potassium 3.9, serum creatinine 0.84, AST 56, ALT 61.  CBC unremarkable.  Formal read of echo from 05/04/2019 showed an EF of 55-60%, mild concentric LVH, normal  diastolic function, normal RVSF, grossly normal mitral and tricuspid valves, mild AI.  Subsequent Zio from 05/2019 showed NSR with an average heart rate of 73 bpm with 27 episodes of short SVT with the longest episode lasting 13 beats.  He underwent coronary CTA in 06/2019 to assess his symptoms which showed left main FFR 0.98, ostial LAD FFR 0.98, proximal LAD FFR 0.50, proximal LCx FFR 0.95, mid LCx FFR 0.78, OM1 FFR 0.77, proximal RCA FFR 0.98, mid RCA FFR 0.82, PDA FFR 0.79. Given this, he underwent diagnostic LHC on 06/26/2019 that showed severe single-vessel CAD with 80-90% proximal LAD stenosis with heavy calcification as well as mild to moderate nonobstructive CAD involving the mid LAD, mid LCx, and mid RCA with elevated LVEDP and normal LV contraction consistent with diastolic dysfunction. Given the heavy calcification of the LAD, it was recommended he undergo staged PCI with atherectomy. He underwent successful complex orbital atherectomy and 2 overlapping DES to the proximal and mid LAD extending into the ostium with balloon angioplasty of the ostial D2. Procedure was complicated by a non-flow limiting dissection in the ostial diagonal with normal flow that did not require treatment.   He comes in doing well.  He denies any chest pain, shortness of breath, dizziness, presyncope, or syncope.  Since undergoing PCI he has noted an improvement in his functional status.  He continues to advance activities as tolerated without limitation.  He does note intermittent palpitations which have been longstanding and are brief, typically most noticeable at nighttime.  These are consistent with his prior episodes.  He denies any falls, BRBPR, or melena.  He is compliant with all medications including dual antiplatelet therapy.  No lower extremity swelling, abdominal surgeon, orthopnea, PND, or early satiety.  He does request a referral to urology for ED.   Labs: 06/2019 - HGB 13.5, PLT 142, potassium 3.9, SCr  0.91 04/2019 - TSH normal, magnesium 2.2, AST 56, ALT 61 05/2018 - TC 145, TG 129, HDL 46, LDL 73   Past Medical History:  Diagnosis Date   Acquired hypothyroidism    Coronary artery disease    a. cath 06/2019 s/p  orbital atherectomy and 2 overlapped DES to the mid and proximal LAD; angiopasty to 2nd dig   Paroxysmal atrial fibrillation ([place])    Diagnosed in 2004   Paroxysmal SVT (supraventricular tachycardia) ([place])    Pure hypercholesterolemia     Past Surgical History:  Procedure Laterality Date   CORONARY ATHERECTOMY N/A 07/05/2019   Procedure: CORONARY ATHERECTOMY;  Surgeon: Wellington Hampshire, MD;  Location: [facility];  Service: Cardiovascular;  Laterality: N/A;   CORONARY BALLOON ANGIOPLASTY N/A 07/05/2019   Procedure: CORONARY BALLOON ANGIOPLASTY;  Surgeon: Wellington Hampshire, MD;  Location: [facility];  Service: Cardiovascular;  Laterality: N/A;   CORONARY STENT INTERVENTION N/A 07/05/2019   Procedure: CORONARY STENT INTERVENTION;  Surgeon: Wellington Hampshire, MD;  Location: [facility];  Service: Cardiovascular;  Laterality: N/A;   LEFT HEART CATH AND CORONARY ANGIOGRAPHY Left 06/26/2019   Procedure: LEFT HEART CATH AND CORONARY ANGIOGRAPHY;  Surgeon: Nelva Bush, MD;  Location: [facility];  Service: Cardiovascular;  Laterality: Left;   MELANOMA EXCISION     NECK SURGERY      Current Medications: Current Meds  Medication Sig   aspirin EC 81 MG tablet Take 81 mg by mouth at bedtime.  cetirizine (ZYRTEC) 10 MG tablet Take 10 mg by mouth at bedtime.    clopidogrel (PLAVIX) 75 MG tablet Take 1 tablet (75 mg total) by mouth daily.   diltiazem (DILT-XR) 180 MG 24 hr capsule Take 1 capsule (180 mg total) by mouth daily.   ezetimibe (ZETIA) 10 MG tablet TAKE 1 TABLET BY MOUTH ONCE DAILY (Patient taking differently: Take 10 mg by mouth every evening. )   levothyroxine (SYNTHROID) 25 MCG tablet Take 1 tablet (25 mcg total) by mouth  daily before breakfast.   Multiple Vitamin (MULTIVITAMIN) tablet Take 1 tablet by mouth daily.   nitroGLYCERIN (NITROSTAT) 0.4 MG SL tablet Place 1 tablet (0.4 mg total) under the tongue every 5 (five) minutes as needed for chest pain.   pantoprazole (PROTONIX) 40 MG tablet Take 1 tablet (40 mg total) by mouth daily.   rosuvastatin (CRESTOR) 40 MG tablet Take 1 tablet (40 mg total) by mouth daily. (Patient taking differently: Take 40 mg by mouth every evening. )    Allergies:   Hydrocodone-acetaminophen and Vicodin [hydrocodone-acetaminophen]   Social History   Socioeconomic History   Marital status: Married    Spouse name: Morey Hummingbird    Number of children: 2   Years of education: Not on file   Highest education level: Not on file  Occupational History   Not on file  Social Needs   Financial resource strain: Not on file   Food insecurity    Worry: Never true    Inability: Never true   Transportation needs    Medical: No    Non-medical: No  Tobacco Use   Smoking status: Never Smoker   Smokeless tobacco: Never Used  Substance and Sexual Activity   Alcohol use: Yes    Comment: Occ.    Drug use: No   Sexual activity: Not on file  Lifestyle   Physical activity    Days per week: Not on file    Minutes per session: Not on file   Stress: Only a little  Relationships   Social connections    Talks on phone: More than three times a week    Gets together: Not on file    Attends religious service: Not on file    Active member of club or organization: Not on file    Attends meetings of clubs or organizations: Not on file    Relationship status: Not on file  Other Topics Concern   Not on file  Social History Narrative   Not on file     Family History:  The patient's family history includes Stroke in his father.  ROS:   Review of Systems  Constitutional: Negative for chills, diaphoresis, fever, malaise/fatigue and weight loss.  HENT: Negative for  congestion.   Eyes: Negative for discharge and redness.  Respiratory: Negative for cough, hemoptysis, sputum production, shortness of breath and wheezing.   Cardiovascular: Positive for palpitations. Negative for chest pain, orthopnea, claudication, leg swelling and PND.  Gastrointestinal: Negative for abdominal pain, blood in stool, heartburn, melena, nausea and vomiting.  Genitourinary: Negative for hematuria.       Positive for ED  Musculoskeletal: Negative for falls and myalgias.  Skin: Negative for rash.  Neurological: Negative for dizziness, tingling, tremors, sensory change, speech change, focal weakness, loss of consciousness and weakness.  Endo/Heme/Allergies: Does not bruise/bleed easily.  Psychiatric/Behavioral: Negative for substance abuse. The patient is not nervous/anxious.   All other systems reviewed and are negative.    EKGs/Labs/Other Studies Reviewed:  Studies reviewed were summarized above. The additional studies were reviewed today:  2D Echo 04/2018: 1. The left ventricle has normal systolic function, with an ejection fraction of 55-60%. The cavity size was normal. There is mild concentric left ventricular hypertrophy. Left ventricular diastolic parameters were normal.  2. The right ventricle has normal systolic function. The cavity was normal. There is no increase in right ventricular wall thickness.  3. The mitral valve is grossly normal.  4. The tricuspid valve is grossly normal.  5. The aortic valve has an indeterminate number of cusps. Aortic valve regurgitation is mild by color flow Doppler. __________  CTA chest/abd/pelvis: 1. No dissection.  No PE.  No aortic aneurysm. 2. Atherosclerotic changes are noted throughout the thoracic and abdominal aorta. 3. No acute intra-abdominal abnormality. 4. Severe sigmoid diverticulosis without CT evidence of diverticulitis. 5. Enlarged prostate gland. Bilateral fat containing inguinal hernias are noted.  Zio  05/2019: Normal sinus rhythm with an average heart rate of 73 bpm. 27 episodes of short SVTs.  The longest lasted 13 beats. __________  Coronary CTA/FFR: 1. Left Main: 0.98. 2. LAD: Ostial: 0.98, proximal: 0.50. 3. LCX: Proximal: 0.95, mid: 078. 4. OM1: 0.77. 5. RCA: Proximal: 0.98, mid: 0.82, PDA: 0.79.  IMPRESSION: 1. CT FFR analysis showed severe stenosis in the proximal LAD, mid RCA, mid LCX artery. A cardiac catheterization is recommended. __________  [facility] 06/2019: Conclusions: 1. Severe single-vessel CAD with 80-90% proximal LAD stenosis with heavy calcification. 2. Mild to moderate, non-obstructive CAD involving mid LAD, mid LCx, and mid RCA. 3. Elevated LVEDP with normal left ventricular contraction consistent with diastolic dysfunction.  Recommendations: 1. Plan for staged PCI to proximal LAD at [facility] using atherectomy. Will start patient on dual antiplatelet therapy with aspirin and clopidogrel.  Long-term, transitioning Mr. Bidinger to NOAC + clopidogrel will need to be considered with his history of paroxysmal atrial fibrillation and CHADSVASC score of at least 2 (age + CAD). 2. Aggressive secondary prevention. __________  PCI 06/2019:  Mid LAD lesion is 55% stenosed.  Prox Cx to Mid Cx lesion is 40% stenosed.  Mid RCA lesion is 30% stenosed.  Prox LAD to Mid LAD lesion is 85% stenosed.  2nd Diag lesion is 50% stenosed.  Post intervention, there is a 0% residual stenosis.  A drug-eluting stent was successfully placed using a STENT RESOLUTE [id number].  Post intervention, there is a 0% residual stenosis.  A drug-eluting stent was successfully placed using a STENT RESOLUTE ONYX 3.0X8.  Post intervention, there is a 30% residual stenosis.  Balloon angioplasty was performed using a BALLOON SAPPHIRE 2.5X20.   Successful complex orbital atherectomy and 2 overlapped drug-eluting stent placement to the mid and proximal LAD extending into the ostium with  balloon angioplasty of the ostial second diagonal.  There was non-flow-limiting dissection in ostial diagonal with normal flow.  This did not require treatment.  Recommendations: Dual antiplatelet therapy for at least 1 year.  Aggressive treatment of risk factors.   EKG:  EKG is ordered today.  The EKG ordered today demonstrates NSR, 65 bpm, baseline wandering along the limb leads, no acute ST-T changes  Recent Labs: 05/04/2019: ALT 61; Magnesium 2.2; TSH 2.987 07/06/2019: BUN 18; Creatinine, Ser 0.91; Hemoglobin 13.5; Platelets 142; Potassium 3.9; Sodium 139  Recent Lipid Panel    Component Value Date/Time   CHOL 145 05/11/2018 0845   TRIG 129 05/11/2018 0845   HDL 46 05/11/2018 0845   CHOLHDL 3.2 05/11/2018 0845   VLDL 26 05/11/2018  0845   LDLCALC 73 05/11/2018 0845    PHYSICAL EXAM:    VS:  BP 130/60 (BP Location: Left Arm, Patient Position: Sitting, Cuff Size: Normal)    Pulse 65    Ht 6' (1.829 m)    Wt 208 lb (94.3 kg)    BMI 28.21 kg/m   BMI: Body mass index is 28.21 kg/m.  Physical Exam  Constitutional: He is oriented to person, place, and time. He appears well-developed and well-nourished.  HENT:  Head: Normocephalic and atraumatic.  Eyes: Right eye exhibits no discharge. Left eye exhibits no discharge.  Neck: Normal range of motion. No JVD present.  Cardiovascular: Normal rate, regular rhythm, S1 normal, S2 normal and normal heart sounds. Exam reveals no distant heart sounds, no friction rub, no midsystolic click and no opening snap.  No murmur heard. Pulses:      Posterior tibial pulses are 2+ on the right side and 2+ on the left side.  Right radial cath site has healed well without any bleeding, bruising, swelling, warmth, erythema, or tenderness to palpation.  Radial pulse 2+.  Pulmonary/Chest: Effort normal and breath sounds normal. No respiratory distress. He has no decreased breath sounds. He has no wheezes. He has no rales. He exhibits no tenderness.   Abdominal: Soft. He exhibits no distension. There is no abdominal tenderness.  Musculoskeletal:        General: No edema.  Neurological: He is alert and oriented to person, place, and time.  Skin: Skin is warm and dry. No cyanosis. Nails show no clubbing.  Psychiatric: He has a normal mood and affect. His speech is normal and behavior is normal. Judgment and thought content normal.    Wt Readings from Last 3 Encounters:  08/17/19 208 lb (94.3 kg)  07/06/19 205 lb 4.8 oz (93.1 kg)  06/26/19 200 lb (90.7 kg)     ASSESSMENT & PLAN:   1. CAD involving the native coronary arteries without angina: He is doing well without any symptoms concerning for angina.  Status post successful complex orbital atherectomy and 2 overlapping drug-eluting stents to the proximal and mid LAD as well as PTCA of the ostial D2 on 07/05/2019.  Continue dual antiplatelet therapy without interruption for at least 12 months dating back to date of PCI.  Continue secondary prevention with aspirin, Plavix, Crestor, and Zetia.  No plans for further ischemic evaluation at this time.  Check CBC in the setting of dual antiplatelet therapy.  2. PAF: Denies any symptoms for sustained arrhythmia.  Appears to have been an isolated episode back in 2004 with multiple outpatient cardiac monitors since demonstrating no evidence of recurrence.  Given lack of recurrence and need for dual antiplatelet therapy as above he remains off full dose anticoagulation at this time.  If he does have objective evidence of recurrence of A. fib we will need to revisit anticoagulation.  3. Paroxysmal SVT: He does continue to note mild brief episodes of palpitations, particularly at nighttime when lying down.  Remains on long-acting diltiazem 180 mg daily.  Discussed potential escalation of diltiazem to 240 mg daily.  Given patient just recently refilled a 90-day supply of his current dose diltiazem he prefers to remain on 1 at 80 mg daily and see how his  palpitations trend over the next couple of months.  If he continues to note palpitations, or if they worsen, when he needs another refill he will let us know  4. Aortic valve regurgitation: Asymptomatic.  Noted on echo earlier  this year.  Follow-up with periodic echo.  5. Diastolic dysfunction: He appears euvolemic and well compensated.  Not requiring standing diuretic therapy.  Optimal blood pressure heart rate control recommended.  6. Hyperlipidemia: Most recent LDL of 73 from 05/2018 with mildly elevated liver function from 04/2019.  Check lipid panel, direct LDL, and CMP.  Goal LDL less than 70.  7. ED: Patient requests referral to urology which was placed.  Disposition: F/u with Dr. Fletcher Anon or an APP in 3 months.   Medication Adjustments/Labs and Tests Ordered: Current medicines are reviewed at length with the patient today.  Concerns regarding medicines are outlined above. Medication changes, Labs and Tests ordered today are summarized above and listed in the Patient Instructions accessible in Encounters.   Signed, Christell Faith, PA-C 08/17/2019 9:20 AM     [facility] [address] [place], [place] [address] [phone]

## 2019-08-15 ENCOUNTER — Encounter: Payer: Self-pay | Admitting: Physician Assistant

## 2019-08-17 ENCOUNTER — Encounter: Payer: Self-pay | Admitting: Physician Assistant

## 2019-08-17 ENCOUNTER — Other Ambulatory Visit: Payer: Self-pay

## 2019-08-17 ENCOUNTER — Ambulatory Visit (INDEPENDENT_AMBULATORY_CARE_PROVIDER_SITE_OTHER): Payer: Medicare Other | Admitting: Physician Assistant

## 2019-08-17 VITALS — BP 130/60 | HR 65 | Ht 72.0 in | Wt 208.0 lb

## 2019-08-17 DIAGNOSIS — Z79899 Other long term (current) drug therapy: Secondary | ICD-10-CM

## 2019-08-17 DIAGNOSIS — I2510 Atherosclerotic heart disease of native coronary artery without angina pectoris: Secondary | ICD-10-CM

## 2019-08-17 DIAGNOSIS — I471 Supraventricular tachycardia: Secondary | ICD-10-CM | POA: Diagnosis not present

## 2019-08-17 DIAGNOSIS — I480 Paroxysmal atrial fibrillation: Secondary | ICD-10-CM

## 2019-08-17 DIAGNOSIS — N529 Male erectile dysfunction, unspecified: Secondary | ICD-10-CM

## 2019-08-17 DIAGNOSIS — R002 Palpitations: Secondary | ICD-10-CM | POA: Diagnosis not present

## 2019-08-17 DIAGNOSIS — I5189 Other ill-defined heart diseases: Secondary | ICD-10-CM

## 2019-08-17 DIAGNOSIS — I351 Nonrheumatic aortic (valve) insufficiency: Secondary | ICD-10-CM

## 2019-08-17 DIAGNOSIS — E785 Hyperlipidemia, unspecified: Secondary | ICD-10-CM

## 2019-08-17 NOTE — Patient Instructions (Signed)
Medication Instructions:  Your physician recommends that you continue on your current medications as directed. Please refer to the Current Medication list given to you today.  If you need a refill on your cardiac medications before your next appointment, please call your pharmacy.   Lab work: Your physician recommends that you return for lab work in: TODAY (CBC, CMET, LIPID).  If you have labs (blood work) drawn today and your tests are completely normal, you will receive your results only by: Marland Kitchen MyChart Message (if you have MyChart) OR . A paper copy in the mail If you have any lab test that is abnormal or we need to change your treatment, we will call you to review the results.  Testing/Procedures: NONE  Follow-Up: You have been referred to [facility]. Someone will call you for an appointment.    At [facility], you and your health needs are our priority.  As part of our continuing mission to provide you with exceptional heart care, we have created designated Provider Care Teams.  These Care Teams include your primary Cardiologist (physician) and Advanced Practice Providers (APPs -  Physician Assistants and Nurse Practitioners) who all work together to provide you with the care you need, when you need it. You will need a follow up appointment in 3 months.  Please call our office 2 months in advance to schedule this appointment.  You may see Kathlyn Sacramento, MD or one of the following Advanced Practice Providers on your designated Care Team:   Murray Hodgkins, NP Christell Faith, PA-C . Marrianne Mood, PA-C

## 2019-08-18 ENCOUNTER — Telehealth: Payer: Self-pay | Admitting: *Deleted

## 2019-08-18 DIAGNOSIS — R7401 Elevation of levels of liver transaminase levels: Secondary | ICD-10-CM

## 2019-08-18 DIAGNOSIS — Z79899 Other long term (current) drug therapy: Secondary | ICD-10-CM

## 2019-08-18 LAB — LIPID PANEL
Chol/HDL Ratio: 3.1 ratio (ref 0.0–5.0)
Cholesterol, Total: 159 mg/dL (ref 100–199)
HDL: 51 mg/dL (ref >39)
LDL Chol Calc (NIH): 89 mg/dL (ref 0–99)
Triglycerides: 105 mg/dL (ref 0–149)
VLDL Cholesterol Cal: 19 mg/dL (ref 5–40)

## 2019-08-18 LAB — CBC WITH DIFFERENTIAL/PLATELET
Basophils Absolute: 0 10*3/uL (ref 0.0–0.2)
Basos: 1 %
EOS (ABSOLUTE): 0.1 10*3/uL (ref 0.0–0.4)
Eos: 1 %
Hematocrit: 42.8 % (ref 37.5–51.0)
Hemoglobin: 14.5 g/dL (ref 13.0–17.7)
Immature Grans (Abs): 0 10*3/uL (ref 0.0–0.1)
Immature Granulocytes: 0 %
Lymphocytes Absolute: 1.6 10*3/uL (ref 0.7–3.1)
Lymphs: 37 %
MCH: 29.6 pg (ref 26.6–33.0)
MCHC: 33.9 g/dL (ref 31.5–35.7)
MCV: 87 fL (ref 79–97)
Monocytes Absolute: 0.4 10*3/uL (ref 0.1–0.9)
Monocytes: 9 %
Neutrophils Absolute: 2.3 10*3/uL (ref 1.4–7.0)
Neutrophils: 52 %
Platelets: 166 10*3/uL (ref 150–450)
RBC: 4.9 x10E6/uL (ref 4.14–5.80)
RDW: 13.6 % (ref 11.6–15.4)
WBC: 4.3 10*3/uL (ref 3.4–10.8)

## 2019-08-18 LAB — COMPREHENSIVE METABOLIC PANEL
ALT: 56 IU/L — ABNORMAL HIGH (ref 0–44)
AST: 40 IU/L (ref 0–40)
Albumin/Globulin Ratio: 2.8 — ABNORMAL HIGH (ref 1.2–2.2)
Albumin: 4.8 g/dL (ref 3.8–4.8)
Alkaline Phosphatase: 77 IU/L (ref 39–117)
BUN/Creatinine Ratio: 20 (ref 10–24)
BUN: 18 mg/dL (ref 8–27)
Bilirubin Total: 0.5 mg/dL (ref 0.0–1.2)
CO2: 24 mmol/L (ref 20–29)
Calcium: 10.6 mg/dL — ABNORMAL HIGH (ref 8.6–10.2)
Chloride: 105 mmol/L (ref 96–106)
Creatinine, Ser: 0.91 mg/dL (ref 0.76–1.27)
GFR calc Af Amer: 99 mL/min/{1.73_m2} (ref >59)
GFR calc non Af Amer: 86 mL/min/{1.73_m2} (ref >59)
Globulin, Total: 1.7 g/dL (ref 1.5–4.5)
Glucose: 100 mg/dL — ABNORMAL HIGH (ref 65–99)
Potassium: 4.2 mmol/L (ref 3.5–5.2)
Sodium: 141 mmol/L (ref 134–144)
Total Protein: 6.5 g/dL (ref 6.0–8.5)

## 2019-08-18 NOTE — Telephone Encounter (Signed)
No answer. Left message to call back.   

## 2019-08-18 NOTE — Telephone Encounter (Signed)
-----   Message from Rise Mu, PA-C sent at 08/18/2019  7:19 AM EDT ----- Blood count normal. Renal function normal. Potassium at goal. Random calcium is mildly elevated with prior values being normal. 1 liver function test is mildly elevated though improved from prior. LDL slightly higher than prior and above goal.  Recommendations: -Regarding cholesterol, patient can either work on heart healthy diet with recheck a lipid panel in 6 months or we can start him on a PCSK9 inhibitor -Recommend rechecking CMP in 1 month to trend calcium and ALT

## 2019-08-18 NOTE — Telephone Encounter (Signed)
Returned call to patient with information regarding labs. Pt verbalized understanding and had no further questions at this time.   Advised pt to call for any further questions or concerns.

## 2019-08-18 NOTE — Telephone Encounter (Signed)
A nonfasting sample can elevate triglycerides which can affect the calculated LDL.  Continue heart healthy diet and we will check a fasting lipid panel in follow-up.

## 2019-08-18 NOTE — Telephone Encounter (Signed)
Results called to pt. Pt verbalized understanding of results and recommendations. Patient reports he was not fasting when the labs were drawn. He had eaten breakfast about 1.5 hours earlier.  Advised that I would make provider aware and let him know if the recommendations change. He was appreciative. He is aware to go to the [facility] in about 1 month for CMP to recheck calcium and ALT.

## 2019-09-22 ENCOUNTER — Encounter: Payer: Self-pay | Admitting: Urology

## 2019-09-22 ENCOUNTER — Other Ambulatory Visit: Payer: Self-pay

## 2019-09-22 ENCOUNTER — Ambulatory Visit: Payer: Medicare Other | Admitting: Urology

## 2019-09-22 VITALS — BP 130/73 | HR 78 | Ht 72.0 in | Wt 207.0 lb

## 2019-09-22 DIAGNOSIS — N5201 Erectile dysfunction due to arterial insufficiency: Secondary | ICD-10-CM

## 2019-09-22 MED ORDER — TADALAFIL 20 MG PO TABS: ORAL_TABLET | ORAL | 0 refills | Status: DC

## 2019-09-22 NOTE — Progress Notes (Signed)
09/22/2019 9:20 AM   [name] [name] 1951-04-14 [id number]  Referring provider: Trinna Post, PA-C [address] [place],  [place] [address]  Chief Complaint  Patient presents with  . Erectile Dysfunction    HPI: [name] [name] is a 68 y.o. male seen at the request of Carles Collet, PA-C for erectile dysfunction.  He has a long history of ED dating back to 2005.  He was initially taking sildenafil at a 25 mg dose which was effective.  He has had progressive worsening of his ED over the years.  He is status post coronary artery stent placement July 2020.  He most recently has been using generic sildenafil at an 80-100 mg dose which does improve his erections.  His erections are firm enough for penetration approximately 50% of the time.  SHIM was 15/25 indicating mild to moderate ED.  Denies pain or curvature with erections.  He has good libido.  Organic risk factors include coronary artery disease and medications.  No prior tobacco history.   PMH: Past Medical History:  Diagnosis Date  . Acquired hypothyroidism   . Coronary artery disease    a. cath 06/2019 s/p  orbital atherectomy and 2 overlapped DES to the mid and proximal LAD; angiopasty to 2nd dig  . ED (erectile dysfunction)   . Paroxysmal atrial fibrillation (HCC)    Diagnosed in 2004  . Paroxysmal SVT (supraventricular tachycardia) ([place])   . Pure hypercholesterolemia     Surgical History: Past Surgical History:  Procedure Laterality Date  . CORONARY ATHERECTOMY N/A 07/05/2019   Procedure: CORONARY ATHERECTOMY;  Surgeon: Wellington Hampshire, MD;  Location: [facility];  Service: Cardiovascular;  Laterality: N/A;  . CORONARY BALLOON ANGIOPLASTY N/A 07/05/2019   Procedure: CORONARY BALLOON ANGIOPLASTY;  Surgeon: Wellington Hampshire, MD;  Location: [facility];  Service: Cardiovascular;  Laterality: N/A;  . CORONARY STENT INTERVENTION N/A 07/05/2019   Procedure: CORONARY STENT INTERVENTION;  Surgeon:  Wellington Hampshire, MD;  Location: [facility];  Service: Cardiovascular;  Laterality: N/A;  . LEFT HEART CATH AND CORONARY ANGIOGRAPHY Left 06/26/2019   Procedure: LEFT HEART CATH AND CORONARY ANGIOGRAPHY;  Surgeon: Nelva Bush, MD;  Location: [facility];  Service: Cardiovascular;  Laterality: Left;  Marland Kitchen MELANOMA EXCISION    . NECK SURGERY      Home Medications:  Allergies as of 09/22/2019      Reactions   Hydrocodone-acetaminophen Nausea And Vomiting, Other (See Comments)   Vicodin [hydrocodone-acetaminophen] Nausea And Vomiting   Vomiting      Medication List       Accurate as of September 22, 2019  9:20 AM. If you have any questions, ask your nurse or doctor.        aspirin EC 81 MG tablet Take 81 mg by mouth at bedtime.   cetirizine 10 MG tablet Commonly known as: ZYRTEC Take 10 mg by mouth at bedtime.   clopidogrel 75 MG tablet Commonly known as: Plavix Take 1 tablet (75 mg total) by mouth daily.   diltiazem 180 MG 24 hr capsule Commonly known as: Dilt-XR Take 1 capsule (180 mg total) by mouth daily.   ezetimibe 10 MG tablet Commonly known as: ZETIA TAKE 1 TABLET BY MOUTH ONCE DAILY What changed: when to take this   levothyroxine 25 MCG tablet Commonly known as: SYNTHROID Take 1 tablet (25 mcg total) by mouth daily before breakfast.   multivitamin tablet Take 1 tablet by mouth daily.  nitroGLYCERIN 0.4 MG SL tablet Commonly known as: Nitrostat Place 1 tablet (0.4 mg total) under the tongue every 5 (five) minutes as needed for chest pain.   pantoprazole 40 MG tablet Commonly known as: PROTONIX Take 1 tablet (40 mg total) by mouth daily.   rosuvastatin 40 MG tablet Commonly known as: CRESTOR Take 1 tablet (40 mg total) by mouth daily. What changed: when to take this       Allergies:  Allergies  Allergen Reactions  . Hydrocodone-Acetaminophen Nausea And Vomiting and Other (See Comments)  . Vicodin [Hydrocodone-Acetaminophen]  Nausea And Vomiting    Vomiting    Family History: Family History  Problem Relation Age of Onset  . Stroke Father     Social History:  reports that he has never smoked. He has never used smokeless tobacco. He reports current alcohol use. He reports that he does not use drugs.  ROS: UROLOGY Frequent Urination?: No Hard to postpone urination?: No Burning/pain with urination?: No Get up at night to urinate?: No Leakage of urine?: No Urine stream starts and stops?: No Trouble starting stream?: No Do you have to strain to urinate?: No Blood in urine?: No Urinary tract infection?: No Sexually transmitted disease?: No Injury to kidneys or bladder?: No Painful intercourse?: No Weak stream?: No Erection problems?: Yes Penile pain?: No  Gastrointestinal Nausea?: No Vomiting?: No Indigestion/heartburn?: No Diarrhea?: No Constipation?: No  Constitutional Fever: No Night sweats?: No Weight loss?: No Fatigue?: No  Skin Skin rash/lesions?: No Itching?: No  Eyes Blurred vision?: No Double vision?: No  Ears/Nose/Throat Sore throat?: No Sinus problems?: No  Hematologic/Lymphatic Swollen glands?: No Easy bruising?: No  Cardiovascular Leg swelling?: No Chest pain?: No  Respiratory Cough?: No Shortness of breath?: No  Endocrine Excessive thirst?: No  Musculoskeletal Back pain?: No Joint pain?: No  Neurological Headaches?: No Dizziness?: No  Psychologic Depression?: No Anxiety?: No  Physical Exam: BP 130/73 (BP Location: Left Arm, Patient Position: Sitting, Cuff Size: Normal)   Pulse 78   Ht 6' (1.829 m)   Wt 207 lb (93.9 kg)   BMI 28.07 kg/m   Constitutional:  Alert and oriented, No acute distress. HEENT: [place] AT, moist mucus membranes.  Trachea midline, no masses. Cardiovascular: No clubbing, cyanosis, or edema. Respiratory: Normal respiratory effort, no increased work of breathing. GI: Abdomen is soft, nontender, nondistended, no abdominal  masses GU: Phallus circumcised without lesions.  No corporal plaques.  Testes descended bilaterally without masses or tenderness.  Spermatic cord/epididymis palpably normal bilaterally. Lymph: No cervical or inguinal lymphadenopathy. Skin: No rashes, bruises or suspicious lesions. Neurologic: Grossly intact, no focal deficits, moving all 4 extremities. Psychiatric: Normal mood and affect.   Assessment & Plan:    - Erectile dysfunction We discussed the etiology of his ED is most likely secondary to atherosclerotic disease.  Generic sildenafil has been partially effective.  He was interested in trying a different PDE 5 inhibitor and Rx generic tadalafil 20 mg was sent to pharmacy.  He has not taken nitroglycerin and was instructed not to take if he has taken tadalafil within within 36 hours.  Other options were discussed including intracavernosal injections, vacuum erection devices and penile prosthetic surgery.   [name] [name], [place] [address], [place], [place] [address] [phone]

## 2019-09-24 ENCOUNTER — Encounter: Payer: Self-pay | Admitting: Urology

## 2019-10-23 IMAGING — CT CT ANGIO CHEST-ABD-PELV FOR DISSECTION W/ AND WO/W CM
2 of 7 series · 14 of 46 positions shown, 16 images · IV contrast (APPLIED)
Comparison: None.

CLINICAL DATA: Shortness of breath and fatigue. Echo concerning for
aortic insufficiency. Concern for aortic dissection.

EXAM:
CT ANGIOGRAPHY CHEST, ABDOMEN AND PELVIS
TECHNIQUE: Multidetector CT imaging through the chest, abdomen and pelvis was
performed using the standard protocol during bolus administration of
intravenous contrast. Multiplanar reconstructed images and MIPs were
obtained and reviewed to evaluate the vascular anatomy.
CONTRAST:  100mL [id number]-319 IOPAMIDOL (8JATHT-319) INJECTION 76%

[Series 5: axial arterial · axial · arterial · 0.80mm/px · z∈[-877,-283]mm · 11 of 230 slices shown, 13 images]
[im 16/230  soft-tissue]
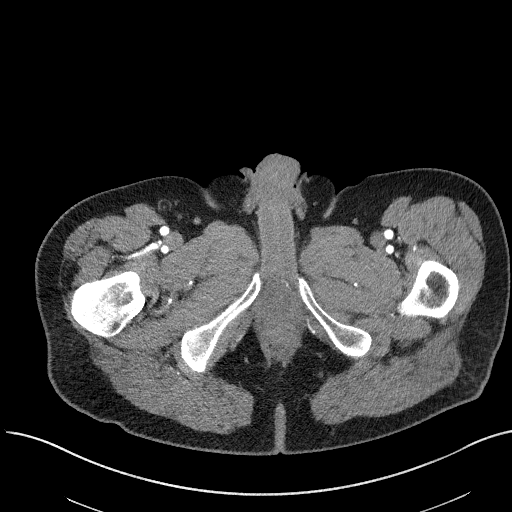
[im 16/230  bone]
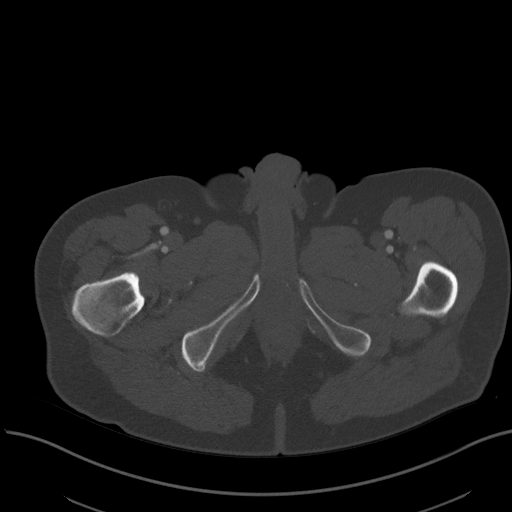
[im 31/230  soft-tissue]
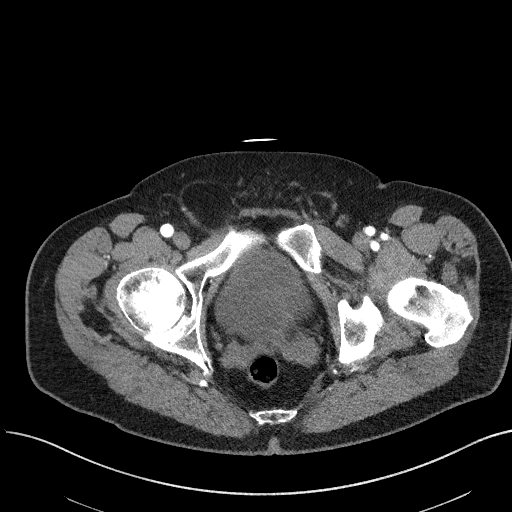
[im 62/230  soft-tissue]
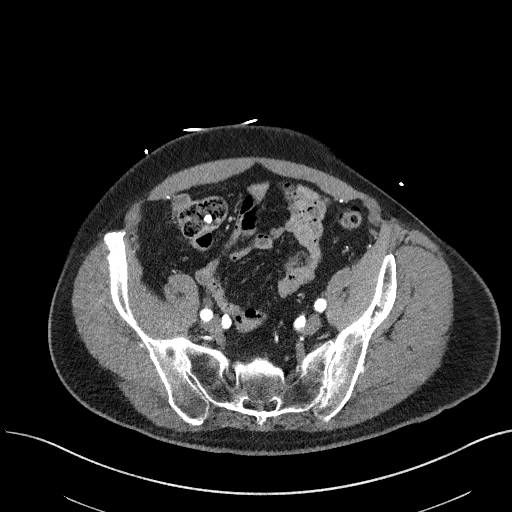
[im 77/230  soft-tissue]
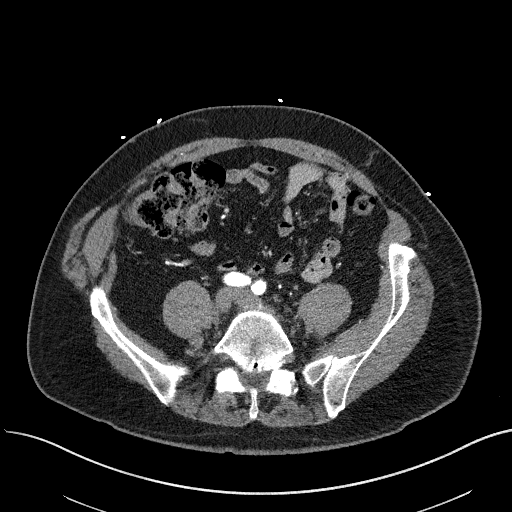
[im 92/230  soft-tissue]
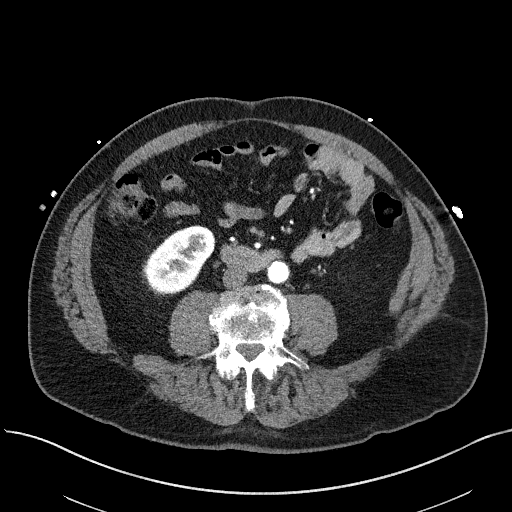
[im 123/230  soft-tissue]
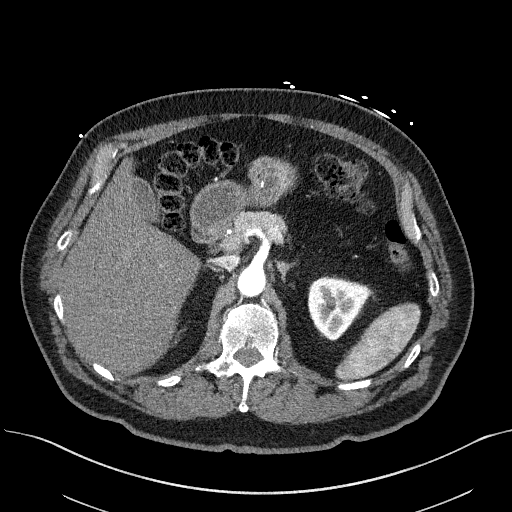
[im 138/230  soft-tissue]
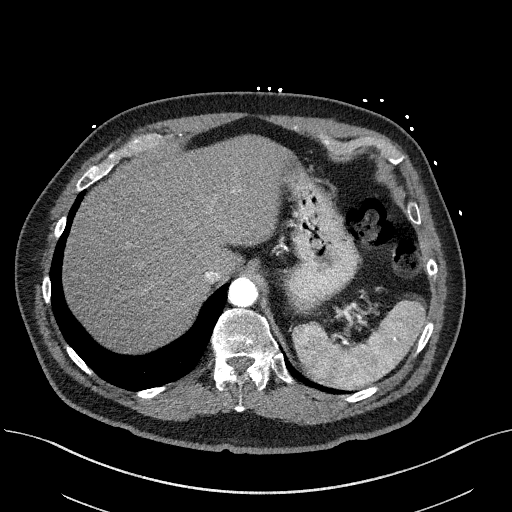
[im 153/230  soft-tissue]
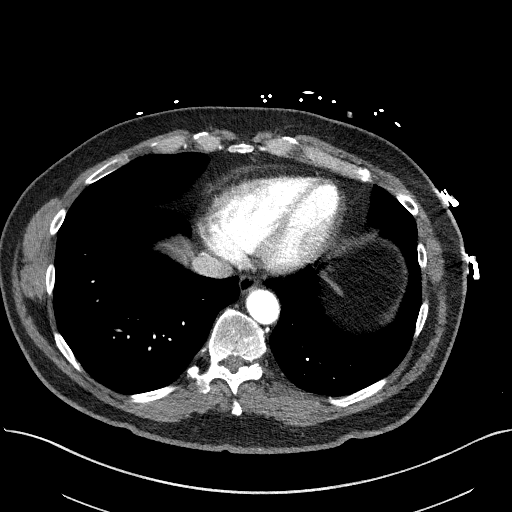
[im 168/230  soft-tissue]
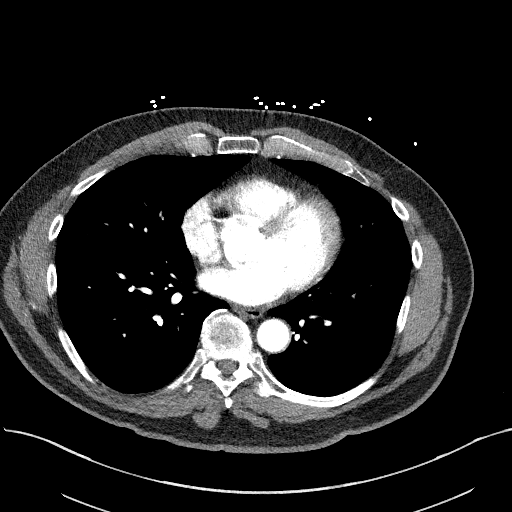
[im 168/230  bone]
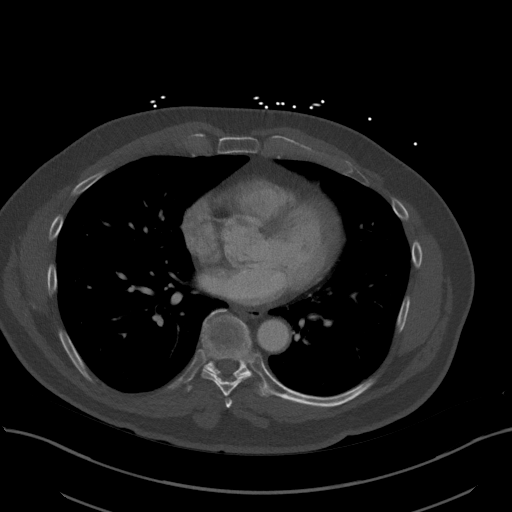
[im 199/230  soft-tissue]
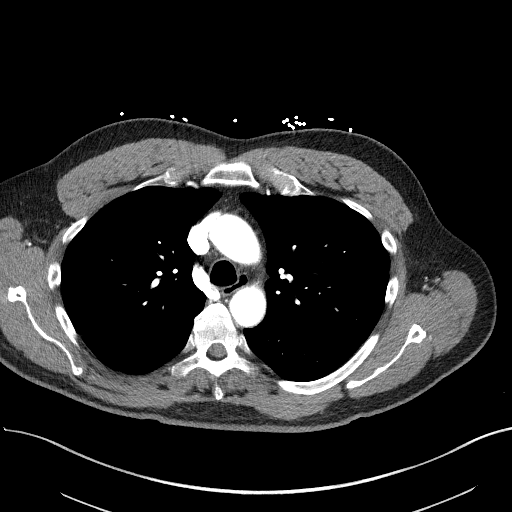
[im 214/230  soft-tissue]
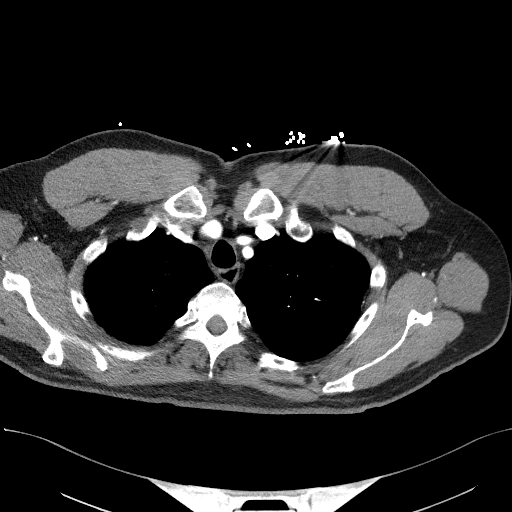

[Series 8: coronals · coronal · 0.88mm/px · 3 of 141 slices shown]
[im 36/141  soft-tissue]
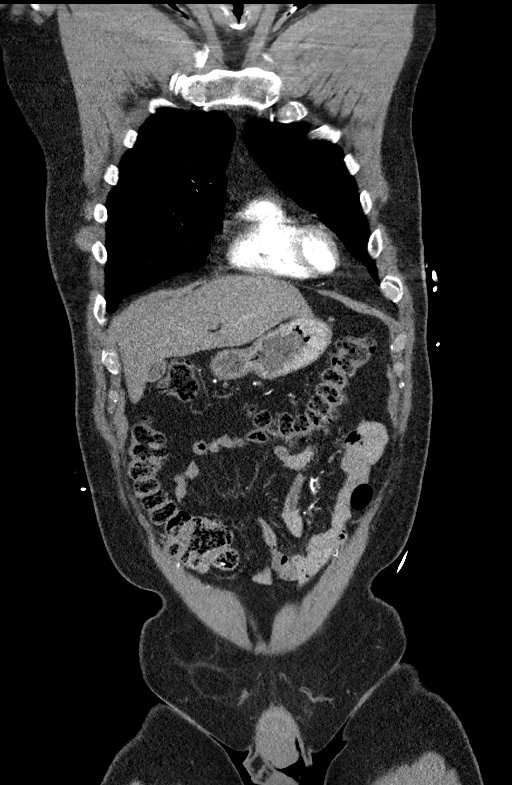
[im 71/141  soft-tissue]
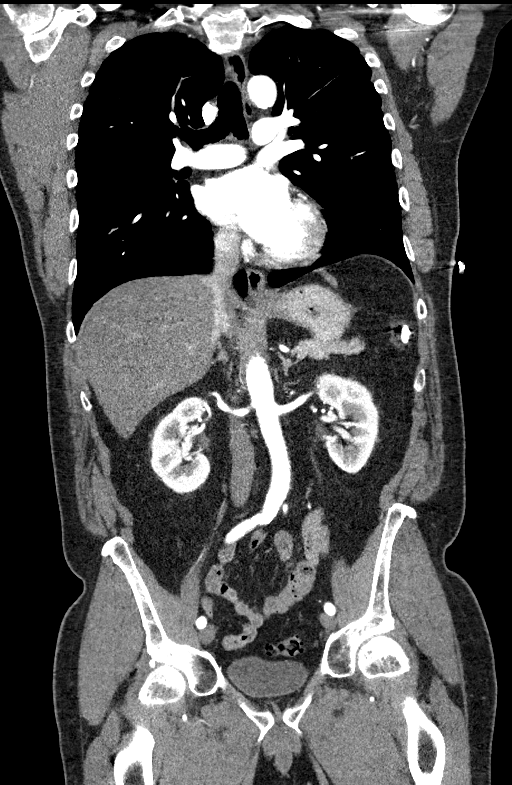
[im 106/141  soft-tissue]
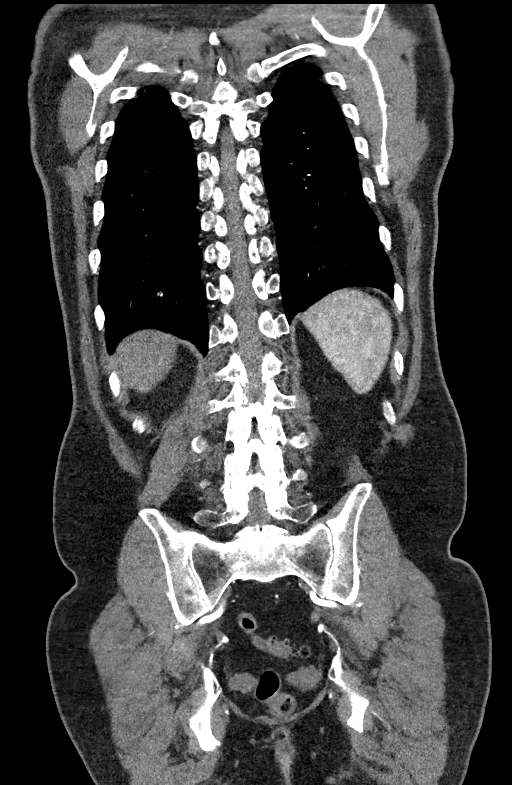

[14 of 46 positions shown; findings below may reference images not displayed]

FINDINGS: CTA CHEST FINDINGS

Cardiovascular: Dense coronary artery calcifications are noted.
Atherosclerotic changes are noted of the aorta. There is no evidence
of a dissection. No evidence of a thoracic aneurysm. Heart size is
normal. There is no pericardial effusion.

Mediastinum/Nodes: No enlarged mediastinal, hilar, or axillary lymph
nodes. Thyroid gland, trachea, and esophagus demonstrate no
significant findings. There are few calcified lymph nodes under
favored to be secondary to a prior granulomatous infection.

Lungs/Pleura: There is a tiny hyperdense nodule involving the right
lung apex (axial series 6, image 24) that is favored to represent a
calcified granuloma. The lungs are essentially clear with no
evidence of pneumothorax, effusion, or infiltrate. The trachea is
unremarkable.

Musculoskeletal: No chest wall abnormality. No acute or significant
osseous findings.

Review of the MIP images confirms the above findings.

CTA ABDOMEN AND PELVIS FINDINGS

VASCULAR

Aorta: Normal caliber aorta without aneurysm, dissection, vasculitis
or significant stenosis. Mild atherosclerotic changes are noted.

Celiac: Patent without evidence of aneurysm, dissection, vasculitis
or significant stenosis.

SMA: Patent without evidence of aneurysm, dissection, vasculitis or
significant stenosis.

Renals: Both renal arteries are patent without evidence of aneurysm,
dissection, vasculitis, fibromuscular dysplasia or significant
stenosis.

IMA: Patent without evidence of aneurysm, dissection, vasculitis or
significant stenosis.

Inflow: Patent without evidence of aneurysm, dissection, vasculitis
or significant stenosis.

Veins: No obvious venous abnormality within the limitations of this
arterial phase study.

Review of the MIP images confirms the above findings.

NON-VASCULAR

Hepatobiliary: No focal liver abnormality is seen. No gallstones,
gallbladder wall thickening, or biliary dilatation.

Pancreas: Unremarkable. No pancreatic ductal dilatation or
surrounding inflammatory changes.

Spleen: Normal in size without focal abnormality.

Adrenals/Urinary Tract: Adrenal glands are unremarkable. Kidneys are
normal, without renal calculi, focal lesion, or hydronephrosis.
Bladder is unremarkable.

Stomach/Bowel: There is severe sigmoid diverticulosis without CT
evidence of diverticulitis. The appendix is unremarkable. There is
no evidence of a small-bowel obstruction. The esophagus and stomach
are grossly unremarkable.

Lymphatic: Aortic atherosclerosis. No enlarged abdominal or pelvic
lymph nodes.

Reproductive: The prostate gland is enlarged.

Other: There are bilateral fat containing inguinal hernias, right
greater than left.

Musculoskeletal: No fracture is seen.

Review of the MIP images confirms the above findings.
IMPRESSION: 1. No dissection.  No PE.  No aortic aneurysm.
2. Atherosclerotic changes are noted throughout the thoracic and
abdominal aorta.
3. No acute intra-abdominal abnormality.
4. Severe sigmoid diverticulosis without CT evidence of
diverticulitis.
5. Enlarged prostate gland. Bilateral fat containing inguinal
hernias are noted.

## 2019-11-08 ENCOUNTER — Other Ambulatory Visit: Payer: Self-pay | Admitting: Physician Assistant

## 2020-01-05 ENCOUNTER — Other Ambulatory Visit: Payer: Self-pay | Admitting: Physician Assistant

## 2020-01-05 DIAGNOSIS — E039 Hypothyroidism, unspecified: Secondary | ICD-10-CM

## 2020-01-24 ENCOUNTER — Encounter: Payer: Self-pay | Admitting: Family

## 2020-01-24 ENCOUNTER — Ambulatory Visit (INDEPENDENT_AMBULATORY_CARE_PROVIDER_SITE_OTHER): Payer: Medicare Other | Admitting: Family

## 2020-01-24 ENCOUNTER — Other Ambulatory Visit: Payer: Self-pay

## 2020-01-24 VITALS — BP 130/70 | HR 68 | Ht 72.0 in | Wt 208.0 lb

## 2020-01-24 DIAGNOSIS — K219 Gastro-esophageal reflux disease without esophagitis: Secondary | ICD-10-CM | POA: Diagnosis not present

## 2020-01-24 DIAGNOSIS — R002 Palpitations: Secondary | ICD-10-CM

## 2020-01-24 DIAGNOSIS — Z79899 Other long term (current) drug therapy: Secondary | ICD-10-CM | POA: Diagnosis not present

## 2020-01-24 DIAGNOSIS — I5189 Other ill-defined heart diseases: Secondary | ICD-10-CM

## 2020-01-24 DIAGNOSIS — I471 Supraventricular tachycardia: Secondary | ICD-10-CM

## 2020-01-24 DIAGNOSIS — I2510 Atherosclerotic heart disease of native coronary artery without angina pectoris: Secondary | ICD-10-CM | POA: Diagnosis not present

## 2020-01-24 DIAGNOSIS — I351 Nonrheumatic aortic (valve) insufficiency: Secondary | ICD-10-CM

## 2020-01-24 DIAGNOSIS — I480 Paroxysmal atrial fibrillation: Secondary | ICD-10-CM

## 2020-01-24 DIAGNOSIS — E785 Hyperlipidemia, unspecified: Secondary | ICD-10-CM

## 2020-01-24 DIAGNOSIS — I10 Essential (primary) hypertension: Secondary | ICD-10-CM

## 2020-01-24 MED ORDER — PANTOPRAZOLE SODIUM 40 MG PO TBEC: 40.0000 mg | DELAYED_RELEASE_TABLET | Freq: Every day | ORAL | 1 refills | Status: DC

## 2020-01-24 NOTE — Patient Instructions (Addendum)
Medication Instructions:  No medication changes today.  *If you need a refill on your cardiac medications before your next appointment, please call your pharmacy*  Lab Work: None today. We will plan to do a fasting cholesterol panel at your next office visit.  If you have labs (blood work) drawn today and your tests are completely normal, you will receive your results only by: Marland Kitchen MyChart Message (if you have MyChart) OR . A paper copy in the mail If you have any lab test that is abnormal or we need to change your treatment, we will call you to review the results.  Testing/Procedures: Your EKG today showed sinus rhythm which is a stable finding.   Follow-Up: At [facility], you and your health needs are our priority.  As part of our continuing mission to provide you with exceptional heart care, we have created designated Provider Care Teams.  These Care Teams include your primary Cardiologist (physician) and Advanced Practice Providers (APPs -  Physician Assistants and Nurse Practitioners) who all work together to provide you with the care you need, when you need it.  Your next appointment:   3 month(s) -- AM appointment if possible so we can do FASTING labs  The format for your next appointment:   In Person  Provider:   You may see Kathlyn Sacramento, MD or one of the following Advanced Practice Providers on your designated Care Team:    Murray Hodgkins, NP  Christell Faith, PA-C  Marrianne Mood, PA-C  Other Instructions   If you have worsening chest pain, let our office know. You may use your nitroglycerin.  DASH Eating Plan DASH stands for "Dietary Approaches to Stop Hypertension." The DASH eating plan is a healthy eating plan that has been shown to reduce high blood pressure (hypertension). It may also reduce your risk for type 2 diabetes, heart disease, and stroke. The DASH eating plan may also help with weight loss. What are tips for following this plan?  General  guidelines  Avoid eating more than 2,300 mg (milligrams) of salt (sodium) a day. If you have hypertension, you may need to reduce your sodium intake to 1,500 mg a day.  Limit alcohol intake to no more than 1 drink a day for nonpregnant women and 2 drinks a day for men. One drink equals 12 oz of beer, 5 oz of wine, or 1 oz of hard liquor.  Work with your health care provider to maintain a healthy body weight or to lose weight. Ask what an ideal weight is for you.  Get at least 30 minutes of exercise that causes your heart to beat faster (aerobic exercise) most days of the week. Activities may include walking, swimming, or biking.  Work with your health care provider or diet and nutrition specialist (dietitian) to adjust your eating plan to your individual calorie needs. Reading food labels   Check food labels for the amount of sodium per serving. Choose foods with less than 5 percent of the Daily Value of sodium. Generally, foods with less than 300 mg of sodium per serving fit into this eating plan.  To find whole grains, look for the word "whole" as the first word in the ingredient list. Shopping  Buy products labeled as "low-sodium" or "no salt added."  Buy fresh foods. Avoid canned foods and premade or frozen meals. Cooking  Avoid adding salt when cooking. Use salt-free seasonings or herbs instead of table salt or sea salt. Check with your health care provider or  pharmacist before using salt substitutes.  Do not fry foods. Cook foods using healthy methods such as baking, boiling, grilling, and broiling instead.  Cook with heart-healthy oils, such as olive, canola, soybean, or sunflower oil. Meal planning  Eat a balanced diet that includes: ? 5 or more servings of fruits and vegetables each day. At each meal, try to fill half of your plate with fruits and vegetables. ? Up to 6-8 servings of whole grains each day. ? Less than 6 oz of lean meat, poultry, or fish each day. A 3-oz  serving of meat is about the same size as a deck of cards. One egg equals 1 oz. ? 2 servings of low-fat dairy each day. ? A serving of nuts, seeds, or beans 5 times each week. ? Heart-healthy fats. Healthy fats called Omega-3 fatty acids are found in foods such as flaxseeds and coldwater fish, like sardines, salmon, and mackerel.  Limit how much you eat of the following: ? Canned or prepackaged foods. ? Food that is high in trans fat, such as fried foods. ? Food that is high in saturated fat, such as fatty meat. ? Sweets, desserts, sugary drinks, and other foods with added sugar. ? Full-fat dairy products.  Do not salt foods before eating.  Try to eat at least 2 vegetarian meals each week.  Eat more home-cooked food and less restaurant, buffet, and fast food.  When eating at a restaurant, ask that your food be prepared with less salt or no salt, if possible. What foods are recommended? The items listed may not be a complete list. Talk with your dietitian about what dietary choices are best for you. Grains Whole-grain or whole-wheat bread. Whole-grain or whole-wheat pasta. Brown rice. [place]. Bulgur. Whole-grain and low-sodium cereals. Pita bread. Low-fat, low-sodium crackers. Whole-wheat flour tortillas. Vegetables Fresh or frozen vegetables (raw, steamed, roasted, or grilled). Low-sodium or reduced-sodium tomato and vegetable juice. Low-sodium or reduced-sodium tomato sauce and tomato paste. Low-sodium or reduced-sodium canned vegetables. Fruits All fresh, dried, or frozen fruit. Canned fruit in natural juice (without added sugar). Meat and other protein foods Skinless chicken or [place]. Ground chicken or [place]. Pork with fat trimmed off. Fish and seafood. Egg whites. Dried beans, peas, or lentils. Unsalted nuts, nut butters, and seeds. Unsalted canned beans. Lean cuts of beef with fat trimmed off. Low-sodium, lean deli meat. Dairy Low-fat (1%) or fat-free (skim) milk.  Fat-free, low-fat, or reduced-fat cheeses. Nonfat, low-sodium ricotta or cottage cheese. Low-fat or nonfat yogurt. Low-fat, low-sodium cheese. Fats and oils Soft margarine without trans fats. Vegetable oil. Low-fat, reduced-fat, or light mayonnaise and salad dressings (reduced-sodium). Canola, safflower, olive, soybean, and sunflower oils. Avocado. Seasoning and other foods Herbs. Spices. Seasoning mixes without salt. Unsalted popcorn and pretzels. Fat-free sweets. What foods are not recommended? The items listed may not be a complete list. Talk with your dietitian about what dietary choices are best for you. Grains Baked goods made with fat, such as croissants, muffins, or some breads. Dry pasta or rice meal packs. Vegetables Creamed or fried vegetables. Vegetables in a cheese sauce. Regular canned vegetables (not low-sodium or reduced-sodium). Regular canned tomato sauce and paste (not low-sodium or reduced-sodium). Regular tomato and vegetable juice (not low-sodium or reduced-sodium). Angie Fava. Olives. Fruits Canned fruit in a light or heavy syrup. Fried fruit. Fruit in cream or butter sauce. Meat and other protein foods Fatty cuts of meat. Ribs. Fried meat. Berniece Salines. Sausage. Bologna and other processed lunch meats. Salami. Fatback. Hotdogs. Bratwurst. Salted  nuts and seeds. Canned beans with added salt. Canned or smoked fish. Whole eggs or egg yolks. Chicken or [place] with skin. Dairy Whole or 2% milk, cream, and half-and-half. Whole or full-fat cream cheese. Whole-fat or sweetened yogurt. Full-fat cheese. Nondairy creamers. Whipped toppings. Processed cheese and cheese spreads. Fats and oils Butter. Stick margarine. Lard. Shortening. Ghee. Bacon fat. Tropical oils, such as coconut, palm kernel, or palm oil. Seasoning and other foods Salted popcorn and pretzels. Onion salt, garlic salt, seasoned salt, table salt, and sea salt. Worcestershire sauce. Tartar sauce. Barbecue sauce. Teriyaki sauce.  Soy sauce, including reduced-sodium. Steak sauce. Canned and packaged gravies. Fish sauce. Oyster sauce. Cocktail sauce. Horseradish that you find on the shelf. Ketchup. Mustard. Meat flavorings and tenderizers. Bouillon cubes. Hot sauce and Tabasco sauce. Premade or packaged marinades. Premade or packaged taco seasonings. Relishes. Regular salad dressings. Where to find more information:  National Heart, Lung, and [place]: https://wilson-eaton.com/  American Heart Association: www.heart.org Summary  The DASH eating plan is a healthy eating plan that has been shown to reduce high blood pressure (hypertension). It may also reduce your risk for type 2 diabetes, heart disease, and stroke.  With the DASH eating plan, you should limit salt (sodium) intake to 2,300 mg a day. If you have hypertension, you may need to reduce your sodium intake to 1,500 mg a day.  When on the DASH eating plan, aim to eat more fresh fruits and vegetables, whole grains, lean proteins, low-fat dairy, and heart-healthy fats.  Work with your health care provider or diet and nutrition specialist (dietitian) to adjust your eating plan to your individual calorie needs. This information is not intended to replace advice given to you by your health care provider. Make sure you discuss any questions you have with your health care provider. Document Revised: 11/05/2017 Document Reviewed: 11/16/2016 Elsevier Patient Education  2020 [employer].

## 2020-01-24 NOTE — Progress Notes (Signed)
Office Visit    Patient Name: [name] [name] Date of Encounter: 01/24/2020  Primary Care Provider:  Trinna Post, PA-C Primary Cardiologist:  Kathlyn Sacramento, MD Electrophysiologist:  None   Chief Complaint    [name] [name] is a 69 y.o. male with a hx of CAD s/p PCI/DES to LAD 06/2019, PAF diagnosed in 2004 not on [place] in setting of CHADS2VASC of 1, paroxysmal SVT, mild AI, diastolic dysfunction, hypothyroidism on Synthroid, severe possibly familial HLD, elevated liver function, strong family history of CAD on father's side. He presents today for follow up of CAD.    Past Medical History    Past Medical History:  Diagnosis Date  . Acquired hypothyroidism   . Coronary artery disease    a. cath 06/2019 s/p  orbital atherectomy and 2 overlapped DES to the mid and proximal LAD; angiopasty to 2nd dig  . ED (erectile dysfunction)   . Paroxysmal atrial fibrillation (HCC)    Diagnosed in 2004  . Paroxysmal SVT (supraventricular tachycardia) ([place])   . Pure hypercholesterolemia    Past Surgical History:  Procedure Laterality Date  . CORONARY ATHERECTOMY N/A 07/05/2019   Procedure: CORONARY ATHERECTOMY;  Surgeon: Wellington Hampshire, MD;  Location: [facility];  Service: Cardiovascular;  Laterality: N/A;  . CORONARY BALLOON ANGIOPLASTY N/A 07/05/2019   Procedure: CORONARY BALLOON ANGIOPLASTY;  Surgeon: Wellington Hampshire, MD;  Location: [facility];  Service: Cardiovascular;  Laterality: N/A;  . CORONARY STENT INTERVENTION N/A 07/05/2019   Procedure: CORONARY STENT INTERVENTION;  Surgeon: Wellington Hampshire, MD;  Location: [facility];  Service: Cardiovascular;  Laterality: N/A;  . LEFT HEART CATH AND CORONARY ANGIOGRAPHY Left 06/26/2019   Procedure: LEFT HEART CATH AND CORONARY ANGIOGRAPHY;  Surgeon: Nelva Bush, MD;  Location: [facility];  Service: Cardiovascular;  Laterality: Left;  Marland Kitchen MELANOMA EXCISION    . NECK SURGERY      Allergies  Allergies   Allergen Reactions  . Hydrocodone-Acetaminophen Nausea And Vomiting and Other (See Comments)  . Vicodin [Hydrocodone-Acetaminophen] Nausea And Vomiting    Vomiting    History of Present Illness    [name] [name] is a 69 y.o. male with a hx of CAD s/p PCI/DES to LAD 06/2019, PAF diagnosed in 2004 not on [place] in setting of CHADS2VASC of 1, paroxysmal SVT, mild AI, diastolic dysfunction, hypothyroidism on Synthroid, severe possibly familial HLD, elevated liver function, strong family history of CAD on father's side. He was last seen 08/2019 by Christell Faith, PA.  Previously managed by Dr. Dion Saucier in [place], [place]. Diagnosed with atrial fibrillation in 2004 managed with metoprolol with was subsequently discontinued due to nightmares. Since managed on Diltiazem. Stress echo 2013 unremarkable. Holter 12/2014 predominantly SR, rare isolated PVCs, frequent isolated PACs, 6 atrial couplets, 3 runs of SVT with no atrial fib. Treadmill stress test 03/2018 with no evidence ischemia. Evaluated for increased palpitation 04/2019 which improved with reduction in caffeine intake.   Chest pain here and there that lasted less than 1 day. Happens with activity. Tells me it happens once per month. Has not had to take his nitroglycerin.   After changing Diltiazem from 180 to 240mg  reports fewer episodes of palpitations. Occurs in the evenings primarily but has improved. Notices triggered by caffeine. Not associated with pain or shortness of breath.  No formal exercise regimen but does try to stay busy. Endorses eating a heart healthy diet. Reports compliance with his medications.    EKGs/Labs/Other Studies Reviewed:  The following studies were reviewed today:  EKG:  EKG is ordered today.  The ekg ordered today demonstrates SR 68 bpm with nonspecific T wave changes.   Recent Labs: 05/04/2019: Magnesium 2.2; TSH 2.987 08/17/2019: ALT 56; BUN 18; Creatinine, Ser 0.91; Hemoglobin 14.5; Platelets 166; Potassium 4.2; Sodium 141   Recent Lipid Panel    Component Value Date/Time   CHOL 159 08/17/2019 0950   TRIG 105 08/17/2019 0950   HDL 51 08/17/2019 0950   CHOLHDL 3.1 08/17/2019 0950   CHOLHDL 3.2 05/11/2018 0845   VLDL 26 05/11/2018 0845   LDLCALC 89 08/17/2019 0950    Home Medications   No outpatient medications have been marked as taking for the 01/24/20 encounter (Appointment) with Loel Dubonnet, NP.      Review of Systems      Review of Systems  Constitution: Negative for chills, fever and malaise/fatigue.  Cardiovascular: Positive for palpitations. Negative for chest pain, dyspnea on exertion, leg swelling, near-syncope, orthopnea and syncope.  Respiratory: Negative for cough, shortness of breath and wheezing.   Gastrointestinal: Negative for nausea and vomiting.  Neurological: Negative for dizziness, light-headedness and weakness.   All other systems reviewed and are otherwise negative except as noted above.  Physical Exam    VS:  There were no vitals taken for this visit. , BMI There is no height or weight on file to calculate BMI. GEN: Well nourished, well developed, in no acute distress. HEENT: normal. Neck: Supple, no JVD, carotid bruits, or masses. Cardiac: RRR, no murmurs, rubs, or gallops. No clubbing, cyanosis, edema.  Radials/DP/PT 2+ and equal bilaterally.  Respiratory:  Respirations regular and unlabored, clear to auscultation bilaterally. GI: Soft, nontender, nondistended, BS + x 4. MS: No deformity or atrophy. Skin: Warm and dry, no rash. Neuro:  Strength and sensation are intact. Psych: Normal affect.   Assessment & Plan    1. CAD - s/p DES 06/2019. Stable anginal symptoms. No indication for ischemic evaluation at this time. Reports intermittent fleeting chest pain at rest that does not require nitroglycerin, he will let us know if it worsens. Recommend regular cardiovascular exercise. Continue GDMT DAPT (Aspirin/Plavix), CCB, statin. Will not add beta blocker secondary  to resting bradycardia.   2. PAF - No symptoms concerning for sustained arrhythmia. Isolated episode back in 2004 with no evidence of recurrence. Remain off anticoagulation in setting of need for DAPT.   3. Paroxysmal SVT - Mild brief episodes palpitations at night when lying down. He was previous offered increased dose of Diltiazem, but he continues to politely decline today. Feels his symptoms are controlled well enough. Recommend avoidance of caffeine.   4. Aortic valve regurgitation - Asymptomatic. Noted on echo 04/2019. No signs of volume overload, continue to monitor. Continue optimal BP control.   5. Diastolic dysfunction - Euvolemic and well compensated on exam. No need for diuretic at this time. Recommend low sodium diet.    6. HLD - LDL 08/2019 was 89. Tells me he was not fasting. He will make dietary and lifestyle changes and have fasting lipid panel at next office visit. Continue Crestor 40mg  and Zetia 10mg . If LDL remains >70 will require PCSK9.   Disposition: Follow up in 3 month(s) with Dr. Fletcher Anon or APP   Loel Dubonnet, NP 01/24/2020, 10:22 AM

## 2020-01-31 ENCOUNTER — Ambulatory Visit: Payer: Medicare Other | Admitting: Physician Assistant

## 2020-01-31 ENCOUNTER — Other Ambulatory Visit: Payer: Self-pay

## 2020-01-31 ENCOUNTER — Encounter: Payer: Self-pay | Admitting: Physician Assistant

## 2020-01-31 VITALS — BP 133/77 | HR 76 | Temp 96.2°F | Wt 208.2 lb

## 2020-01-31 DIAGNOSIS — K219 Gastro-esophageal reflux disease without esophagitis: Secondary | ICD-10-CM | POA: Diagnosis not present

## 2020-01-31 DIAGNOSIS — I471 Supraventricular tachycardia, unspecified: Secondary | ICD-10-CM

## 2020-01-31 DIAGNOSIS — E785 Hyperlipidemia, unspecified: Secondary | ICD-10-CM

## 2020-01-31 DIAGNOSIS — E039 Hypothyroidism, unspecified: Secondary | ICD-10-CM | POA: Diagnosis not present

## 2020-01-31 DIAGNOSIS — I25118 Atherosclerotic heart disease of native coronary artery with other forms of angina pectoris: Secondary | ICD-10-CM

## 2020-01-31 MED ORDER — DEXILANT 30 MG PO CPDR: 30.0000 mg | DELAYED_RELEASE_CAPSULE | Freq: Two times a day (BID) | ORAL | 0 refills | Status: DC

## 2020-01-31 NOTE — Progress Notes (Signed)
Patient: [name] [name] Male    DOB: Feb 11, 1951   69 y.o.   MRN: [id number] Visit Date: 01/31/2020  Today's Provider: Trinna Post, PA-C   Chief Complaint  Patient presents with  . Hypothyroidism  . Hyperlipidemia  . Abdominal Pain   Subjective:    I, Porsha McClurkin,CMA am acting as a Education administrator for [employer].  Abdominal Pain This is a new problem. The current episode started 1 to 4 weeks ago. The problem occurs intermittently. The problem has been unchanged. The pain is located in the RUQ and LUQ. The pain is at a severity of 7/10. The pain is severe. The quality of the pain is cramping. The abdominal pain does not radiate. Associated symptoms include belching and nausea. Pertinent negatives include no constipation or diarrhea. The pain is aggravated by eating. The pain is relieved by eating. His past medical history is significant for GERD.    Hypothyroid, follow-up:  TSH  Date Value Ref Range Status  05/04/2019 2.987 0.350 - 4.500 uIU/mL Final    Comment:    Performed by a 3rd Generation assay with a functional sensitivity of <=0.01 uIU/mL. Performed at [facility], [address] [place], [place] [address]    Wt Readings from Last 3 Encounters:  01/31/20 208 lb 3.2 oz (94.4 kg)  01/24/20 208 lb (94.3 kg)  09/22/19 207 lb (93.9 kg)    He was last seen for hypothyroid 6 months ago.  Management since that visit includes started levothyroxine 25 MCG tablet; Take 1 tablet (25 mcg total) by mouth daily before breakfast. He reports good compliance with treatment. He is not having side effects.  He is exercising. He is experiencing none He denies change in energy level, diarrhea, heat / cold intolerance, nervousness, palpitations and weight changes Weight trend: stable  ------------------------------------------------------------------------  Lipid/Cholesterol, Follow-up:   Last seen for this6 months ago.  Management changes since that visit  include no changes. . Last Lipid Panel:    Component Value Date/Time   CHOL 159 08/17/2019 0950   TRIG 105 08/17/2019 0950   HDL 51 08/17/2019 0950   CHOLHDL 3.1 08/17/2019 0950   CHOLHDL 3.2 05/11/2018 0845   VLDL 26 05/11/2018 0845   LDLCALC 89 08/17/2019 0950    Risk factors for vascular disease include hypercholesterolemia  He reports good compliance with treatment. He is not having side effects.  Current symptoms include none and have been stable. Weight trend: stable Prior visit with dietician: no Current diet: well balanced Current exercise: walking  Wt Readings from Last 3 Encounters:  01/31/20 208 lb 3.2 oz (94.4 kg)  01/24/20 208 lb (94.3 kg)  09/22/19 207 lb (93.9 kg)   Reports he is due for colonoscopy but not able to do it until off of blood thinners. He is currently on Plavix for CAD. He reports he had diverticulosis on prior colonoscopies. He has never had diverticulitis.  Reports he had some gurgling in his left lower quadrant. Regular bowel movements. Previously taking Nexium and prrilosec for GERD. He was switched to pantoprazole after heart surgery.  -------------------------------------------------------------------     Allergies  Allergen Reactions  . Hydrocodone-Acetaminophen Nausea And Vomiting and Other (See Comments)  . Vicodin [Hydrocodone-Acetaminophen] Nausea And Vomiting    Vomiting     Current Outpatient Medications:  .  aspirin EC 81 MG tablet, Take 81 mg by mouth at bedtime., Disp: , Rfl:  .  cetirizine (ZYRTEC) 10 MG tablet, Take 10 mg by  mouth at bedtime. , Disp: , Rfl:  .  clopidogrel (PLAVIX) 75 MG tablet, Take 1 tablet (75 mg total) by mouth daily., Disp: 30 tablet, Rfl: 11 .  DILT-XR 180 MG 24 hr capsule, Take 1 capsule by mouth once daily, Disp: 90 capsule, Rfl: 0 .  ezetimibe (ZETIA) 10 MG tablet, TAKE 1 TABLET BY MOUTH ONCE DAILY (Patient taking differently: Take 10 mg by mouth every evening. ), Disp: 90 tablet, Rfl: 3 .   levothyroxine (SYNTHROID) 25 MCG tablet, TAKE 1 TABLET BY MOUTH ONCE DAILY BEFORE  BREAKFAST, Disp: 90 tablet, Rfl: 0 .  Multiple Vitamin (MULTIVITAMIN) tablet, Take 1 tablet by mouth daily., Disp: , Rfl:  .  nitroGLYCERIN (NITROSTAT) 0.4 MG SL tablet, Place 1 tablet (0.4 mg total) under the tongue every 5 (five) minutes as needed for chest pain., Disp: 25 tablet, Rfl: 3 .  pantoprazole (PROTONIX) 40 MG tablet, Take 1 tablet (40 mg total) by mouth daily., Disp: 90 tablet, Rfl: 1 .  rosuvastatin (CRESTOR) 40 MG tablet, Take 1 tablet (40 mg total) by mouth daily. (Patient taking differently: Take 40 mg by mouth every evening. ), Disp: 90 tablet, Rfl: 3 .  tadalafil (CIALIS) 20 MG tablet, 1 tab as needed 1 hour prior to intercourse, Disp: 30 tablet, Rfl: 0  Review of Systems  Constitutional: Negative.   Respiratory: Negative.   Cardiovascular: Negative.   Gastrointestinal: Positive for abdominal pain and nausea. Negative for constipation and diarrhea.  Neurological: Negative.   Hematological: Negative.     Social History   Tobacco Use  . Smoking status: Never Smoker  . Smokeless tobacco: Never Used  Substance Use Topics  . Alcohol use: Yes    Comment: Occ.       Objective:   BP 133/77 (BP Location: Left Arm, Patient Position: Sitting, Cuff Size: Normal)   Pulse 76   Temp (!) 96.2 F (35.7 C) (Temporal)   Wt 208 lb 3.2 oz (94.4 kg)   BMI 28.24 kg/m  Vitals:   01/31/20 1043  BP: 133/77  Pulse: 76  Temp: (!) 96.2 F (35.7 C)  TempSrc: Temporal  Weight: 208 lb 3.2 oz (94.4 kg)  Body mass index is 28.24 kg/m.   Physical Exam Constitutional:      Appearance: He is well-developed.  Cardiovascular:     Rate and Rhythm: Normal rate and regular rhythm.  Pulmonary:     Effort: Pulmonary effort is normal.     Breath sounds: Normal breath sounds.  Abdominal:     General: There is no distension.     Palpations: Abdomen is soft.     Tenderness: There is no abdominal  tenderness.  Skin:    General: Skin is warm and dry.  Neurological:     Mental Status: He is alert and oriented to person, place, and time.  Psychiatric:        Mood and Affect: Mood normal.        Behavior: Behavior normal.      No results found for any visits on 01/31/20.     Assessment & Plan    1. Gastroesophageal reflux disease, unspecified whether esophagitis present  - Dexlansoprazole (DEXILANT) 30 MG capsule; Take 1 capsule (30 mg total) by mouth in the morning and at bedtime.  Dispense: 180 capsule; Refill: 0  2. Acquired hypothyroidism  - TSH  3. Hyperlipidemia LDL goal <70  - Lipid Profile - Comprehensive Metabolic Panel (CMET) - CBC with Differential  4. SVT (supraventricular tachycardia) (  [place])   5. Atherosclerotic heart disease of native coronary artery with other forms of angina pectoris [facility])  The entirety of the information documented in the History of Present Illness, Review of Systems and Physical Exam were personally obtained by me. Portions of this information were initially documented by [facility] and reviewed by me for thoroughness and accuracy.        Trinna Post, PA-C  [facility]

## 2020-01-31 NOTE — Patient Instructions (Signed)
Hypothyroidism  Hypothyroidism is when the thyroid gland does not make enough of certain hormones (it is underactive). The thyroid gland is a small gland located in the lower front part of the neck, just in front of the windpipe (trachea). This gland makes hormones that help control how the body uses food for energy (metabolism) as well as how the heart and brain function. These hormones also play a role in keeping your bones strong. When the thyroid is underactive, it produces too little of the hormones thyroxine (T4) and triiodothyronine (T3). What are the causes? This condition may be caused by:  Hashimoto's disease. This is a disease in which the body's disease-fighting system (immune system) attacks the thyroid gland. This is the most common cause.  Viral infections.  Pregnancy.  Certain medicines.  Birth defects.  Past radiation treatments to the head or neck for cancer.  Past treatment with radioactive iodine.  Past exposure to radiation in the environment.  Past surgical removal of part or all of the thyroid.  Problems with a gland in the center of the brain (pituitary gland).  Lack of enough iodine in the diet. What increases the risk? You are more likely to develop this condition if:  You are male.  You have a family history of thyroid conditions.  You use a medicine called lithium.  You take medicines that affect the immune system (immunosuppressants). What are the signs or symptoms? Symptoms of this condition include:  Feeling as though you have no energy (lethargy).  Not being able to tolerate cold.  Weight gain that is not explained by a change in diet or exercise habits.  Lack of appetite.  Dry skin.  Coarse hair.  Menstrual irregularity.  Slowing of thought processes.  Constipation.  Sadness or depression. How is this diagnosed? This condition may be diagnosed based on:  Your symptoms, your medical history, and a physical exam.  Blood  tests. You may also have imaging tests, such as an ultrasound or MRI. How is this treated? This condition is treated with medicine that replaces the thyroid hormones that your body does not make. After you begin treatment, it may take several weeks for symptoms to go away. Follow these instructions at home:  Take over-the-counter and prescription medicines only as told by your health care provider.  If you start taking any new medicines, tell your health care provider.  Keep all follow-up visits as told by your health care provider. This is important. ? As your condition improves, your dosage of thyroid hormone medicine may change. ? You will need to have blood tests regularly so that your health care provider can monitor your condition. Contact a health care provider if:  Your symptoms do not get better with treatment.  You are taking thyroid replacement medicine and you: ? Sweat a lot. ? Have tremors. ? Feel anxious. ? Lose weight rapidly. ? Cannot tolerate heat. ? Have emotional swings. ? Have diarrhea. ? Feel weak. Get help right away if you have:  Chest pain.  An irregular heartbeat.  A rapid heartbeat.  Difficulty breathing. Summary  Hypothyroidism is when the thyroid gland does not make enough of certain hormones (it is underactive).  When the thyroid is underactive, it produces too little of the hormones thyroxine (T4) and triiodothyronine (T3).  The most common cause is Hashimoto's disease, a disease in which the body's disease-fighting system (immune system) attacks the thyroid gland. The condition can also be caused by viral infections, medicine, pregnancy, or past   radiation treatment to the head or neck.  Symptoms may include weight gain, dry skin, constipation, feeling as though you do not have energy, and not being able to tolerate cold.  This condition is treated with medicine to replace the thyroid hormones that your body does not make. This information  is not intended to replace advice given to you by your health care provider. Make sure you discuss any questions you have with your health care provider. Document Revised: 11/05/2017 Document Reviewed: 11/03/2017 Elsevier Patient Education  2020 [employer]  

## 2020-02-01 ENCOUNTER — Telehealth: Payer: Self-pay | Admitting: Physician Assistant

## 2020-02-01 NOTE — Telephone Encounter (Signed)
[name] [name], with [facility], requesting to speak with CMA regarding Dexlansoprazole (DEXILANT) 30 MG capsule  .     CB#  [phone]

## 2020-02-02 NOTE — Telephone Encounter (Signed)
Spoke w/ Sharyn Lull @ [address] and she states that the Dexilant is a 4 tier medication and it was approved but patient would have to pay $275.90 out of pocket due to the level of medication it is. I advised Sharyn Lull that patient has tried Nexium, omeprazole and pantoprazole already. Also [facility] verified that the cost for patient is $275.90. Please advise.

## 2020-02-05 ENCOUNTER — Other Ambulatory Visit: Payer: Self-pay | Admitting: Cardiovascular Disease

## 2020-02-05 ENCOUNTER — Other Ambulatory Visit: Payer: Self-pay | Admitting: Physician Assistant

## 2020-02-05 DIAGNOSIS — R079 Chest pain, unspecified: Secondary | ICD-10-CM

## 2020-02-06 NOTE — Telephone Encounter (Signed)
Called patient and no answer left voicemail message for patient to return call. If patient calls back ok for PEC to advise patient of message.

## 2020-02-06 NOTE — Telephone Encounter (Signed)
He might have to return to his pantoprazole seeing as it is not an issue of approval but a matter of cost. He might check goodrx to see if there is a coupon. The remainder of the other medications interact with Plavix.

## 2020-02-14 NOTE — Telephone Encounter (Signed)
Spoke to patient and he states that she switched back to the pantoprazole. FYI

## 2020-03-19 NOTE — Progress Notes (Addendum)
Established patient visit   I,[name] [name],acting as a scribe for Trinna Post, PA-C.,have documented all relevant documentation on the behalf of Trinna Post, PA-C,as directed by  Trinna Post, PA-C while in the presence of Trinna Post, PA-C.   Patient: [name] [name]   DOB: 05-Feb-1951   69 y.o. Male  MRN: [id number] Visit Date: 03/19/2020  Today's healthcare provider: Trinna Post, PA-C  Subjective:    Chief Complaint  Patient presents with  . Lump in groin   Leg Pain  The incident occurred more than 1 week ago. There was no injury mechanism. The pain is present in the left leg. The quality of the pain is described as burning. The pain is at a severity of 5/10. The pain is mild. The pain has been intermittent since onset. Associated symptoms include a loss of motion, numbness and tingling. Pertinent negatives include no inability to bear weight. He reports no foreign bodies present. The symptoms are aggravated by movement. He has tried acetaminophen for the symptoms. The treatment provided mild relief.  Patient reports buldge in left groin area that gets larger with standing and smaller while lying down.    Patient Active Problem List   Diagnosis Date Noted  . SVT (supraventricular tachycardia) ([place]) 07/06/2019  . NSVT (nonsustained ventricular tachycardia) ([place]) 07/06/2019  . Status post coronary artery stent placement   . Hyperlipidemia LDL goal <70   . Effort angina ([place]) 07/05/2019  . Accelerating angina ([place]) 06/20/2019  . Acquired hypothyroidism 04/02/2016  . Pure hypercholesterolemia 04/02/2016  . Paroxysmal atrial fibrillation ([place]) 12/14/2014  . Dyslipidemia 12/14/2014   Past Medical History:  Diagnosis Date  . Acquired hypothyroidism   . Coronary artery disease    a. cath 06/2019 s/p  orbital atherectomy and 2 overlapped DES to the mid and proximal LAD; angiopasty to 2nd dig  . ED (erectile dysfunction)   . Paroxysmal atrial fibrillation (HCC)     Diagnosed in 2004  . Paroxysmal SVT (supraventricular tachycardia) ([place])   . Pure hypercholesterolemia    Social History   Socioeconomic History  . Marital status: Married    Spouse name: Morey Hummingbird   . Number of children: 2  . Years of education: Not on file  . Highest education level: Not on file  Occupational History  . Not on file  Tobacco Use  . Smoking status: Never Smoker  . Smokeless tobacco: Never Used  Substance and Sexual Activity  . Alcohol use: Yes    Comment: Occ.   . Drug use: No  . Sexual activity: Yes    Birth control/protection: None  Other Topics Concern  . Not on file  Social History Narrative  . Not on file   Social Determinants of Health   Financial Resource Strain:   . Difficulty of Paying Living Expenses:   Food Insecurity: No Food Insecurity  . Worried About Charity fundraiser in the Last Year: Never true  . Ran Out of Food in the Last Year: Never true  Transportation Needs: No Transportation Needs  . Lack of Transportation (Medical): No  . Lack of Transportation (Non-Medical): No  Physical Activity:   . Days of Exercise per Week:   . Minutes of Exercise per Session:   Stress: No Stress Concern Present  . Feeling of Stress : Only a little  Social Connections: Unknown  . Frequency of Communication with Friends and Family: More than three times a week  . Frequency of Social Gatherings  with Friends and Family: Not on file  . Attends Religious Services: Not on file  . Active Member of Clubs or Organizations: Not on file  . Attends Archivist Meetings: Not on file  . Marital Status: Not on file  Intimate Partner Violence: Not At Risk  . Fear of Current or Ex-Partner: No  . Emotionally Abused: No  . Physically Abused: No  . Sexually Abused: No   Allergies  Allergen Reactions  . Hydrocodone-Acetaminophen Nausea And Vomiting and Other (See Comments)  . Vicodin [Hydrocodone-Acetaminophen] Nausea And Vomiting    Vomiting        Medications: Outpatient Medications Prior to Visit  Medication Sig  . aspirin EC 81 MG tablet Take 81 mg by mouth at bedtime.  . cetirizine (ZYRTEC) 10 MG tablet Take 10 mg by mouth at bedtime.   . clopidogrel (PLAVIX) 75 MG tablet Take 1 tablet (75 mg total) by mouth daily.  Marland Kitchen Dexlansoprazole (DEXILANT) 30 MG capsule Take 1 capsule (30 mg total) by mouth in the morning and at bedtime.  Marland Kitchen DILT-XR 180 MG 24 hr capsule Take 1 capsule by mouth once daily  . ezetimibe (ZETIA) 10 MG tablet Take 1 tablet (10 mg total) by mouth every evening.  Marland Kitchen levothyroxine (SYNTHROID) 25 MCG tablet TAKE 1 TABLET BY MOUTH ONCE DAILY BEFORE  BREAKFAST  . Multiple Vitamin (MULTIVITAMIN) tablet Take 1 tablet by mouth daily.  . nitroGLYCERIN (NITROSTAT) 0.4 MG SL tablet Place 1 tablet (0.4 mg total) under the tongue every 5 (five) minutes as needed for chest pain.  . pantoprazole (PROTONIX) 40 MG tablet Take 40 mg by mouth daily.  . rosuvastatin (CRESTOR) 40 MG tablet Take 1 tablet (40 mg total) by mouth daily. (Patient taking differently: Take 40 mg by mouth every evening. )  . tadalafil (CIALIS) 20 MG tablet 1 tab as needed 1 hour prior to intercourse   No facility-administered medications prior to visit.    Review of Systems  Constitutional: Negative.   HENT: Negative.   Eyes: Negative.   Respiratory: Negative.   Cardiovascular: Negative.   Gastrointestinal: Negative.   Endocrine: Negative.   Genitourinary: Negative.   Musculoskeletal: Negative.   Skin: Negative.   Allergic/Immunologic: Negative.   Neurological: Positive for tingling and numbness.  Hematological: Negative.   Psychiatric/Behavioral: Negative.     Last CBC Lab Results  Component Value Date   WBC 4.3 08/17/2019   HGB 14.5 08/17/2019   HCT 42.8 08/17/2019   MCV 87 08/17/2019   MCH 29.6 08/17/2019   RDW 13.6 08/17/2019   PLT 166 [id number]   Last metabolic panel Lab Results  Component Value Date   GLUCOSE 100 (H) 08/17/2019    NA 141 08/17/2019   K 4.2 08/17/2019   CL 105 08/17/2019   CO2 24 08/17/2019   BUN 18 08/17/2019   CREATININE 0.91 08/17/2019   GFRNONAA 86 08/17/2019   GFRAA 99 08/17/2019   CALCIUM 10.6 (H) 08/17/2019   PROT 6.5 08/17/2019   ALBUMIN 4.8 08/17/2019   LABGLOB 1.7 08/17/2019   AGRATIO 2.8 (H) 08/17/2019   BILITOT 0.5 08/17/2019   ALKPHOS 77 08/17/2019   AST 40 08/17/2019   ALT 56 (H) 08/17/2019   ANIONGAP 8 07/06/2019        Objective:    There were no vitals taken for this visit. BP Readings from Last 3 Encounters:  01/31/20 133/77  01/24/20 130/70  09/22/19 130/73      Physical Exam Constitutional:  Appearance: Normal appearance.  Cardiovascular:     Rate and Rhythm: Normal rate.  Pulmonary:     Effort: Pulmonary effort is normal.  Abdominal:     General: Bowel sounds are normal.     Tenderness: There is abdominal tenderness in the left lower quadrant.     Hernia: A hernia is present. Hernia is present in the left inguinal area.     Comments: Mild tenderness to palpation over hernia.  Skin:    General: Skin is warm and dry.  Neurological:     Mental Status: He is alert and oriented to person, place, and time.  Psychiatric:        Mood and Affect: Mood normal.        Behavior: Behavior normal.       No results found for any visits on 03/20/20.    Assessment & Plan:    1. Left inguinal hernia Avoid heavy lifting, no more than 10 lbs. Counseled on return precautions. Refer to surgery as below.  - Ambulatory referral to [facility]       Paulene Floor  [facility] [phone] (phone) [phone] (fax)  [place]

## 2020-03-20 ENCOUNTER — Ambulatory Visit (INDEPENDENT_AMBULATORY_CARE_PROVIDER_SITE_OTHER): Payer: Medicare Other | Admitting: Physician Assistant

## 2020-03-20 ENCOUNTER — Other Ambulatory Visit: Payer: Self-pay

## 2020-03-20 VITALS — BP 137/65 | HR 97 | Temp 96.9°F | Ht 72.0 in | Wt 208.0 lb

## 2020-03-20 DIAGNOSIS — K4090 Unilateral inguinal hernia, without obstruction or gangrene, not specified as recurrent: Secondary | ICD-10-CM | POA: Diagnosis not present

## 2020-03-20 NOTE — Patient Instructions (Signed)
° °Inguinal Hernia, Adult °An inguinal hernia is when fat or your intestines push through a weak spot in a muscle where your leg meets your lower belly (groin). This causes a rounded lump (bulge). This kind of hernia could also be: °· In your scrotum, if you are male. °· In folds of skin around your vagina, if you are male. °There are three types of inguinal hernias. These include: °· Hernias that can be pushed back into the belly (are reducible). This type rarely causes pain. °· Hernias that cannot be pushed back into the belly (are incarcerated). °· Hernias that cannot be pushed back into the belly and lose their blood supply (are strangulated). This type needs emergency surgery. °If you do not have symptoms, you may not need treatment. If you have symptoms or a large hernia, you may need surgery. °Follow these instructions at home: °Lifestyle °· Do these things if told by your doctor so you do not have trouble pooping (constipation): °? Drink enough fluid to keep your pee (urine) pale yellow. °? Eat foods that have a lot of fiber. These include fresh fruits and vegetables, whole grains, and beans. °? Limit foods that are high in fat and processed sugars. These include foods that are fried or sweet. °? Take medicine for trouble pooping. °· Avoid lifting heavy objects. °· Avoid standing for long amounts of time. °· Do not use any products that contain nicotine or tobacco. These include cigarettes and e-cigarettes. If you need help quitting, ask your doctor. °· Stay at a healthy weight. °General instructions °· You may try to push your hernia in by very gently pressing on it when you are lying down. Do not try to force the bulge back in if it will not push in easily. °· Watch your hernia for any changes in shape, size, or color. Tell your doctor if you see any changes. °· Take over-the-counter and prescription medicines only as told by your doctor. °· Keep all follow-up visits as told by your doctor. This is  important. °Contact a doctor if: °· You have a fever. °· You have new symptoms. °· Your symptoms get worse. °Get help right away if: °· The area where your leg meets your lower belly has: °? Pain that gets worse suddenly. °? A bulge that gets bigger suddenly, and it does not get smaller after that. °? A bulge that turns red or purple. °? A bulge that is painful when you touch it. °· You are a man, and your scrotum: °? Suddenly feels painful. °? Suddenly changes in size. °· You cannot push the hernia in by very gently pressing on it when you are lying down. Do not try to force the bulge back in if it will not push in easily. °· You feel sick to your stomach (nauseous), and that feeling does not go away. °· You throw up (vomit), and that keeps happening. °· You have a fast heartbeat. °· You cannot poop (have a bowel movement) or pass gas. °These symptoms may be an emergency. Do not wait to see if the symptoms will go away. Get medical help right away. Call your local emergency services (911 in the [place] °Summary °· An inguinal hernia is when fat or your intestines push through a weak spot in a muscle where your leg meets your lower belly (groin). This causes a rounded lump (bulge). °· If you do not have symptoms, you may not need treatment. If you have symptoms or a large hernia,   you may need surgery. °· Avoid lifting heavy objects. Also avoid standing for long amounts of time. °· Do not try to force the bulge back in if it will not push in easily. °This information is not intended to replace advice given to you by your health care provider. Make sure you discuss any questions you have with your health care provider. °Document Revised: 12/25/2017 Document Reviewed: 08/25/2017 °Elsevier Patient Education © 2020 Elsevier Inc. ° °

## 2020-03-21 ENCOUNTER — Ambulatory Visit: Payer: Medicare Other | Admitting: General Surgery

## 2020-03-21 ENCOUNTER — Telehealth: Payer: Self-pay | Admitting: Cardiovascular Disease

## 2020-03-21 ENCOUNTER — Telehealth: Payer: Self-pay | Admitting: *Deleted

## 2020-03-21 ENCOUNTER — Other Ambulatory Visit: Payer: Self-pay

## 2020-03-21 ENCOUNTER — Encounter: Payer: Self-pay | Admitting: General Surgery

## 2020-03-21 VITALS — BP 145/80 | HR 75 | Temp 97.9°F | Resp 14 | Ht 72.0 in | Wt 207.0 lb

## 2020-03-21 DIAGNOSIS — K4090 Unilateral inguinal hernia, without obstruction or gangrene, not specified as recurrent: Secondary | ICD-10-CM

## 2020-03-21 NOTE — Patient Instructions (Addendum)
May want to consider compression hose or socks for leg edema. Most pharmacies carry these.   We will send a clearance for to your Cardiologist to see if they will ok you to have surgery before you have been on your Plavix for on year.   If they clear you, you will need to be off of Aspirin and Plavix for one week prior to surgery.   Our surgery scheduler will be in contact with you to see about scheduling your surgery once we have clearance.    Inguinal Hernia, Adult An inguinal hernia is when fat or your intestines push through a weak spot in a muscle where your leg meets your lower belly (groin). This causes a rounded lump (bulge). This kind of hernia could also be:  In your scrotum, if you are male.  In folds of skin around your vagina, if you are male. There are three types of inguinal hernias. These include:  Hernias that can be pushed back into the belly (are reducible). This type rarely causes pain.  Hernias that cannot be pushed back into the belly (are incarcerated).  Hernias that cannot be pushed back into the belly and lose their blood supply (are strangulated). This type needs emergency surgery. If you do not have symptoms, you may not need treatment. If you have symptoms or a large hernia, you may need surgery. Follow these instructions at home: Lifestyle  Do these things if told by your doctor so you do not have trouble pooping (constipation): ? Drink enough fluid to keep your pee (urine) pale yellow. ? Eat foods that have a lot of fiber. These include fresh fruits and vegetables, whole grains, and beans. ? Limit foods that are high in fat and processed sugars. These include foods that are fried or sweet. ? Take medicine for trouble pooping.  Avoid lifting heavy objects.  Avoid standing for long amounts of time.  Do not use any products that contain nicotine or tobacco. These include cigarettes and e-cigarettes. If you need help quitting, ask your doctor.  Stay  at a healthy weight. General instructions  You may try to push your hernia in by very gently pressing on it when you are lying down. Do not try to force the bulge back in if it will not push in easily.  Watch your hernia for any changes in shape, size, or color. Tell your doctor if you see any changes.  Take over-the-counter and prescription medicines only as told by your doctor.  Keep all follow-up visits as told by your doctor. This is important. Contact a doctor if:  You have a fever.  You have new symptoms.  Your symptoms get worse. Get help right away if:  The area where your leg meets your lower belly has: ? Pain that gets worse suddenly. ? A bulge that gets bigger suddenly, and it does not get smaller after that. ? A bulge that turns red or purple. ? A bulge that is painful when you touch it.  You are a man, and your scrotum: ? Suddenly feels painful. ? Suddenly changes in size.  You cannot push the hernia in by very gently pressing on it when you are lying down. Do not try to force the bulge back in if it will not push in easily.  You feel sick to your stomach (nauseous), and that feeling does not go away.  You throw up (vomit), and that keeps happening.  You have a fast heartbeat.  You cannot poop (  have a bowel movement) or pass gas. These symptoms may be an emergency. Do not wait to see if the symptoms will go away. Get medical help right away. Call your local emergency services (911 in the [place] Summary  An inguinal hernia is when fat or your intestines push through a weak spot in a muscle where your leg meets your lower belly (groin). This causes a rounded lump (bulge).  If you do not have symptoms, you may not need treatment. If you have symptoms or a large hernia, you may need surgery.  Avoid lifting heavy objects. Also avoid standing for long amounts of time.  Do not try to force the bulge back in if it will not push in easily. This information is not  intended to replace advice given to you by your health care provider. Make sure you discuss any questions you have with your health care provider. Document Revised: 12/25/2017 Document Reviewed: 08/25/2017 Elsevier Patient Education  [place].

## 2020-03-21 NOTE — Progress Notes (Signed)
Patient ID: [name] [name], male   DOB: 19-Jan-1951, 69 y.o.   MRN: [id number]  Chief Complaint  Patient presents with  . New Patient (Initial Visit)    Inguinal Hernia    HPI [name] [name] is a 69 y.o. male.   He has been referred by his primary care provider, [name] Collet, PA-C, for further evaluation of a left inguinal hernia.  His primary complaint is actually leg pain, that he rates at about a 5 out of 10.  He has some associated numbness and tingling.  As part of his evaluation with Ms. Terrilee Croak, he was found to have a left inguinal hernia.  The bulge has been present for at least a couple of weeks.  It reduces when he is supine, and comes back out when he is standing.  The discomfort is most noticeable when he has been on his feet for some time.  He denies any nausea or vomiting.  No fevers or chills.  No constipation or difficulty with urination.  Of note, he had drug-eluting stents placed in July 2020 and is currently on dual antiplatelet therapy.   Past Medical History:  Diagnosis Date  . Acquired hypothyroidism   . Coronary artery disease    a. cath 06/2019 s/p  orbital atherectomy and 2 overlapped DES to the mid and proximal LAD; angiopasty to 2nd dig  . ED (erectile dysfunction)   . Melanoma ([place])   . Paroxysmal atrial fibrillation (HCC)    Diagnosed in 2004  . Paroxysmal SVT (supraventricular tachycardia) ([place])   . Pure hypercholesterolemia     Past Surgical History:  Procedure Laterality Date  . CORONARY ATHERECTOMY N/A 07/05/2019   Procedure: CORONARY ATHERECTOMY;  Surgeon: Wellington Hampshire, MD;  Location: [facility];  Service: Cardiovascular;  Laterality: N/A;  . CORONARY BALLOON ANGIOPLASTY N/A 07/05/2019   Procedure: CORONARY BALLOON ANGIOPLASTY;  Surgeon: Wellington Hampshire, MD;  Location: [facility];  Service: Cardiovascular;  Laterality: N/A;  . CORONARY STENT INTERVENTION N/A 07/05/2019   Procedure: CORONARY STENT INTERVENTION;  Surgeon: Wellington Hampshire, MD;  Location: [facility];  Service: Cardiovascular;  Laterality: N/A;  . LEFT HEART CATH AND CORONARY ANGIOGRAPHY Left 06/26/2019   Procedure: LEFT HEART CATH AND CORONARY ANGIOGRAPHY;  Surgeon: Nelva Bush, MD;  Location: [facility];  Service: Cardiovascular;  Laterality: Left;  Marland Kitchen MELANOMA EXCISION  02/17/1983  . NECK SURGERY      Family History  Problem Relation Age of Onset  . Atrial fibrillation Mother   . Stroke Father   . Heart disease Father     Social History Social History   Tobacco Use  . Smoking status: Never Smoker  . Smokeless tobacco: Never Used  Substance Use Topics  . Alcohol use: Yes    Comment: Occ.   . Drug use: No    Allergies  Allergen Reactions  . Hydrocodone-Acetaminophen Nausea And Vomiting and Other (See Comments)  . Vicodin [Hydrocodone-Acetaminophen] Nausea And Vomiting    Vomiting    Current Outpatient Medications  Medication Sig Dispense Refill  . aspirin EC 81 MG tablet Take 81 mg by mouth at bedtime.    . cetirizine (ZYRTEC) 10 MG tablet Take 10 mg by mouth at bedtime.     . clopidogrel (PLAVIX) 75 MG tablet Take 1 tablet (75 mg total) by mouth daily. 30 tablet 11  . DILT-XR 180 MG 24 hr capsule Take 1 capsule by mouth once daily 90 capsule 0  .  ezetimibe (ZETIA) 10 MG tablet Take 1 tablet (10 mg total) by mouth every evening. 90 tablet 3  . levothyroxine (SYNTHROID) 25 MCG tablet TAKE 1 TABLET BY MOUTH ONCE DAILY BEFORE  BREAKFAST 90 tablet 0  . nitroGLYCERIN (NITROSTAT) 0.4 MG SL tablet Place 1 tablet (0.4 mg total) under the tongue every 5 (five) minutes as needed for chest pain. 25 tablet 3  . pantoprazole (PROTONIX) 40 MG tablet Take 40 mg by mouth daily.    . rosuvastatin (CRESTOR) 40 MG tablet Take 1 tablet (40 mg total) by mouth daily. (Patient taking differently: Take 40 mg by mouth every evening. ) 90 tablet 3  . tadalafil (CIALIS) 20 MG tablet 1 tab as needed 1 hour prior to intercourse 30 tablet  0   No current facility-administered medications for this visit.    Review of Systems Review of Systems  All other systems reviewed and are negative.   Blood pressure (!) 145/80, pulse 75, temperature 97.9 F (36.6 C), resp. rate 14, height 6' (1.829 m), weight 207 lb (93.9 kg), SpO2 96 %.  Physical Exam Physical Exam Vitals reviewed. Exam conducted with a chaperone present.  Constitutional:      General: He is not in acute distress.    Appearance: Normal appearance.  HENT:     Head: Normocephalic and atraumatic.     Nose:     Comments: Covered with a mask secondary to COVID-19 precautions    Mouth/Throat:     Comments: Covered with a mask secondary to COVID-19 precautions Eyes:     General: No scleral icterus.       Right eye: No discharge.        Left eye: No discharge.  Neck:     Comments: No thyromegaly or dominant thyroid masses appreciated. Cardiovascular:     Rate and Rhythm: Normal rate and regular rhythm.     Pulses: Normal pulses.  Pulmonary:     Effort: Pulmonary effort is normal.     Breath sounds: Normal breath sounds.  Abdominal:     General: Abdomen is flat. Bowel sounds are normal.     Palpations: Abdomen is soft.     Hernia: A hernia is present. Hernia is present in the left inguinal area.  Genitourinary:    Penis: Normal.      Testes: Normal.       Comments: There is a reducible left inguinal hernia.  No hernia appreciated on the right. Musculoskeletal:     Cervical back: No rigidity.     Right lower leg: No edema.     Left lower leg: No edema.  Lymphadenopathy:     Cervical: No cervical adenopathy.  Skin:    General: Skin is warm and dry.  Neurological:     General: No focal deficit present.     Mental Status: He is alert and oriented to person, place, and time.  Psychiatric:        Mood and Affect: Mood normal.        Behavior: Behavior normal.        Thought Content: Thought content normal.     Data Reviewed I reviewed Ms.  Pollak's note from March 19, 2020, where she diagnosed inguinal hernia.  I also reviewed a number of cardiology notes, the most recent of which was from 24 January 2020.  Specifically states that [name] [name] is to remain on dual antiplatelet therapy for at least 1 year.  Assessment This is a 69 year old man who has  a mildly symptomatic left inguinal hernia.  He does feel like the discomfort is worsening.  I have offered him an open left inguinal repair.  He seemed somewhat surprised that I was performing the procedure open and asked questions about the laparoscopic approach.  I let him know that I do not perform this procedure but that if he wished, I would happily refer him to one of my partners who does.  He expressed that he was fine with the open approach.  I did caution him that we would need to obtain cardiac clearance prior to proceeding, as typically, we do not take patients off dual antiplatelet therapy for drug-eluting stents unless the operation is of a urgent or emergent nature.  Plan I have explained the procedure, risks, and aftercare of inguinal hernia repair to [name] [name].   Risks include but are not limited to bleeding, infection, wound problems, anesthesia, recurrence, bladder or intestine injury, urinary retention, testicular dysfunction, chronic pain, mesh problems.  He  seems to understand and agrees to proceed, pending cardiology clearance.  Questions were answered to his stated satisfaction.   I learned later, after our visit, that he contacted the office and stated that he preferred the laparoscopic approach.  We will work on getting him scheduled with one of my partners.  Subsequently, we also received communication from his cardiologist that he would not be cleared to come off of his dual antiplatelet therapy until after July.   [name] [name] 03/21/2020, 3:55 PM

## 2020-03-21 NOTE — Telephone Encounter (Signed)
   [facility] Pre-operative Risk Assessment    Request for surgical clearance:  1. What type of surgery is being performed? Open left inguinal hernia repair  2. When is this surgery scheduled? TBD - in 3 weeks    3. What type of clearance is required (medical clearance vs. Pharmacy clearance to hold med vs. Both)? both  4. Are there any medications that need to be held prior to surgery and how long? Needs to be off plavix and aspirin 1 week prior to surgery   5. Practice name and name of physician performing surgery? [facility], Dr Fredirick Maudlin  6. What is your office phone number [phone]   7.   What is your office fax number [phone]  8.   Anesthesia type (None, local, MAC, general) ? general   Ace Gins 03/21/2020, 4:15 PM  _________________________________________________________________   (provider comments below)

## 2020-03-21 NOTE — Telephone Encounter (Signed)
Patient called and stated he wants to proceed with laparoscopic surgery instead of open.

## 2020-03-22 ENCOUNTER — Encounter: Payer: Self-pay | Admitting: Physician Assistant

## 2020-03-22 NOTE — Telephone Encounter (Signed)
Pre-op covering team, Mr. Hed had a stent placed to his proximal LAD less than one year ago on 07/05/2019. Ideally, he would be on uninterrupted dual antiplatelet therapy for a least 1 year. Can you reach out to the surgeon's office and ask about the urgency of this procedure? Can I wait until the beginning of August?  Thank you!

## 2020-03-22 NOTE — Telephone Encounter (Signed)
Hi Dr. Fletcher Anon,   Mr. [name] [name] has an open left inguinal hernia repair planned and is being asked to hold Aspirin and Plavix for 1 week prior surgery. However, patient had a complex orbital atherectomy with placement of 2 overlapping DES to the proximal and mid LAD as well as balloon angioplasty of the ostial 2nd diag less than 1 year ago on 07/05/2019. We notified surgeon's office that patient ideally needs to be on uninterrupted DAPT for a least 1 year prior. They stated surgery is not urgent but patient is having pain and discomfort. What would your recommendations be at this time in regards holding Aspirin and Plavix?  Please route response back to [facility] PREOP.  Thank you!

## 2020-03-22 NOTE — Telephone Encounter (Signed)
Given complex PCI and 2 overlapped stents extending to the ostial LAD, his dual antiplatelet therapy should not be interrupted for at least 1 year unless the surgery is urgent.  Even after 1 year, he will have to continue aspirin 81 mg daily without interruption.

## 2020-03-22 NOTE — Telephone Encounter (Signed)
I s/w Hoyle Sauer with [facility]/Dr. Vita Barley office. We discussed the urgency of pt's upcoming procedure. Per Hoyle Sauer procedure is not an urgent matter though the pt is having pain and discomfort. Hoyle Sauer, did state the pt is aware the surgery may need to be postponed due to stents placed last 07/05/19 and should have uninterrupted DAPT x 1 yr at least. I assured Hoyle Sauer that I will document our call today for the cardiologist to review. I did tell her sounds like they may need to postpone pt's surgery though I will get final notes from our pre op team and we will fax notes over with recommendations.

## 2020-03-25 ENCOUNTER — Telehealth: Payer: Self-pay | Admitting: General Surgery

## 2020-03-25 DIAGNOSIS — K4090 Unilateral inguinal hernia, without obstruction or gangrene, not specified as recurrent: Secondary | ICD-10-CM | POA: Insufficient documentation

## 2020-03-25 NOTE — Telephone Encounter (Signed)
To Dr. Arida to review.   

## 2020-03-25 NOTE — Telephone Encounter (Signed)
Patient calling to discuss changing to laparoscopic hernia repair .  Patient wants to know if this changes the clearance and if there is anyway to do this procedure sooner.  Patient co significant pain when ambulating and he is limiting movement because of this.

## 2020-03-25 NOTE — Telephone Encounter (Signed)
Received call from Mr. [name] [name], he was calling regarding his surgery.  As it turns out his cardiologist will not be able to grant cardiac clearance for his surgery at this time.  Patient is advised of this this and voices understanding.  Surgery has been cancelled.

## 2020-03-28 ENCOUNTER — Other Ambulatory Visit: Admission: RE | Admit: 2020-03-28 | Payer: Medicare Other | Source: Ambulatory Visit

## 2020-03-29 ENCOUNTER — Telehealth: Payer: Self-pay | Admitting: Cardiovascular Disease

## 2020-03-29 NOTE — Telephone Encounter (Signed)
2nd attempt. Unable to lmom. 

## 2020-03-29 NOTE — Telephone Encounter (Signed)
Its not an issue of laparoscopic or open surgery.  It is the issue of interruption of treatment with clopidogrel (Plavix).  If the hernia surgery is urgent given significant amount of pain, then Plavix can be held 5 days before the surgery but there is a slight increase in the risk of stent thrombosis.  Aspirin 81 mg once daily should not be stopped or interrupted.

## 2020-03-29 NOTE — Telephone Encounter (Signed)
Returned the patient call unable to lmom, patients voicemail is full.

## 2020-03-29 NOTE — Telephone Encounter (Addendum)
Can be discussed at the patient 04/01/20 appt with Laurann Montana, NP.

## 2020-03-29 NOTE — Telephone Encounter (Signed)
Patient c/o Palpitations:  High priority if patient c/o lightheadedness, shortness of breath, or chest pain  1) How long have you had palpitations/irregular HR/ Afib? Are you having the symptoms now? Worsening in the last week  2) Are you currently experiencing lightheadedness, SOB or CP? no  3) Do you have a history of afib (atrial fibrillation) or irregular heart rhythm? yes  4) Have you checked your BP or HR? (document readings if available): no  Are you experiencing any other symptoms? no

## 2020-04-01 ENCOUNTER — Other Ambulatory Visit: Payer: Self-pay

## 2020-04-01 ENCOUNTER — Encounter: Payer: Self-pay | Admitting: Family

## 2020-04-01 ENCOUNTER — Other Ambulatory Visit: Payer: Medicare Other

## 2020-04-01 ENCOUNTER — Telehealth: Payer: Self-pay | Admitting: General Surgery

## 2020-04-01 ENCOUNTER — Ambulatory Visit (INDEPENDENT_AMBULATORY_CARE_PROVIDER_SITE_OTHER): Payer: Medicare Other | Admitting: Family

## 2020-04-01 VITALS — BP 130/60 | HR 72 | Ht 72.0 in | Wt 208.0 lb

## 2020-04-01 DIAGNOSIS — R002 Palpitations: Secondary | ICD-10-CM

## 2020-04-01 DIAGNOSIS — I2510 Atherosclerotic heart disease of native coronary artery without angina pectoris: Secondary | ICD-10-CM

## 2020-04-01 DIAGNOSIS — I471 Supraventricular tachycardia, unspecified: Secondary | ICD-10-CM

## 2020-04-01 DIAGNOSIS — E785 Hyperlipidemia, unspecified: Secondary | ICD-10-CM

## 2020-04-01 DIAGNOSIS — Z01810 Encounter for preprocedural cardiovascular examination: Secondary | ICD-10-CM

## 2020-04-01 DIAGNOSIS — I480 Paroxysmal atrial fibrillation: Secondary | ICD-10-CM

## 2020-04-01 MED ORDER — DILTIAZEM HCL ER COATED BEADS 240 MG PO CP24: 240.0000 mg | ORAL_CAPSULE | Freq: Every day | ORAL | 3 refills | Status: DC

## 2020-04-01 NOTE — Telephone Encounter (Signed)
Pt called advising he has just returned from his cardiologist office where he was finally granted clearance for surgery.  At this point, however the pt is only interested in having sx done laparoscopically, as well as bilaterally, & is not sure if Dr. Celine Ahr will be willing to do it laparscopically. Please advise if she is & if not, who he can schedule a consultation with, if another appt will be needed or necessary. [name] [name]'s best contact # is [phone] to discuss further.  Thank you

## 2020-04-01 NOTE — Telephone Encounter (Signed)
Patients seen today for an appt. Closing this encounter.

## 2020-04-01 NOTE — Progress Notes (Signed)
Office Visit    Patient Name: [name] [name] Date of Encounter: 04/01/2020  Primary Care Provider:  Trinna Post, PA-C Primary Cardiologist:  Kathlyn Sacramento, MD Electrophysiologist:  None   Chief Complaint    [name] [name] is a 69 y.o. male with a hx of CAD s/p PCI/DES to LAD 06/2019, PAF diagnosed in 2004 not on [place] in setting of CHADS2VASC of 1, paroxysmal SVT, mild AI, diastolic dysfunction, hypothyroidism on Synthroid, severe possibly familial HLD, elevated liver function, strong family history of CAD on father's side. He presents today for surgical clearance and increased palpitations.    Past Medical History    Past Medical History:  Diagnosis Date  . Acquired hypothyroidism   . Coronary artery disease    a. cath 06/2019 s/p  orbital atherectomy and 2 overlapped DES to the mid and proximal LAD; angiopasty to 2nd dig  . ED (erectile dysfunction)   . Melanoma ([place])   . Paroxysmal atrial fibrillation (H[facility])    Diagnosed in 2004  . Paroxysmal SVT (supraventricular tachycardia) ([place])   . Pure hypercholesterolemia    Past Surgical History:  Procedure Laterality Date  . CORONARY ATHERECTOMY N/A 07/05/2019   Procedure: CORONARY ATHERECTOMY;  Surgeon: Wellington Hampshire, MD;  Location: [facility];  Service: Cardiovascular;  Laterality: N/A;  . CORONARY BALLOON ANGIOPLASTY N/A 07/05/2019   Procedure: CORONARY BALLOON ANGIOPLASTY;  Surgeon: Wellington Hampshire, MD;  Location: [facility];  Service: Cardiovascular;  Laterality: N/A;  . CORONARY STENT INTERVENTION N/A 07/05/2019   Procedure: CORONARY STENT INTERVENTION;  Surgeon: Wellington Hampshire, MD;  Location: [facility];  Service: Cardiovascular;  Laterality: N/A;  . LEFT HEART CATH AND CORONARY ANGIOGRAPHY Left 06/26/2019   Procedure: LEFT HEART CATH AND CORONARY ANGIOGRAPHY;  Surgeon: Nelva Bush, MD;  Location: [facility];  Service: Cardiovascular;  Laterality: Left;  Marland Kitchen MELANOMA EXCISION   02/17/1983  . NECK SURGERY      Allergies  Allergies  Allergen Reactions  . Hydrocodone-Acetaminophen Nausea And Vomiting and Other (See Comments)  . Vicodin [Hydrocodone-Acetaminophen] Nausea And Vomiting    Vomiting    History of Present Illness    [name] [name] is a 69 y.o. male with a hx of CAD s/p PCI/DES to LAD 06/2019, PAF diagnosed in 2004 not on [place] in setting of CHADS2VASC of 1, paroxysmal SVT, mild AI, diastolic dysfunction, hypothyroidism on Synthroid, severe possibly familial HLD, elevated liver function, strong family history of CAD on father's side. He was last seen 01/24/20.  Previously managed by Dr. Dion Saucier in [place], [place]. Diagnosed with atrial fibrillation in 2004 managed with metoprolol with was subsequently discontinued due to nightmares. Since managed on Diltiazem. Stress echo 2013 unremarkable. Holter 12/2014 predominantly SR, rare isolated PVCs, frequent isolated PACs, 6 atrial couplets, 3 runs of SVT with no atrial fib. Treadmill stress test 03/2018 with no evidence ischemia. Evaluated for increased palpitation 04/2019 which improved with reduction in caffeine intake. Zio 05/2019 with NSR, average HR 73 bpm with 27 episodes of short SVT with longest lasting 13 beats. Coronary CTA 06/2019 with subsequent recommendation for cardiac cath. Diagnostic LHC 06/26/19 with severe single-vessel CAD with 80-90% prox LAD stenosis with heavy calcification as well as mild-mod nonobstructive CAD involving mid LAD, mid LCx, mid RCA with elevated LVEDP and normal LV contraction consistent with diastolic dysfunction. He was recommended to undergo staged PCI with atherectomy. Underwent successful complex orbital atherectomy and overlapping DESx2 to prox/mid LAD extending into ostium  with balloon angioplasty of ostial D2. Procedure was complicated by non-flow limiting dissection in ostial diagonal with normal flow that did not require treatment.    At last clinic visit noted chest pain "here and  there" with activity lasting <1 day not requiring nitroglycerin. He had fewer episodes of palpitations, palpitations were noted to be triggered by caffeine. He had previously been recommended increased dose Diltiazem, but politely declined as he felt his palpitations were reasonably controlled.   He saw Dr. Celine Ahr for consideration of repair of left inguinal hernia. He subsequently was referred to one of her partners as he requested to have the procedure done laparoscopically. Preoperative clearance was requested, per Dr Fletcher Anon "If the hernia surgery is urgent given significant amount of pain, Plavix can be held 5 days before the surgery but there is slight increase in the risk of stent thrombosis. Aspirin 81mg  once daily should not be stopped or interrupted".   He also called the office 03/29/20 noting worsening palpitations over the last week but was unable to be reached by nursing.  Tells me his hernia work up started in October. He has bilateral inguinal hernias as well as an umbilical hernia. Tells me he can't walk much or his L inguinal hernia swells up a lot. The R side is painful as well and seems to be getting worse. Tells me it doesn't go down all the way when he sleeps as it used to. Tells me he has been trying to sit more so that his hernias do not swell near as much. But still considerably painful. He is concerned that he is not exercising or being nearly as active for his cardiac health. We discussed the risks and benefits of stopping dual antiplatelet therapy less than one year from coronary intervention. He verbalizes understanding of the small risk of in-stent restenosis.   He reports no chest pain, pressure, tightness. Reports no shortness of breath nor dyspnea on exertion.   Tells me at night he is having more palpitations. Feels like a "pounding". Not associated with shortness of breath nor chest pain. Occurs mostly at night or when he is resting. We discussed that his Diltiazem is an  extended release formulary and remains in the system 24 hours. He inquires about increasing the Diltiazem dose as previously recommended, agreed that this would be appropriate. We discussed triggers of palpitations such as stress and pain. Endorses being more stressed at work. He calls these episodes 'atrial fibrillation' - we discussed his recent ZIO monitor 06/2019 showing episodes of SVT which is likely what his symptoms are.   EKGs/Labs/Other Studies Reviewed:   The following studies were reviewed today:  2D Echo 04/2018: 1. The left ventricle has normal systolic function, with an ejection fraction of 55-60%. The cavity size was normal. There is mild concentric left ventricular hypertrophy. Left ventricular diastolic parameters were normal.  2. The right ventricle has normal systolic function. The cavity was normal. There is no increase in right ventricular wall thickness.  3. The mitral valve is grossly normal.  4. The tricuspid valve is grossly normal.  5. The aortic valve has an indeterminate number of cusps. Aortic valve regurgitation is mild by color flow Doppler. __________   CTA chest/abd/pelvis: 1. No dissection.  No PE.  No aortic aneurysm. 2. Atherosclerotic changes are noted throughout the thoracic and abdominal aorta. 3. No acute intra-abdominal abnormality. 4. Severe sigmoid diverticulosis without CT evidence of diverticulitis. 5. Enlarged prostate gland. Bilateral fat containing inguinal hernias are noted.  Zio 05/2019: Normal sinus rhythm with an average heart rate of 73 bpm. 27 episodes of short SVTs.  The longest lasted 13 beats. __________   Coronary CTA/FFR: 1. Left Main: 0.98. 2. LAD: Ostial: 0.98, proximal: 0.50. 3. LCX: Proximal: 0.95, mid: 078. 4. OM1: 0.77. 5. RCA: Proximal: 0.98, mid: 0.82, PDA: 0.79.   IMPRESSION: 1. CT FFR analysis showed severe stenosis in the proximal LAD, mid RCA, mid LCX artery. A cardiac catheterization is  recommended. __________   [facility] 06/2019: Conclusions: 1. Severe single-vessel CAD with 80-90% proximal LAD stenosis with heavy calcification. 2. Mild to moderate, non-obstructive CAD involving mid LAD, mid LCx, and mid RCA. 3. Elevated LVEDP with normal left ventricular contraction consistent with diastolic dysfunction.   Recommendations: 1. Plan for staged PCI to proximal LAD at [facility] using atherectomy.  Will start patient on dual antiplatelet therapy with aspirin and clopidogrel.  Long-term, transitioning Mr. Floor to NOAC + clopidogrel will need to be considered with his history of paroxysmal atrial fibrillation and CHADSVASC score of at least 2 (age + CAD). 2. Aggressive secondary prevention. __________   PCI 06/2019:  Mid LAD lesion is 55% stenosed.  Prox Cx to Mid Cx lesion is 40% stenosed.  Mid RCA lesion is 30% stenosed.  Prox LAD to Mid LAD lesion is 85% stenosed.  2nd Diag lesion is 50% stenosed.  Post intervention, there is a 0% residual stenosis.  A drug-eluting stent was successfully placed using a STENT RESOLUTE [id number].  Post intervention, there is a 0% residual stenosis.  A drug-eluting stent was successfully placed using a STENT RESOLUTE ONYX 3.0X8.  Post intervention, there is a 30% residual stenosis.  Balloon angioplasty was performed using a BALLOON SAPPHIRE 2.5X20.   Successful complex orbital atherectomy and 2 overlapped drug-eluting stent placement to the mid and proximal LAD extending into the ostium with balloon angioplasty of the ostial second diagonal.  There was non-flow-limiting dissection in ostial diagonal with normal flow.  This did not require treatment.   Recommendations: Dual antiplatelet therapy for at least 1 year.  Aggressive treatment of risk factors.  EKG:  EKG is ordered today.  The ekg ordered today demonstrates NSR 71 bpm with no acute ST/T wave changes  Recent Labs: 05/04/2019: Magnesium 2.2; TSH 2.987 08/17/2019: ALT 56;  BUN 18; Creatinine, Ser 0.91; Hemoglobin 14.5; Platelets 166; Potassium 4.2; Sodium 141  Recent Lipid Panel    Component Value Date/Time   CHOL 159 08/17/2019 0950   TRIG 105 08/17/2019 0950   HDL 51 08/17/2019 0950   CHOLHDL 3.1 08/17/2019 0950   CHOLHDL 3.2 05/11/2018 0845   VLDL 26 05/11/2018 0845   LDLCALC 89 08/17/2019 0950    Home Medications   Current Meds  Medication Sig  . aspirin EC 81 MG tablet Take 81 mg by mouth at bedtime.  . cetirizine (ZYRTEC) 10 MG tablet Take 10 mg by mouth at bedtime.   . clopidogrel (PLAVIX) 75 MG tablet Take 1 tablet (75 mg total) by mouth daily.  Marland Kitchen DILT-XR 180 MG 24 hr capsule Take 1 capsule by mouth once daily  . ezetimibe (ZETIA) 10 MG tablet Take 1 tablet (10 mg total) by mouth every evening.  Marland Kitchen levothyroxine (SYNTHROID) 25 MCG tablet TAKE 1 TABLET BY MOUTH ONCE DAILY BEFORE  BREAKFAST  . nitroGLYCERIN (NITROSTAT) 0.4 MG SL tablet Place 1 tablet (0.4 mg total) under the tongue every 5 (five) minutes as needed for chest pain.  . pantoprazole (PROTONIX) 40 MG tablet Take 40  mg by mouth daily.  . rosuvastatin (CRESTOR) 40 MG tablet Take 1 tablet (40 mg total) by mouth daily. (Patient taking differently: Take 40 mg by mouth every evening. )  . tadalafil (CIALIS) 20 MG tablet 1 tab as needed 1 hour prior to intercourse    Review of Systems      Review of Systems  Constitution: Negative for chills, fever and malaise/fatigue.  Cardiovascular: Positive for palpitations. Negative for chest pain, dyspnea on exertion, irregular heartbeat, leg swelling, near-syncope, orthopnea and syncope.  Respiratory: Negative for cough, shortness of breath and wheezing.   Musculoskeletal:       (+) bilateral inguinal hernia with pain  Gastrointestinal: Negative for melena, nausea and vomiting.  Genitourinary: Negative for hematuria.  Neurological: Negative for dizziness, light-headedness and weakness.   All other systems reviewed and are otherwise negative  except as noted above.  Physical Exam    VS:  BP 130/60 (BP Location: Left Arm, Patient Position: Sitting, Cuff Size: Normal)   Pulse 72   Ht 6' (1.829 m)   Wt 208 lb (94.3 kg)   SpO2 98%   BMI 28.21 kg/m  , BMI Body mass index is 28.21 kg/m. GEN: Well nourished, well developed, in no acute distress. HEENT: normal. Neck: Supple, no JVD, carotid bruits, or masses. Cardiac: RRR, no murmurs, rubs, or gallops. No clubbing, cyanosis, edema.  Radials/PT 2+ and equal bilaterally.  Respiratory:  Respirations regular and unlabored, clear to auscultation bilaterally. GI: Soft, nontender, nondistended, BS + x 4. MS: No deformity or atrophy. Skin: Warm and dry, no rash. Neuro:  Strength and sensation are intact. Psych: Normal affect.  Accessory Clinical Findings    ECG personally reviewed by me today - NSR 71 bpm - no acute changes.  Assessment & Plan    1. Preoperative cardiovascular exam - Plans to have laparoscopic repair of inguinal hernia.  He is hopeful to have bilateral hernias repaired. Previously evaluated by Dr. Celine Ahr, but will be referred to one of her partners for laparoscopic repair over open repair.   Tells me hernia symptoms have been ongoing and progressively worsening since October. Lengthy discussion regarding the risks and benefits of interrupting DAPT less than one year from coronary intervention. (Coronary intervention with DESx2 06/26/19). Per Dr. Fletcher Anon if hernia surgery is urgent given significant amount of pain, Plavix may be held. Given his level of pain, Mr. Donnetta Hail does not feel he can wait until after 06/25/20 to undergo repair. He verbalizes understanding of the risk of in-stent restenosis  but tells me his pain and symptoms are no longer tolerable. As such, with shared and educated decision making we agreed he may stop DAPT in order to have hernia repair completed.   EKG today SR 71 bpm with no acute ST/T wave changes. He has an exercise tolerance of >4 METS. His is  without anginal symptoms. He is deemed acceptable cardiovascular risk for the planned procedure without further cardiovascular testing.   May stop Plavix 5 days prior to planned procedure. Surgeon will tell him when to resume postoperatively. Aspirin 81mg  once daily should not be stopped or interrupted.   2. CAD - s/p successful complex orbital atherectomy and overlapping DESx2 to prox and mid LAD as well as PTCA of ostial D2 on 07/05/2019. Stable with no anginal symptoms. No indication for ischemic evaluation. GDMT includes DAPT (Aspirin/Plavix), CCB, statin. No beta blocker secondary to resting bradycardia and previous intolerance.   3. PAF - Isolated episode back in 2004 with no  evidence of recurrence. Low suspicion that his present palpitations are recurrent PAF as he had ZIO monitor 06/2019 with no evidence of PAF. No indication for anticoagulation due to need for DAPT, as above.   4. Paroxysmal SVT - Noted on ZIO 06/2019. Anticipate this is the etiology of his recurrent palpitations. We discussed that stress and pain could be triggers. Discussed reducing afternoon caffeine intake - encouraged to switch his evening Coke to evening caffeine-free Coke. Increase Diltiazem to 240mg  daily. Will send 30 day supply and he will report response prior to refill, then likely refill at 90 day supply.   5. Aortic valve regurgitation - Asymptomatic. Noted on echo 04/2019. Euvolemic on exam. Continue with optimal BP control. Follow with periodic echo.   6. Diastolic dysfunction - Euvolemic and well compensated on exam. Reports mild intermittent LE edema which is likely venous insufficiency, recommend compression stockings and elevating lower extremities when sitting.  No indication for diuretic.   7. HLD, LDL goal <70 - Presently on Crestor 40mg  and Zetia 10mg  daily. LDL 08/2019 was 89 at which time Zetia was added. As he is not fasting today, he is agreeable to return to the [facility] within the next few weeks  for fasting lipid panel and CMET.   Disposition: Follow up in 3 month(s) with Dr. Fletcher Anon or APP   Loel Dubonnet, NP 04/01/2020, 11:47 AM

## 2020-04-01 NOTE — Patient Instructions (Addendum)
Medication Instructions:  Your physician has recommended you make the following change in your medication:   CHANGE Diltiazem to 240mg - take 1 capsule by mouth once daily   *If you need a refill on your cardiac medications before your next appointment, please call your pharmacy*  Lab Work: - Your physician recommends that you return for FASTING lab work at your convenience: [place]  - come to the [place] entrance at [facility], 1st desk on the right to check in - Lab hours: Monday- Friday (7:30 am- 5:30 pm)  - Do not eat/ drink anything for 8 hours prior to your lab draw except for water/ black coffee  Testing/Procedures: Your EKG shows normal sinus rhythm which is a good result.   Follow-Up: At [facility], you and your health needs are our priority.  As part of our continuing mission to provide you with exceptional heart care, we have created designated Provider Care Teams.  These Care Teams include your primary Cardiologist (physician) and Advanced Practice Providers (APPs -  Physician Assistants and Nurse Practitioners) who all work together to provide you with the care you need, when you need it.  We recommend signing up for the patient portal called "MyChart".  Sign up information is provided on this After Visit Summary.  MyChart is used to connect with patients for Virtual Visits (Telemedicine).  Patients are able to view lab/test results, encounter notes, upcoming appointments, etc.  Non-urgent messages can be sent to your provider as well.   To learn more about what you can do with MyChart, go to [email or web address].    Your next appointment:   In late July   The format for your next appointment:   In Person  Provider:   You may see Kathlyn Sacramento, MD or one of the following Advanced Practice Providers on your designated Care Team:    Murray Hodgkins, NP  Christell Faith, PA-C  Marrianne Mood, PA-C  Other Instructions   Will send note to surgeon that you may  have procedure to repair your hernia if you wish. Be sure to ask them when to resume Plavix after the procedure.  You should remain on Aspirin 81mg  throughout the whole peri-procedural period.

## 2020-04-01 NOTE — Telephone Encounter (Signed)
Outreach made to [name] [name] who is aware Dr. Celine Ahr is referring him to her colleague, Dr. Dahlia Byes.  The [name] [name] has been scheduled to come on on 04/03/20 for evaluation & discuss surgical options.  Thank you

## 2020-04-03 ENCOUNTER — Encounter: Payer: Self-pay | Admitting: Surgery

## 2020-04-03 ENCOUNTER — Other Ambulatory Visit: Payer: Self-pay

## 2020-04-03 ENCOUNTER — Other Ambulatory Visit: Payer: Self-pay | Admitting: Physician Assistant

## 2020-04-03 ENCOUNTER — Ambulatory Visit: Admit: 2020-04-03 | Payer: Medicare Other | Admitting: General Surgery

## 2020-04-03 ENCOUNTER — Telehealth: Payer: Self-pay | Admitting: Emergency Medicine

## 2020-04-03 ENCOUNTER — Ambulatory Visit: Payer: Medicare Other | Admitting: Surgery

## 2020-04-03 ENCOUNTER — Telehealth: Payer: Self-pay | Admitting: Surgery

## 2020-04-03 VITALS — BP 169/80 | HR 83 | Temp 97.9°F | Resp 12 | Wt 207.8 lb

## 2020-04-03 DIAGNOSIS — K4020 Bilateral inguinal hernia, without obstruction or gangrene, not specified as recurrent: Secondary | ICD-10-CM | POA: Diagnosis not present

## 2020-04-03 DIAGNOSIS — E039 Hypothyroidism, unspecified: Secondary | ICD-10-CM

## 2020-04-03 SURGERY — REPAIR, HERNIA, INGUINAL, ADULT
Anesthesia: General | Laterality: Left

## 2020-04-03 NOTE — Telephone Encounter (Signed)
Outgoing call made, mail box full, could not leave a message. Please advise patient of Pre-Admission date/time, COVID Testing date and Surgery date.  Surgery Date: 04/18/20 Preadmission Testing Date: 04/10/20 (phone 8a-1p) Covid Testing Date: 04/16/20 - patient advised to go to the [facility] ([place]) between 8a-1p   Also patient to call [phone], between 1-3:00pm the day before surgery, to find out what time to arrive for surgery.

## 2020-04-03 NOTE — H&P (View-Only) (Signed)
Outpatient Surgical Follow Up  04/03/2020  [name] [name] is an 69 y.o. male.   Chief Complaint  Patient presents with  . Follow-up    bilateral inguinal hernia repair    HPI:  [name] [name] is a 69 year old male with a known history of bilateral inguinal hernias have been symptomatic for the last couple months or so.  He described that he experiences some intermittent pain on bilateral groins but is more pronounced on the left side.  The pain is intermittent, sharp and moderate in intensity.  It seems to be aggravated when he walks or a stand on his feet for prolonged period of time. He works in a [employer] and is very active.  Last year while he was having some dyspnea on exertion and some mild anginal episode cardiology evaluated him and a CTA of the coronaries was performed as well as a cardiac cath.  He did have two drug-eluting stents laced and is currently on dual antiplatelet therapy (Plavix and aspirin) He denies any previous abdominal operations or inguinal surgeries.  He does have a history of neck surgery cervical level and prior history of excision of melanoma.  He does have history of A. fib.  Currently no evidence of anginal type of symptoms and no evidence of acute coronary syndrome.  Dr. Fletcher Anon and his team has seen him and they are okay with holding Plavix for 5 days  Past Medical History:  Diagnosis Date  . Acquired hypothyroidism   . Coronary artery disease    a. cath 06/2019 s/p  orbital atherectomy and 2 overlapped DES to the mid and proximal LAD; angiopasty to 2nd dig  . ED (erectile dysfunction)   . Melanoma ([place])   . Paroxysmal atrial fibrillation (HCC)    Diagnosed in 2004  . Paroxysmal SVT (supraventricular tachycardia) ([place])   . Pure hypercholesterolemia     Past Surgical History:  Procedure Laterality Date  . CORONARY ATHERECTOMY N/A 07/05/2019   Procedure: CORONARY ATHERECTOMY;  Surgeon: Wellington Hampshire, MD;  Location: [facility];  Service:  Cardiovascular;  Laterality: N/A;  . CORONARY BALLOON ANGIOPLASTY N/A 07/05/2019   Procedure: CORONARY BALLOON ANGIOPLASTY;  Surgeon: Wellington Hampshire, MD;  Location: [place] [facility];  Service: Cardiovascular;  Laterality: N/A;  . CORONARY STENT INTERVENTION N/A 07/05/2019   Procedure: CORONARY STENT INTERVENTION;  Surgeon: Wellington Hampshire, MD;  Location: [place];  Service: Cardiovascular;  Laterality: N/A;  . LEFT HEART CATH AND CORONARY ANGIOGRAPHY Left 06/26/2019   Procedure: LEFT HEART CATH AND CORONARY ANGIOGRAPHY;  Surgeon: Nelva Bush, MD;  Location: [facility];  Service: Cardiovascular;  Laterality: Left;  Marland Kitchen MELANOMA EXCISION  02/17/1983  . NECK SURGERY      Family History  Problem Relation Age of Onset  . Atrial fibrillation Mother   . Stroke Father   . Heart disease Father     Social History:  reports that he has never smoked. He has never used smokeless tobacco. He reports current alcohol use. He reports that he does not use drugs.  Allergies:  Allergies  Allergen Reactions  . Hydrocodone-Acetaminophen Nausea And Vomiting and Other (See Comments)  . Vicodin [Hydrocodone-Acetaminophen] Nausea And Vomiting    Vomiting    Medications reviewed.    ROS Full ROS performed and is otherwise negative other than what is stated in HPI   BP (!) 169/80   Pulse 83   Temp 97.9 F (36.6 C)   Resp 12  Wt 207 lb 12.8 oz (94.3 kg)   SpO2 97%   BMI 28.18 kg/m   Physical Exam Vitals and nursing note reviewed. Exam conducted with a chaperone present.  Constitutional:      General: He is not in acute distress.    Appearance: Normal appearance. He is normal weight.  Eyes:     General:        Right eye: No discharge.        Left eye: No discharge.     Extraocular Movements: Extraocular movements intact.  Neck:     Vascular: No carotid bruit.  Cardiovascular:     Rate and Rhythm: Normal rate and regular rhythm.  Pulmonary:     Effort:  Pulmonary effort is normal. No respiratory distress.     Breath sounds: Normal breath sounds. No stridor. No wheezing or rhonchi.  Abdominal:     General: Bowel sounds are normal. There is no distension.     Palpations: Abdomen is soft.     Tenderness: There is no rebound.     Hernia: A hernia is present.     Comments: Evidence of reducible umbilical hernia and evidence of reducible bilateral inguinal hernia all 3 of them are mildly tender to palpation without peritonitis.  Musculoskeletal:        General: No swelling or tenderness. Normal range of motion.     Cervical back: Normal range of motion and neck supple. No rigidity or tenderness.  Skin:    General: Skin is warm and dry.     Capillary Refill: Capillary refill takes less than 2 seconds.  Neurological:     General: No focal deficit present.     Mental Status: He is alert and oriented to person, place, and time.  Psychiatric:        Mood and Affect: Mood normal.        Behavior: Behavior normal.        Thought Content: Thought content normal.        Judgment: Judgment normal.      Assessment/Plan: 69 year old male with symptomatic bilateral inguinal hernias and umbilical hernia in need for repair.  He does have a significant history of coronary artery disease with a recent stent placed less than a year ago. Dr. Fletcher Anon and his team have reviewed the case and they are okay with holding the Plavix given his significant symptoms.  I am okay with proceeding with the procedure on aspirin only.   Currently there is no evidence of anginal episodes or acute coronary syndrome.  I do think that he is an ideal candidate for a robotic approach.  We will be addressing the 3 hernias during the same operative setting.  Procedure discussed with the patient in detail.  Risks benefits and possible complications including but not limited to: Bleeding, infection, bowel injuries, recurrence, chronic pain.  He understands and wishes to  proceed.   Greater than 50% of the 40 minutes  visit was spent in counseling/coordination of care   Caroleen Hamman, MD [place] [facility]

## 2020-04-03 NOTE — Telephone Encounter (Signed)
Requested medication (s) are due for refill today Yes  Requested medication (s) are on the active medication list Yes  Future visit scheduled Yes  Note to clinic: Patient failed to have TSH when ordered 01/31/20. Routing to clinic for further consideration.   Requested Prescriptions  Pending Prescriptions Disp Refills   levothyroxine (SYNTHROID) 25 MCG tablet [Pharmacy Med Name: Levothyroxine Sodium 25 MCG Oral Tablet] 90 tablet 0    Sig: TAKE 1 TABLET BY MOUTH ONCE DAILY BEFORE  BREAKFAST      Endocrinology:  Hypothyroid Agents Failed - 04/03/2020  8:58 AM      Failed - TSH needs to be rechecked within 3 months after an abnormal result. Refill until TSH is due.      Passed - TSH in normal range and within 360 days    TSH  Date Value Ref Range Status  05/04/2019 2.987 0.350 - 4.500 uIU/mL Final    Comment:    Performed by a 3rd Generation assay with a functional sensitivity of <=0.01 uIU/mL. Performed at [facility], [address], [place], [place] [address]           Passed - Valid encounter within last 12 months    Recent Outpatient Visits           2 weeks ago Left inguinal hernia   [place], [place], [place]   2 months ago Gastroesophageal reflux disease, unspecified whether esophagitis present   [place], [place], PA-C   8 months ago Hyperlipidemia LDL goal <70   [facility], [place], [place]       Future Appointments             In 1 week Stoioff, Ronda Fairly, MD [place]   In 3 months Mickle Plumb, Jacquelyn D, PA-C [place], [place]   In 3 months Trinna Post, [place], [place]

## 2020-04-03 NOTE — Telephone Encounter (Signed)
Pt advised to stop taking Plavix 5 days before surgery rather than 7 days.   Pt voiced understanding.

## 2020-04-03 NOTE — Patient Instructions (Addendum)
As discussed with Dr Dahlia Byes, Once you have a surgery date: Plavix can be held 5 days before the surgery and Aspirin 81mg  should not be stopped or interrupted. They will let you know when to resume the Plavix after surgery.  Our surgery scheduler will contact you to schedule your surgery. Please have the [facility] Sheet available when she calls you.   Please call the office if you have any questions or concerns.   Inguinal Hernia, Adult An inguinal hernia is when fat or your intestines push through a weak spot in a muscle where your leg meets your lower belly (groin). This causes a rounded lump (bulge). This kind of hernia could also be:  In your scrotum, if you are male.  In folds of skin around your vagina, if you are male. There are three types of inguinal hernias. These include:  Hernias that can be pushed back into the belly (are reducible). This type rarely causes pain.  Hernias that cannot be pushed back into the belly (are incarcerated).  Hernias that cannot be pushed back into the belly and lose their blood supply (are strangulated). This type needs emergency surgery. If you do not have symptoms, you may not need treatment. If you have symptoms or a large hernia, you may need surgery. Follow these instructions at home: Lifestyle  Do these things if told by your doctor so you do not have trouble pooping (constipation): ? Drink enough fluid to keep your pee (urine) pale yellow. ? Eat foods that have a lot of fiber. These include fresh fruits and vegetables, whole grains, and beans. ? Limit foods that are high in fat and processed sugars. These include foods that are fried or sweet. ? Take medicine for trouble pooping.  Avoid lifting heavy objects.  Avoid standing for long amounts of time.  Do not use any products that contain nicotine or tobacco. These include cigarettes and e-cigarettes. If you need help quitting, ask your doctor.  Stay at a healthy weight. General  instructions  You may try to push your hernia in by very gently pressing on it when you are lying down. Do not try to force the bulge back in if it will not push in easily.  Watch your hernia for any changes in shape, size, or color. Tell your doctor if you see any changes.  Take over-the-counter and prescription medicines only as told by your doctor.  Keep all follow-up visits as told by your doctor. This is important. Contact a doctor if:  You have a fever.  You have new symptoms.  Your symptoms get worse. Get help right away if:  The area where your leg meets your lower belly has: ? Pain that gets worse suddenly. ? A bulge that gets bigger suddenly, and it does not get smaller after that. ? A bulge that turns red or purple. ? A bulge that is painful when you touch it.  You are a man, and your scrotum: ? Suddenly feels painful. ? Suddenly changes in size.  You cannot push the hernia in by very gently pressing on it when you are lying down. Do not try to force the bulge back in if it will not push in easily.  You feel sick to your stomach (nauseous), and that feeling does not go away.  You throw up (vomit), and that keeps happening.  You have a fast heartbeat.  You cannot poop (have a bowel movement) or pass gas. These symptoms may be an emergency. Do not  wait to see if the symptoms will go away. Get medical help right away. Call your local emergency services (911 in the [place] Summary  An inguinal hernia is when fat or your intestines push through a weak spot in a muscle where your leg meets your lower belly (groin). This causes a rounded lump (bulge).  If you do not have symptoms, you may not need treatment. If you have symptoms or a large hernia, you may need surgery.  Avoid lifting heavy objects. Also avoid standing for long amounts of time.  Do not try to force the bulge back in if it will not push in easily. This information is not intended to replace advice  given to you by your health care provider. Make sure you discuss any questions you have with your health care provider. Document Revised: 12/25/2017 Document Reviewed: 08/25/2017 Elsevier Patient Education  [place].

## 2020-04-03 NOTE — Progress Notes (Signed)
Outpatient Surgical Follow Up  04/03/2020  [name] [name] is an 69 y.o. male.   Chief Complaint  Patient presents with  . Follow-up    bilateral inguinal hernia repair    HPI:  [name] [name] is a 69 year old male with a known history of bilateral inguinal hernias have been symptomatic for the last couple months or so.  He described that he experiences some intermittent pain on bilateral groins but is more pronounced on the left side.  The pain is intermittent, sharp and moderate in intensity.  It seems to be aggravated when he walks or a stand on his feet for prolonged period of time. He works in a [employer] and is very active.  Last year while he was having some dyspnea on exertion and some mild anginal episode cardiology evaluated him and a CTA of the coronaries was performed as well as a cardiac cath.  He did have two drug-eluting stents laced and is currently on dual antiplatelet therapy (Plavix and aspirin) He denies any previous abdominal operations or inguinal surgeries.  He does have a history of neck surgery cervical level and prior history of excision of melanoma.  He does have history of A. fib.  Currently no evidence of anginal type of symptoms and no evidence of acute coronary syndrome.  Dr. Fletcher Anon and his team has seen him and they are okay with holding Plavix for 5 days  Past Medical History:  Diagnosis Date  . Acquired hypothyroidism   . Coronary artery disease    a. cath 06/2019 s/p  orbital atherectomy and 2 overlapped DES to the mid and proximal LAD; angiopasty to 2nd dig  . ED (erectile dysfunction)   . Melanoma ([place])   . Paroxysmal atrial fibrillation (HCC)    Diagnosed in 2004  . Paroxysmal SVT (supraventricular tachycardia) ([place])   . Pure hypercholesterolemia     Past Surgical History:  Procedure Laterality Date  . CORONARY ATHERECTOMY N/A 07/05/2019   Procedure: CORONARY ATHERECTOMY;  Surgeon: Wellington Hampshire, MD;  Location: [facility];  Service:  Cardiovascular;  Laterality: N/A;  . CORONARY BALLOON ANGIOPLASTY N/A 07/05/2019   Procedure: CORONARY BALLOON ANGIOPLASTY;  Surgeon: Wellington Hampshire, MD;  Location: [place] [facility];  Service: Cardiovascular;  Laterality: N/A;  . CORONARY STENT INTERVENTION N/A 07/05/2019   Procedure: CORONARY STENT INTERVENTION;  Surgeon: Wellington Hampshire, MD;  Location: [place] [facility];  Service: Cardiovascular;  Laterality: N/A;  . LEFT HEART CATH AND CORONARY ANGIOGRAPHY Left 06/26/2019   Procedure: LEFT HEART CATH AND CORONARY ANGIOGRAPHY;  Surgeon: Nelva Bush, MD;  Location: [place] [facility];  Service: Cardiovascular;  Laterality: Left;  Marland Kitchen MELANOMA EXCISION  02/17/1983  . NECK SURGERY      Family History  Problem Relation Age of Onset  . Atrial fibrillation Mother   . Stroke Father   . Heart disease Father     Social History:  reports that he has never smoked. He has never used smokeless tobacco. He reports current alcohol use. He reports that he does not use drugs.  Allergies:  Allergies  Allergen Reactions  . Hydrocodone-Acetaminophen Nausea And Vomiting and Other (See Comments)  . Vicodin [Hydrocodone-Acetaminophen] Nausea And Vomiting    Vomiting    Medications reviewed.    ROS Full ROS performed and is otherwise negative other than what is stated in HPI   BP (!) 169/80   Pulse 83   Temp 97.9 F (36.6 C)   Resp 12  Wt 207 lb 12.8 oz (94.3 kg)   SpO2 97%   BMI 28.18 kg/m   Physical Exam Vitals and nursing note reviewed. Exam conducted with a chaperone present.  Constitutional:      General: He is not in acute distress.    Appearance: Normal appearance. He is normal weight.  Eyes:     General:        Right eye: No discharge.        Left eye: No discharge.     Extraocular Movements: Extraocular movements intact.  Neck:     Vascular: No carotid bruit.  Cardiovascular:     Rate and Rhythm: Normal rate and regular rhythm.  Pulmonary:     Effort:  Pulmonary effort is normal. No respiratory distress.     Breath sounds: Normal breath sounds. No stridor. No wheezing or rhonchi.  Abdominal:     General: Bowel sounds are normal. There is no distension.     Palpations: Abdomen is soft.     Tenderness: There is no rebound.     Hernia: A hernia is present.     Comments: Evidence of reducible umbilical hernia and evidence of reducible bilateral inguinal hernia all 3 of them are mildly tender to palpation without peritonitis.  Musculoskeletal:        General: No swelling or tenderness. Normal range of motion.     Cervical back: Normal range of motion and neck supple. No rigidity or tenderness.  Skin:    General: Skin is warm and dry.     Capillary Refill: Capillary refill takes less than 2 seconds.  Neurological:     General: No focal deficit present.     Mental Status: He is alert and oriented to person, place, and time.  Psychiatric:        Mood and Affect: Mood normal.        Behavior: Behavior normal.        Thought Content: Thought content normal.        Judgment: Judgment normal.      Assessment/Plan: 69 year old male with symptomatic bilateral inguinal hernias and umbilical hernia in need for repair.  He does have a significant history of coronary artery disease with a recent stent placed less than a year ago. Dr. Fletcher Anon and his team have reviewed the case and they are okay with holding the Plavix given his significant symptoms.  I am okay with proceeding with the procedure on aspirin only.   Currently there is no evidence of anginal episodes or acute coronary syndrome.  I do think that he is an ideal candidate for a robotic approach.  We will be addressing the 3 hernias during the same operative setting.  Procedure discussed with the patient in detail.  Risks benefits and possible complications including but not limited to: Bleeding, infection, bowel injuries, recurrence, chronic pain.  He understands and wishes to  proceed.   Greater than 50% of the 40 minutes  visit was spent in counseling/coordination of care   Caroleen Hamman, MD [place] [facility]

## 2020-04-04 NOTE — Telephone Encounter (Signed)
Outgoing call is made again.  Spoke with patient he is now informed of his dates for surgery and voices understanding.

## 2020-04-10 ENCOUNTER — Encounter
Admission: RE | Admit: 2020-04-10 | Discharge: 2020-04-10 | Disposition: A | Discharge status: Home / Self Care | Payer: Medicare Other | Source: Ambulatory Visit | Attending: Surgery | Admitting: Surgery

## 2020-04-10 ENCOUNTER — Ambulatory Visit: Payer: Medicare Other | Admitting: Urology

## 2020-04-10 ENCOUNTER — Other Ambulatory Visit: Payer: Self-pay

## 2020-04-10 NOTE — Patient Instructions (Signed)
Your pre-surgery COVID test is scheduled: Tuesday 04/16/2020. Drive up in front of [facility] any time 8am-1pm and remain in your vehicle.  Your procedure is scheduled on: Thursday 04/18/2020 Report to Same Day Surgery [facility] elevator to 2nd floor.  Check in with surgery information desk.) To find out your arrival time, call [phone] 1:00-3:00 PM on Wednesday 04/17/2020  Remember: Instructions that are not followed completely may result in serious medical risk, up to and including death, or upon the discretion of your surgeon and anesthesiologist your surgery may need to be rescheduled.   __x__ 1. Do not eat food (including mints, candies, chewing gum) after midnight the night before your procedure. You may drink clear liquids up to 2 hours before you are scheduled to arrive at the hospital for your procedure.  Do not drink anything within 2 hours of your scheduled arrival to the hospital.  Approved clear liquids:  --Water or Apple juice without pulp  --Clear carbohydrate beverage such as Gatorade or Powerade  --Black Coffee or Clear Tea (No milk, no creamers, do not add anything to the coffee or tea)   __x__ 2. No Alcohol or smoking for 24 hours before or after surgery.  __x__ 3. Notify your doctor if there is any change in your medical condition (cold, fever, infections).  __x__ 4. On the morning of surgery brush your teeth with toothpaste and water.  You may rinse your mouth with mouthwash if you wish.  Do not swallow any toothpaste or mouthwash.  Please read over the following fact sheets attached: [facility] Preparing for Surgery  __x__ Use CHG Soap (provided) as directed on instruction sheet.   Do not wear jewelry, lotions, powders, deodorant or perfumes on the day of surgery.  Do not shave below the face/neck 48 hours prior to surgery.   Do not bring valuables to the hospital.    [facility] is not responsible for any belongings or valuables.             Contact lenses may not be worn into surgery.  For patients discharged on the day of surgery, you will NOT be permitted to drive yourself home.  You must have a responsible adult with you for 24 hours after surgery.  __x__ Take these medicines on the morning of surgery with a SMALL SIP OF WATER:  1. Diltiazem (Cardizem)  2. Levothyroxine (Synthroid)  3. Pantoprazole (Protonix)  __x__ Follow recommendations from Cardiologist, Pulmonologist or PCP regarding stopping blood thinners such as Aspirin, Plavix, Eliquis, Effient, Pradaxa, Coumadin, and Pletal.  Stop Plavix 5 days before surgery, continue low dose aspirin.  __x__ TODAY: Stop Anti-inflammatories such as Advil, Ibuprofen, Motrin, Aleve, Naproxen, Naprosyn, BC/Goodies powders. You may take Tylenol if needed.   __x__ TODAY: Do not take any over the counter supplements until after surgery. You may continue to take your Vitamin D.  RN reviewed instructions with patient via preop phone call.  Patient verbalized understanding, will receive printed copy on day of COVID test.

## 2020-04-16 ENCOUNTER — Other Ambulatory Visit
Admission: RE | Admit: 2020-04-16 | Discharge: 2020-04-16 | Disposition: A | Discharge status: Home / Self Care | Payer: Medicare Other | Source: Ambulatory Visit | Attending: Surgery | Admitting: Surgery

## 2020-04-16 DIAGNOSIS — Z20822 Contact with and (suspected) exposure to covid-19: Secondary | ICD-10-CM | POA: Diagnosis not present

## 2020-04-16 DIAGNOSIS — Z01812 Encounter for preprocedural laboratory examination: Secondary | ICD-10-CM | POA: Insufficient documentation

## 2020-04-16 LAB — BASIC METABOLIC PANEL
Anion gap: 9 (ref 5–15)
BUN: 21 mg/dL (ref 8–23)
CO2: 24 mmol/L (ref 22–32)
Calcium: 10.2 mg/dL (ref 8.9–10.3)
Chloride: 106 mmol/L (ref 98–111)
Creatinine, Ser: 0.74 mg/dL (ref 0.61–1.24)
GFR calc Af Amer: >60 mL/min (ref >60)
GFR calc non Af Amer: >60 mL/min (ref >60)
Glucose, Bld: 134 mg/dL — ABNORMAL HIGH (ref 70–99)
Potassium: 3.7 mmol/L (ref 3.5–5.1)
Sodium: 139 mmol/L (ref 135–145)

## 2020-04-16 LAB — CBC
HCT: 41.9 % (ref 39.0–52.0)
Hemoglobin: 14.5 g/dL (ref 13.0–17.0)
MCH: 29.7 pg (ref 26.0–34.0)
MCHC: 34.6 g/dL (ref 30.0–36.0)
MCV: 85.9 fL (ref 80.0–100.0)
Platelets: 165 10*3/uL (ref 150–400)
RBC: 4.88 MIL/uL (ref 4.22–5.81)
RDW: 13.3 % (ref 11.5–15.5)
WBC: 6.2 10*3/uL (ref 4.0–10.5)
nRBC: 0 % (ref 0.0–0.2)

## 2020-04-16 LAB — SARS CORONAVIRUS 2 (TAT 6-24 HRS): SARS Coronavirus 2: NEGATIVE

## 2020-04-17 ENCOUNTER — Encounter: Payer: Self-pay | Admitting: Surgery

## 2020-04-18 ENCOUNTER — Ambulatory Visit: Payer: Medicare Other | Admitting: Certified Registered Nurse Anesthetist

## 2020-04-18 ENCOUNTER — Other Ambulatory Visit: Payer: Self-pay

## 2020-04-18 ENCOUNTER — Ambulatory Visit
Admission: RE | Admit: 2020-04-18 | Discharge: 2020-04-18 | Disposition: A | Discharge status: Home / Self Care | Payer: Medicare Other | Attending: Surgery | Admitting: Surgery

## 2020-04-18 ENCOUNTER — Encounter
Admission: RE | Disposition: A | Discharge status: Home / Self Care | Payer: Self-pay | Source: Home / Self Care | Attending: Surgery

## 2020-04-18 ENCOUNTER — Encounter: Payer: Self-pay | Admitting: Surgery

## 2020-04-18 DIAGNOSIS — Z955 Presence of coronary angioplasty implant and graft: Secondary | ICD-10-CM | POA: Diagnosis not present

## 2020-04-18 DIAGNOSIS — K429 Umbilical hernia without obstruction or gangrene: Secondary | ICD-10-CM | POA: Insufficient documentation

## 2020-04-18 DIAGNOSIS — Z85820 Personal history of malignant melanoma of skin: Secondary | ICD-10-CM | POA: Insufficient documentation

## 2020-04-18 DIAGNOSIS — K4020 Bilateral inguinal hernia, without obstruction or gangrene, not specified as recurrent: Secondary | ICD-10-CM | POA: Insufficient documentation

## 2020-04-18 DIAGNOSIS — I2510 Atherosclerotic heart disease of native coronary artery without angina pectoris: Secondary | ICD-10-CM | POA: Diagnosis not present

## 2020-04-18 HISTORY — PX: XI ROBOTIC ASSISTED INGUINAL HERNIA REPAIR WITH MESH: SHX6706

## 2020-04-18 SURGERY — REPAIR, HERNIA, INGUINAL, ROBOT-ASSISTED, LAPAROSCOPIC, USING MESH
Anesthesia: General

## 2020-04-18 MED ORDER — ONDANSETRON HCL 4 MG/2ML IJ SOLN
INTRAMUSCULAR | Status: DC | PRN
  Administered 2020-04-18: 4 mg via INTRAVENOUS

## 2020-04-18 MED ORDER — LACTATED RINGERS IV SOLN: INTRAVENOUS | Status: DC

## 2020-04-18 MED ORDER — MIDAZOLAM HCL 2 MG/2ML IJ SOLN
INTRAMUSCULAR | Status: AC
  Filled 2020-04-18: qty 2

## 2020-04-18 MED ORDER — OXYCODONE-ACETAMINOPHEN 7.5-325 MG PO TABS: 1.0000 | ORAL_TABLET | ORAL | 0 refills | Status: DC | PRN

## 2020-04-18 MED ORDER — CHLORHEXIDINE GLUCONATE CLOTH 2 % EX PADS
6.0000 | MEDICATED_PAD | Freq: Once | CUTANEOUS | Status: AC
  Administered 2020-04-18: 6 via TOPICAL

## 2020-04-18 MED ORDER — FENTANYL CITRATE (PF) 100 MCG/2ML IJ SOLN
INTRAMUSCULAR | Status: AC
  Administered 2020-04-18: 25 ug via INTRAVENOUS
  Filled 2020-04-18: qty 2

## 2020-04-18 MED ORDER — OXYCODONE HCL 5 MG PO TABS
ORAL_TABLET | ORAL | Status: AC
  Filled 2020-04-18: qty 1

## 2020-04-18 MED ORDER — DEXAMETHASONE SODIUM PHOSPHATE 10 MG/ML IJ SOLN
INTRAMUSCULAR | Status: AC
  Filled 2020-04-18: qty 1

## 2020-04-18 MED ORDER — LIDOCAINE HCL (CARDIAC) PF 100 MG/5ML IV SOSY
PREFILLED_SYRINGE | INTRAVENOUS | Status: DC | PRN
  Administered 2020-04-18: 100 mg via INTRAVENOUS

## 2020-04-18 MED ORDER — ROCURONIUM BROMIDE 10 MG/ML (PF) SYRINGE
PREFILLED_SYRINGE | INTRAVENOUS | Status: AC
  Filled 2020-04-18: qty 10

## 2020-04-18 MED ORDER — CELECOXIB 200 MG PO CAPS
ORAL_CAPSULE | ORAL | Status: AC
  Administered 2020-04-18: 200 mg via ORAL
  Filled 2020-04-18: qty 1

## 2020-04-18 MED ORDER — BUPIVACAINE-EPINEPHRINE 0.25% -1:200000 IJ SOLN
INTRAMUSCULAR | Status: DC | PRN
  Administered 2020-04-18: 30 mL

## 2020-04-18 MED ORDER — ACETAMINOPHEN 500 MG PO TABS: 1000.0000 mg | ORAL_TABLET | ORAL | Status: AC

## 2020-04-18 MED ORDER — ASPIRIN 81 MG PO CHEW
81.0000 mg | CHEWABLE_TABLET | Freq: Once | ORAL | Status: AC
  Administered 2020-04-18: 81 mg via ORAL
  Filled 2020-04-18: qty 1

## 2020-04-18 MED ORDER — FENTANYL CITRATE (PF) 100 MCG/2ML IJ SOLN
INTRAMUSCULAR | Status: AC
  Filled 2020-04-18: qty 2

## 2020-04-18 MED ORDER — SUGAMMADEX SODIUM 500 MG/5ML IV SOLN
INTRAVENOUS | Status: AC
  Filled 2020-04-18: qty 5

## 2020-04-18 MED ORDER — PROPOFOL 10 MG/ML IV BOLUS
INTRAVENOUS | Status: DC | PRN
  Administered 2020-04-18: 150 mg via INTRAVENOUS

## 2020-04-18 MED ORDER — OXYCODONE HCL 5 MG/5ML PO SOLN: 5.0000 mg | Freq: Once | ORAL | Status: AC | PRN

## 2020-04-18 MED ORDER — CEFAZOLIN SODIUM-DEXTROSE 2-4 GM/100ML-% IV SOLN
INTRAVENOUS | Status: AC
  Filled 2020-04-18: qty 100

## 2020-04-18 MED ORDER — SUGAMMADEX SODIUM 200 MG/2ML IV SOLN
INTRAVENOUS | Status: DC | PRN
  Administered 2020-04-18: 200 mg via INTRAVENOUS

## 2020-04-18 MED ORDER — ACETAMINOPHEN 500 MG PO TABS
ORAL_TABLET | ORAL | Status: AC
  Administered 2020-04-18: 1000 mg via ORAL
  Filled 2020-04-18: qty 2

## 2020-04-18 MED ORDER — EPINEPHRINE PF 1 MG/ML IJ SOLN
INTRAMUSCULAR | Status: AC
  Filled 2020-04-18: qty 1

## 2020-04-18 MED ORDER — CEFAZOLIN SODIUM-DEXTROSE 2-4 GM/100ML-% IV SOLN
2.0000 g | INTRAVENOUS | Status: AC
  Administered 2020-04-18: 2 g via INTRAVENOUS

## 2020-04-18 MED ORDER — BUPIVACAINE LIPOSOME 1.3 % IJ SUSP
INTRAMUSCULAR | Status: AC
  Filled 2020-04-18: qty 20

## 2020-04-18 MED ORDER — GABAPENTIN 300 MG PO CAPS: 300.0000 mg | ORAL_CAPSULE | ORAL | Status: AC

## 2020-04-18 MED ORDER — DEXAMETHASONE SODIUM PHOSPHATE 10 MG/ML IJ SOLN: INTRAMUSCULAR | Status: DC | PRN

## 2020-04-18 MED ORDER — ONDANSETRON HCL 4 MG/2ML IJ SOLN
INTRAMUSCULAR | Status: AC
  Filled 2020-04-18: qty 2

## 2020-04-18 MED ORDER — LIDOCAINE HCL (PF) 2 % IJ SOLN
INTRAMUSCULAR | Status: AC
  Filled 2020-04-18: qty 5

## 2020-04-18 MED ORDER — ONDANSETRON HCL 4 MG/2ML IJ SOLN: 4.0000 mg | Freq: Once | INTRAMUSCULAR | Status: DC | PRN

## 2020-04-18 MED ORDER — FENTANYL CITRATE (PF) 100 MCG/2ML IJ SOLN
INTRAMUSCULAR | Status: DC | PRN
  Administered 2020-04-18: 50 ug via INTRAVENOUS
  Administered 2020-04-18 (×2): 25 ug via INTRAVENOUS
  Administered 2020-04-18 (×2): 50 ug via INTRAVENOUS

## 2020-04-18 MED ORDER — ASPIRIN 81 MG PO CHEW
CHEWABLE_TABLET | ORAL | Status: AC
  Filled 2020-04-18: qty 4

## 2020-04-18 MED ORDER — ROCURONIUM BROMIDE 100 MG/10ML IV SOLN
INTRAVENOUS | Status: DC | PRN
  Administered 2020-04-18: 20 mg via INTRAVENOUS
  Administered 2020-04-18: 50 mg via INTRAVENOUS

## 2020-04-18 MED ORDER — FENTANYL CITRATE (PF) 100 MCG/2ML IJ SOLN
25.0000 ug | INTRAMUSCULAR | Status: DC | PRN
  Administered 2020-04-18: 25 ug via INTRAVENOUS

## 2020-04-18 MED ORDER — PHENYLEPHRINE HCL (PRESSORS) 10 MG/ML IV SOLN
INTRAVENOUS | Status: DC | PRN
  Administered 2020-04-18: 100 ug via INTRAVENOUS

## 2020-04-18 MED ORDER — OXYCODONE HCL 5 MG PO TABS
5.0000 mg | ORAL_TABLET | Freq: Once | ORAL | Status: AC | PRN
  Administered 2020-04-18: 5 mg via ORAL

## 2020-04-18 MED ORDER — PROPOFOL 10 MG/ML IV BOLUS
INTRAVENOUS | Status: AC
  Filled 2020-04-18: qty 20

## 2020-04-18 MED ORDER — DEXAMETHASONE SODIUM PHOSPHATE 10 MG/ML IJ SOLN
INTRAMUSCULAR | Status: DC | PRN
  Administered 2020-04-18: 10 mg via INTRAVENOUS

## 2020-04-18 MED ORDER — BUPIVACAINE HCL (PF) 0.25 % IJ SOLN
INTRAMUSCULAR | Status: AC
  Filled 2020-04-18: qty 30

## 2020-04-18 MED ORDER — MIDAZOLAM HCL 2 MG/2ML IJ SOLN
INTRAMUSCULAR | Status: DC | PRN
  Administered 2020-04-18: 1 mg via INTRAVENOUS

## 2020-04-18 MED ORDER — CELECOXIB 200 MG PO CAPS: 200.0000 mg | ORAL_CAPSULE | ORAL | Status: AC

## 2020-04-18 MED ORDER — GABAPENTIN 300 MG PO CAPS
ORAL_CAPSULE | ORAL | Status: AC
  Administered 2020-04-18: 300 mg via ORAL
  Filled 2020-04-18: qty 1

## 2020-04-18 SURGICAL SUPPLY — 55 items
"PENCIL ELECTRO HAND CTR " (MISCELLANEOUS) ×2 IMPLANT
BLADE SURG SZ11 CARB STEEL (BLADE) ×3 IMPLANT
CANISTER SUCT 1200ML W/VALVE (MISCELLANEOUS) ×3 IMPLANT
CHLORAPREP W/TINT 26 (MISCELLANEOUS) ×3 IMPLANT
COVER TIP SHEARS 8 DVNC (MISCELLANEOUS) ×2 IMPLANT
COVER TIP SHEARS 8MM DA VINCI (MISCELLANEOUS) ×1
COVER WAND RF STERILE (DRAPES) ×3 IMPLANT
DEFOGGER SCOPE WARMER CLEARIFY (MISCELLANEOUS) ×3 IMPLANT
DERMABOND ADVANCED (GAUZE/BANDAGES/DRESSINGS) ×1
DERMABOND ADVANCED .7 DNX12 (GAUZE/BANDAGES/DRESSINGS) ×2 IMPLANT
DRAPE 3/4 80X56 (DRAPES) ×3 IMPLANT
DRAPE ARM DVNC X/XI (DISPOSABLE) ×8 IMPLANT
DRAPE COLUMN DVNC XI (DISPOSABLE) ×2 IMPLANT
DRAPE DA VINCI XI ARM (DISPOSABLE) ×4
DRAPE DA VINCI XI COLUMN (DISPOSABLE) ×1
ELECT CAUTERY BLADE 6.4 (BLADE) ×3 IMPLANT
ELECT REM PT RETURN 9FT ADLT (ELECTROSURGICAL) ×3
ELECTRODE REM PT RTRN 9FT ADLT (ELECTROSURGICAL) ×2 IMPLANT
GLOVE BIO SURGEON STRL SZ7 (GLOVE) ×12 IMPLANT
GOWN STRL REUS W/ TWL LRG LVL3 (GOWN DISPOSABLE) ×8 IMPLANT
GOWN STRL REUS W/TWL LRG LVL3 (GOWN DISPOSABLE) ×4
GRASPER SUT TROCAR 14GX15 (MISCELLANEOUS) ×3 IMPLANT
IRRIGATION STRYKERFLOW (MISCELLANEOUS) ×2 IMPLANT
IRRIGATOR STRYKERFLOW (MISCELLANEOUS) ×3
IV NS 1000ML (IV SOLUTION)
IV NS 1000ML BAXH (IV SOLUTION) IMPLANT
KIT PINK PAD W/HEAD ARE REST (MISCELLANEOUS) ×3
KIT PINK PAD W/HEAD ARM REST (MISCELLANEOUS) ×2 IMPLANT
LABEL OR SOLS (LABEL) ×3 IMPLANT
MESH 3DMAX 4X6 LT LRG (Mesh General) ×1 IMPLANT
MESH 3DMAX 4X6 RT LRG (Mesh General) ×1 IMPLANT
NEEDLE HYPO 22GX1.5 SAFETY (NEEDLE) ×3 IMPLANT
OBTURATOR OPTICAL STANDARD 8MM (TROCAR) ×1
OBTURATOR OPTICAL STND 8 DVNC (TROCAR) ×2
OBTURATOR OPTICALSTD 8 DVNC (TROCAR) ×2 IMPLANT
PACK LAP CHOLECYSTECTOMY (MISCELLANEOUS) ×3 IMPLANT
PENCIL ELECTRO HAND CTR (MISCELLANEOUS) ×3 IMPLANT
SEAL CANN UNIV 5-8 DVNC XI (MISCELLANEOUS) ×6 IMPLANT
SEAL XI 5MM-8MM UNIVERSAL (MISCELLANEOUS) ×3
SET TUBE SMOKE EVAC HIGH FLOW (TUBING) ×3 IMPLANT
SOLUTION ELECTROLUBE (MISCELLANEOUS) ×3 IMPLANT
SPONGE LAP 18X18 RF (DISPOSABLE) ×3 IMPLANT
SUT ETHIBOND 0 MO6 C/R (SUTURE) ×1 IMPLANT
SUT MNCRL 4-0 (SUTURE) ×1
SUT MNCRL 4-0 27XMFL (SUTURE) ×2
SUT MNCRL AB 4-0 PS2 18 (SUTURE) ×3 IMPLANT
SUT STRATAFIX PDS 30 CT-1 (SUTURE) ×3 IMPLANT
SUT VIC AB 2-0 SH 27 (SUTURE) ×2
SUT VIC AB 2-0 SH 27XBRD (SUTURE) ×4 IMPLANT
SUT VICRYL 0 AB UR-6 (SUTURE) ×6 IMPLANT
SUT VLOC 90 2/L VL 12 GS22 (SUTURE) ×6 IMPLANT
SUT VLOC 90 S/L VL9 GS22 (SUTURE) ×3 IMPLANT
SUTURE MNCRL 4-0 27XMF (SUTURE) ×2 IMPLANT
TROCAR 130MM GELPORT  DAV (MISCELLANEOUS) ×3 IMPLANT
TROCAR BALLN GELPORT 12X130M (ENDOMECHANICALS) ×3 IMPLANT

## 2020-04-18 NOTE — Anesthesia Preprocedure Evaluation (Addendum)
Anesthesia Evaluation  Patient identified by MRN, date of birth, ID band Patient awake    Reviewed: Allergy & Precautions, NPO status , Patient's Chart, lab work & pertinent test results  History of Anesthesia Complications Negative for: history of anesthetic complications  Airway Mallampati: II  TM Distance: >3 FB Neck ROM: Full    Dental no notable dental hx. (+) Teeth Intact, Dental Advisory Given   Pulmonary neg pulmonary ROS, neg sleep apnea, neg COPD, Patient abstained from smoking.Not current smoker,    Pulmonary exam normal breath sounds clear to auscultation       Cardiovascular Exercise Tolerance: Good METS(-) hypertension(-) angina+ CAD and + Cardiac Stents  (-) Past MI + dysrhythmias Atrial Fibrillation  Rhythm:Regular Rate:Normal - Systolic murmurs Per Dr Tyrell Antonio note: "If the hernia surgery is urgent given significant amount of pain, then Plavix can be held 5 days before the surgery but there is a slight increase in the risk of stent thrombosis.  Aspirin 81 mg once daily should not be stopped or interrupted."  TTE 2021: 1. The left ventricle has normal systolic function, with an ejection  fraction of 55-60%. The cavity size was normal. There is mild concentric  left ventricular hypertrophy. Left ventricular diastolic parameters were  normal.  2. The right ventricle has normal systolic function. The cavity was  normal. There is no increase in right ventricular wall thickness.  3. The mitral valve is grossly normal.  4. The tricuspid valve is grossly normal.  5. The aortic valve has an indeterminate number of cusps. Aortic valve  regurgitation is mild by color flow Doppler.   Cath report 06/2019:  Mid LAD lesion is 55% stenosed.  Prox Cx to Mid Cx lesion is 40% stenosed.  Mid RCA lesion is 30% stenosed.  Prox LAD to Mid LAD lesion is 85% stenosed.  2nd Diag lesion is 50% stenosed.  Post intervention,  there is a 0% residual stenosis.  A drug-eluting stent was successfully placed using a STENT RESOLUTE [id number].  Post intervention, there is a 0% residual stenosis.  A drug-eluting stent was successfully placed using a STENT RESOLUTE ONYX 3.0X8.  Post intervention, there is a 30% residual stenosis.  Balloon angioplasty was performed using a BALLOON SAPPHIRE 2.5X20.   Successful complex orbital atherectomy and 2 overlapped drug-eluting stent placement to the mid and proximal LAD extending into the ostium with balloon angioplasty of the ostial second diagonal.  There was non-flow-limiting dissection in ostial diagonal with normal flow.  This did not require treatment.  Recommendations: Dual antiplatelet therapy for at least 1 year.  Aggressive treatment of risk factors.   Neuro/Psych negative neurological ROS  negative psych ROS   GI/Hepatic neg GERD  ,(+)     (-) substance abuse  ,   Endo/Other  neg diabetesHypothyroidism   Renal/GU negative Renal ROS     Musculoskeletal   Abdominal   Peds  Hematology   Anesthesia Other Findings Past Medical History: No date: Acquired hypothyroidism No date: Coronary artery disease     Comment:  a. cath 06/2019 s/p  orbital atherectomy and 2 overlapped              DES to the mid and proximal LAD; angiopasty to 2nd dig No date: ED (erectile dysfunction) No date: Melanoma ([place]) No date: Paroxysmal atrial fibrillation ([place])     Comment:  Diagnosed in 2004 No date: Paroxysmal SVT (supraventricular tachycardia) ([place]) No date: Pure hypercholesterolemia  Reproductive/Obstetrics  Anesthesia Physical Anesthesia Plan  ASA: III  Anesthesia Plan: General   Post-op Pain Management:    Induction: Intravenous  PONV Risk Score and Plan: 4 or greater and Ondansetron, Dexamethasone and Midazolam  Airway Management Planned: Oral ETT  Additional Equipment: None  Intra-op Plan:    Post-operative Plan: Extubation in OR  Informed Consent: I have reviewed the patients History and Physical, chart, labs and discussed the procedure including the risks, benefits and alternatives for the proposed anesthesia with the patient or authorized representative who has indicated his/her understanding and acceptance.     Dental advisory given  Plan Discussed with: CRNA and Surgeon  Anesthesia Plan Comments: (Patient last took plavix 5 days ago, last took baby aspirin yesterday. Discussed risks of anesthesia with patient, including PONV, sore throat, lip/dental damage. Rare risks discussed as well, such as cardiorespiratory and neurological sequelae. Patient counseled that he is at higher risk for stent thrombosis. Patient understands.)        Anesthesia Quick Evaluation

## 2020-04-18 NOTE — Anesthesia Procedure Notes (Signed)
Procedure Name: Intubation Date/Time: 04/18/2020 1:35 PM Performed by: Johnna Acosta, CRNA Pre-anesthesia Checklist: Patient identified, Emergency Drugs available, Suction available, Patient being monitored and Timeout performed Patient Re-evaluated:Patient Re-evaluated prior to induction Oxygen Delivery Method: Circle system utilized Preoxygenation: Pre-oxygenation with 100% oxygen Induction Type: IV induction Ventilation: Mask ventilation without difficulty Laryngoscope Size: McGraph and 3 Grade View: Grade I Tube type: Oral Tube size: 7.5 mm Number of attempts: 1 Airway Equipment and Method: Stylet and Video-laryngoscopy Placement Confirmation: ETT inserted through vocal cords under direct vision,  positive ETCO2 and breath sounds checked- equal and bilateral Secured at: 22 cm Tube secured with: Tape Dental Injury: Teeth and Oropharynx as per pre-operative assessment

## 2020-04-18 NOTE — OR Nursing (Signed)
Per Dr. Dahlia Byes, secure chat, patient may resume plavix tomorrow.  Added to discharge instructions, med section.

## 2020-04-18 NOTE — Discharge Instructions (Addendum)
AMBULATORY SURGERY  DISCHARGE INSTRUCTIONS   1) The drugs that you were given will stay in your system until tomorrow so for the next 24 hours you should not:  A) Drive an automobile B) Make any legal decisions C) Drink any alcoholic beverage   2) You may resume regular meals tomorrow.  Today it is better to start with liquids and gradually work up to solid foods.  You may eat anything you prefer, but it is better to start with liquids, then soup and crackers, and gradually work up to solid foods.   3) Please notify your doctor immediately if you have any unusual bleeding, trouble breathing, redness and pain at the surgery site, drainage, fever, or pain not relieved by medication.    4) Additional Instructions:        Please contact your physician with any problems or Same Day Surgery at [phone], Monday through Friday 6 am to 4 pm, or [place] [facility] number at [phone].   Laparoscopic Inguinal Hernia Repair, Adult, Care After This sheet gives you information about how to care for yourself after your procedure. Your health care provider may also give you more specific instructions. If you have problems or questions, contact your health care provider. What can I expect after the procedure? After the procedure, it is common to have:  Pain.  Swelling and bruising around the incision area.  Scrotal swelling, in men.  Some fluid or blood draining from your incisions. Follow these instructions at home: Incision care  Follow instructions from your health care provider about how to take care of your incisions. Make sure you: ? Wash your hands with soap and water before you change your bandage (dressing). If soap and water are not available, use hand sanitizer. ? Change your dressing as told by your health care provider. ? Leave stitches (sutures), skin glue, or adhesive strips in place. These skin closures may need to stay in place for 2 weeks or longer.  If adhesive strip edges start to loosen and curl up, you may trim the loose edges. Do not remove adhesive strips completely unless your health care provider tells you to do that.  Check your incision area every day for signs of infection. Check for: ? More redness, swelling, or pain. ? More fluid or blood. ? Warmth. ? Pus or a bad smell.  Wear loose, soft clothing while your incisions heal. Driving  Do not drive or use heavy machinery while taking prescription pain medicine.  Do not drive for 24 hours if you were given a medicine to help you relax (sedative) during your procedure. Activity  Do not lift anything that is heavier than 10 lb (4.5 kg), or the limit that you are told, until your health care provider says that it is safe.  Ask your health care provider what activities are safe for you. A lot of activity during the first week after surgery can increase pain and swelling. For 1 week after your procedure: ? Avoid activities that take a lot of effort, such as exercise or sports. ? You may walk and climb stairs as needed for daily activity, but avoid long walks or climbing stairs for exercise. Managing pain and swelling   Put ice on painful or swollen areas: ? Put ice in a plastic bag. ? Place a towel between your skin and the bag. ? Leave the ice on for 20 minutes, 2-3 times a day. General instructions  Do not take baths, swim, or use   a hot tub until your health care provider approves. Ask your health care provider if you may take showers. You may only be allowed to take sponge baths.  Take over-the-counter and prescription medicines only as told by your health care provider.  To prevent or treat constipation while you are taking prescription pain medicine, your health care provider may recommend that you: ? Drink enough fluid to keep your urine pale yellow. ? Take over-the-counter or prescription medicines. ? Eat foods that are high in fiber, such as fresh fruits and  vegetables, whole grains, and beans. ? Limit foods that are high in fat and processed sugars, such as fried and sweet foods.  Do not use any products that contain nicotine or tobacco, such as cigarettes and e-cigarettes. If you need help quitting, ask your health care provider.  Drink enough fluid to keep your urine pale yellow.  Keep all follow-up visits as told by your health care provider. This is important. Contact a health care provider if:  You have more redness, swelling, or pain around your incisions or your groin area.  You have more swelling in your scrotum.  You have more fluid or blood coming from your incisions.  Your incisions feel warm to the touch.  You have severe pain and medicines do not help.  You have abdominal pain or swelling.  You cannot eat or drink without vomiting.  You cannot urinate or pass a bowel movement.  You faint.  You feel dizzy.  You have nausea and vomiting.  You have a fever. Get help right away if:  You have pus or a bad smell coming from your incisions.  You have redness, warmth, or pain in your leg.  You have chest pain.  You have problems breathing. Summary  Pain, swelling, and bruising are common after the procedure.  Check your incision area every day for signs of infection, such as more redness, swelling, or pain.  Put ice on painful or swollen areas for 20 minutes, 2-3 times a day. This information is not intended to replace advice given to you by your health care provider. Make sure you discuss any questions you have with your health care provider. Document Revised: 05/03/2019 Document Reviewed: 03/04/2017 Elsevier Patient Education  2020 [employer]  

## 2020-04-18 NOTE — Op Note (Signed)
Robotic assisted Laparoscopic Transabdominal bilateral Inguinal Hernia Repair with 3 D large Mesh Umbilical hernia repair       Pre-operative Diagnosis:  bilateral Inguinal and umbilical Hernias   Post-operative Diagnosis: Same   Procedure: Robotic  Laparoscopic  repair of Bilateral inguinal hernias with Large 3 D mesh Open primary umbilical hernia repair   Surgeon: Caroleen Hamman, MD FACS   Anesthesia: Gen. with endotracheal tube   Findings: inguinal hernias        Procedure Details  The patient was seen again in the Holding Room. The benefits, complications, treatment options, and expected outcomes were discussed with the patient. The risks of bleeding, infection, recurrence of symptoms, failure to resolve symptoms, recurrence of hernia, ischemic orchitis, chronic pain syndrome or neuroma, were discussed again. The likelihood of improving the patient's symptoms with return to their baseline status is good.  The patient and/or family concurred with the proposed plan, giving informed consent.  The patient was taken to Operating Room, identified  and the procedure verified as Laparoscopic Inguinal Hernia Repair. Laterality confirmed.  A Time Out was held and the above information confirmed.   Prior to the induction of general anesthesia, antibiotic prophylaxis was administered. VTE prophylaxis was in place. General endotracheal anesthesia was then administered and tolerated well. After the induction, the abdomen was prepped with Chloraprep and draped in the sterile fashion. The patient was positioned in the supine position.     Umbilical incision created and cut down technique used to enter the abdominal cavity. A hernia was identified and the sac was excised.   I changed my approach due to the umbilical hernia. Fascia elevated and incised and hasson trochar placed. Pneumoperitoneum obtained w/o HD changes. No evidence of bowel injuries.  Two 8 mm placed under direct vision. The laparoscopy  revealed large indirect defects. I inserted the needles and the mesh. The robot was brought ot the table and docked in the standard fashion, no collision between arms was observed. Instruments were kept under direct view at all times. We started on the right side were a flap was created. The sac was reduced and dissected free from adjacent structures. We preserved the vas and the vessels. Once dissection was completed a large 3D mesh was placed and secured with two interrupted vicryl attached to the pubic tubercle. There was good coverage of the direct, indirect and femoral spaces. The flap was closed with v lock suture.   Attention then was turned to the left side were a flap was created. The sac was reduced and dissected free from adjacent structures. We preserved the vas and the vessels. Once dissection was completed a large 3D mesh was placed and secured with two interrupted vicryl attached to the pubic tubercle. There was good coverage of the direct, indirect and femoral spaces. The flap was closed with v lock suture. Second look revealed no complications or injuries.     Once assuring that hemostasis was adequate the ports were removed and umbilical defect closed with multiple interrupted 0 ethibond sutures.Marland Kitchen 4-0 subcuticular Monocryl was used at all skin edges. Dermabond was placed.  Patient tolerated the procedure well. There were no complications. He was taken to the recovery room in stable condition.                 Caroleen Hamman, MD, FACS

## 2020-04-18 NOTE — Transfer of Care (Signed)
Immediate Anesthesia Transfer of Care Note  Patient: [name] [name]  Procedure(s) Performed: XI ROBOTIC ASSISTED INGUINAL HERNIA REPAIR WITH MESH (Bilateral ) XI ROBOT ASSISTED UMBILICAL HERNIA REPAIR (N/A )  Patient Location: PACU  Anesthesia Type:General  Level of Consciousness: awake, alert  and oriented  Airway & Oxygen Therapy: Patient Spontanous Breathing  Post-op Assessment: Report given to RN and Post -op Vital signs reviewed and stable  Post vital signs: Reviewed and stable  Last Vitals:  Vitals Value Taken Time  BP 137/64 04/18/20 1523  Temp 36.4 C 04/18/20 1520  Pulse 78 04/18/20 1523  Resp 15 04/18/20 1523  SpO2 100 % 04/18/20 1523    Last Pain:  Vitals:   04/18/20 1520  TempSrc:   PainSc: 0-No pain         Complications: No apparent anesthesia complications

## 2020-04-18 NOTE — Interval H&P Note (Signed)
History and Physical Interval Note:  04/18/2020 12:49 PM  [name] [name]  has presented today for surgery, with the diagnosis of Bilateral inguinal hernia.  The various methods of treatment have been discussed with the patient and family. After consideration of risks, benefits and other options for treatment, the patient has consented to  Procedure(s): XI ROBOTIC [place] (Bilateral) XI [place] (N/A) as a surgical intervention.  The patient's history has been reviewed, patient examined, no change in status, stable for surgery.  I have reviewed the patient's chart and labs.  Questions were answered to the patient's satisfaction.     [place]

## 2020-04-22 LAB — SURGICAL PATHOLOGY

## 2020-04-22 NOTE — Anesthesia Postprocedure Evaluation (Signed)
Anesthesia Post Note  Patient: [name] [name]  Procedure(s) Performed: XI ROBOTIC ASSISTED INGUINAL HERNIA REPAIR WITH MESH (Bilateral ) XI ROBOT ASSISTED UMBILICAL HERNIA REPAIR (N/A )  Patient location during evaluation: PACU Anesthesia Type: General Level of consciousness: awake and alert Pain management: pain level controlled Vital Signs Assessment: post-procedure vital signs reviewed and stable Respiratory status: spontaneous breathing, nonlabored ventilation and respiratory function stable Cardiovascular status: blood pressure returned to baseline and stable Postop Assessment: no apparent nausea or vomiting Anesthetic complications: no     Last Vitals:  Vitals:   04/18/20 1609 04/18/20 1642  BP: 140/71 (!) 121/59  Pulse: 64 65  Resp: 16 16  Temp: 36.5 C   SpO2: 95% 96%    Last Pain:  Vitals:   04/18/20 1642  TempSrc:   PainSc: [place]

## 2020-04-24 ENCOUNTER — Encounter: Payer: Self-pay | Admitting: Surgery

## 2020-04-24 ENCOUNTER — Ambulatory Visit (INDEPENDENT_AMBULATORY_CARE_PROVIDER_SITE_OTHER): Payer: Self-pay | Admitting: Surgery

## 2020-04-24 ENCOUNTER — Other Ambulatory Visit: Payer: Self-pay

## 2020-04-24 VITALS — BP 153/87 | HR 86 | Temp 97.9°F | Resp 15 | Ht 72.0 in | Wt 206.0 lb

## 2020-04-24 DIAGNOSIS — Z09 Encounter for follow-up examination after completed treatment for conditions other than malignant neoplasm: Secondary | ICD-10-CM

## 2020-04-24 NOTE — Progress Notes (Signed)
Status post robotic bilateral inguinal hernia repair and umbilical hernia repair.  He has done well.  Some minimal swelling around the scrotum.  No fevers no chills.  He is walking and tolerating regular diet.   PE: NAD  Abd: Incisions c/d/i, no infection or recurrence  A/P Doing very well w/o complications RTC prn

## 2020-04-24 NOTE — Patient Instructions (Addendum)
You may return to work with restrictions on Friday May 21st .   GENERAL POST-OPERATIVE PATIENT INSTRUCTIONS   WOUND CARE INSTRUCTIONS:  Keep a dry clean dressing on the wound if there is drainage. The initial bandage may be removed after 24 hours.  Once the wound has quit draining you may leave it open to air.  If clothing rubs against the wound or causes irritation and the wound is not draining you may cover it with a dry dressing during the daytime.  Try to keep the wound dry and avoid ointments on the wound unless directed to do so.  If the wound becomes bright red and painful or starts to drain infected material that is not clear, please contact your physician immediately.  If the wound is mildly pink and has a thick firm ridge underneath it, this is normal, and is referred to as a healing ridge.  This will resolve over the next 4-6 weeks.  BATHING: You may shower if you have been informed of this by your surgeon. However, Please do not submerge in a tub, hot tub, or pool until incisions are completely sealed or have been told by your surgeon that you may do so.  DIET:  You may eat any foods that you can tolerate.  It is a good idea to eat a high fiber diet and take in plenty of fluids to prevent constipation.  If you do become constipated you may want to take a mild laxative or take ducolax tablets on a daily basis until your bowel habits are regular.  Constipation can be very uncomfortable, along with straining, after recent surgery.  ACTIVITY:  You are encouraged to cough and deep breath or use your incentive spirometer if you were given one, every 15-30 minutes when awake.  This will help prevent respiratory complications and low grade fevers post-operatively if you had a general anesthetic.  You may want to hug a pillow when coughing and sneezing to add additional support to the surgical area, if you had abdominal or chest surgery, which will decrease pain during these times.  You are encouraged  to walk and engage in light activity for the next two weeks.  You should not lift more than 20 pounds for 6 weeks after surgery as it could put you at increased risk for complications.  Twenty pounds is roughly equivalent to a plastic bag of groceries. At that time- Listen to your body when lifting, if you have pain when lifting, stop and then try again in a few days. Soreness after doing exercises or activities of daily living is normal as you get back in to your normal routine.  MEDICATIONS:  Try to take narcotic medications and anti-inflammatory medications, such as tylenol, ibuprofen, naprosyn, etc., with food.  This will minimize stomach upset from the medication.  Should you develop nausea and vomiting from the pain medication, or develop a rash, please discontinue the medication and contact your physician.  You should not drive, make important decisions, or operate machinery when taking narcotic pain medication.  SUNBLOCK Use sun block to incision area over the next year if this area will be exposed to sun. This helps decrease scarring and will allow you avoid a permanent darkened area over your incision.  QUESTIONS:  Please feel free to call our office if you have any questions, and we will be glad to assist you.

## 2020-05-01 ENCOUNTER — Other Ambulatory Visit: Payer: Self-pay | Admitting: Physician Assistant

## 2020-05-01 ENCOUNTER — Telehealth: Payer: Medicare Other | Admitting: Surgery

## 2020-05-01 ENCOUNTER — Telehealth: Payer: Self-pay | Admitting: Physician Assistant

## 2020-05-01 NOTE — Telephone Encounter (Signed)
Spoke with patient he stated he was recovering from surgery and to call in back in about a month.

## 2020-05-15 ENCOUNTER — Ambulatory Visit: Payer: Medicare Other | Admitting: Physician Assistant

## 2020-06-25 ENCOUNTER — Other Ambulatory Visit: Payer: Self-pay | Admitting: Internal Medicine

## 2020-07-03 ENCOUNTER — Encounter: Payer: Self-pay | Admitting: Physician Assistant

## 2020-07-03 ENCOUNTER — Ambulatory Visit: Payer: Medicare Other | Admitting: Physician Assistant

## 2020-07-03 ENCOUNTER — Other Ambulatory Visit: Payer: Self-pay

## 2020-07-03 VITALS — BP 128/66 | HR 63 | Ht 72.0 in | Wt 208.0 lb

## 2020-07-03 DIAGNOSIS — I480 Paroxysmal atrial fibrillation: Secondary | ICD-10-CM

## 2020-07-03 DIAGNOSIS — I2510 Atherosclerotic heart disease of native coronary artery without angina pectoris: Secondary | ICD-10-CM

## 2020-07-03 DIAGNOSIS — R002 Palpitations: Secondary | ICD-10-CM

## 2020-07-03 DIAGNOSIS — I351 Nonrheumatic aortic (valve) insufficiency: Secondary | ICD-10-CM

## 2020-07-03 DIAGNOSIS — E039 Hypothyroidism, unspecified: Secondary | ICD-10-CM

## 2020-07-03 DIAGNOSIS — I5189 Other ill-defined heart diseases: Secondary | ICD-10-CM

## 2020-07-03 DIAGNOSIS — E7801 Familial hypercholesterolemia: Secondary | ICD-10-CM

## 2020-07-03 DIAGNOSIS — E785 Hyperlipidemia, unspecified: Secondary | ICD-10-CM

## 2020-07-03 DIAGNOSIS — I471 Supraventricular tachycardia, unspecified: Secondary | ICD-10-CM

## 2020-07-03 DIAGNOSIS — Z79899 Other long term (current) drug therapy: Secondary | ICD-10-CM

## 2020-07-03 NOTE — Progress Notes (Signed)
Office Visit    Patient Name: [name] [name] Date of Encounter: 07/03/2020  Primary Care Provider:  Trinna Post, PA-C Primary Cardiologist:  Kathlyn Sacramento, MD  Chief Complaint    Chief Complaint  Patient presents with  . Follow-up    3 Months; medications verbally reviewed with patient    69 yo male with history of CAD s/p PCI/DES to LAD 06/2019, PAF diagnosed 2004 not on [place] in the setting of CHA2DS2VASc score of 1, pSVT, mild AR, DD, hypothyroidism on Synthroid, severe possibly familial HLD, elevated liver function, strong family history of CAD on father's side, and here today for follow-up.   Past Medical History    Past Medical History:  Diagnosis Date  . Acquired hypothyroidism   . Coronary artery disease    a. cath 06/2019 s/p  orbital atherectomy and 2 overlapped DES to the mid and proximal LAD; angiopasty to 2nd dig  . ED (erectile dysfunction)   . Melanoma ([place])   . Paroxysmal atrial fibrillation (HCC)    Diagnosed in 2004  . Paroxysmal SVT (supraventricular tachycardia) ([place])   . Pure hypercholesterolemia    Past Surgical History:  Procedure Laterality Date  . CORONARY ATHERECTOMY N/A 07/05/2019   Procedure: CORONARY ATHERECTOMY;  Surgeon: Wellington Hampshire, MD;  Location: [facility];  Service: Cardiovascular;  Laterality: N/A;  . CORONARY BALLOON ANGIOPLASTY N/A 07/05/2019   Procedure: CORONARY BALLOON ANGIOPLASTY;  Surgeon: Wellington Hampshire, MD;  Location: [facility];  Service: Cardiovascular;  Laterality: N/A;  . CORONARY STENT INTERVENTION N/A 07/05/2019   Procedure: CORONARY STENT INTERVENTION;  Surgeon: Wellington Hampshire, MD;  Location: [facility];  Service: Cardiovascular;  Laterality: N/A;  . LEFT HEART CATH AND CORONARY ANGIOGRAPHY Left 06/26/2019   Procedure: LEFT HEART CATH AND CORONARY ANGIOGRAPHY;  Surgeon: Nelva Bush, MD;  Location: [facility];  Service: Cardiovascular;  Laterality: Left;  Marland Kitchen MELANOMA  EXCISION  02/17/1983  . NECK SURGERY    . XI ROBOTIC ASSISTED INGUINAL HERNIA REPAIR WITH MESH Bilateral 04/18/2020   Procedure: XI ROBOTIC ASSISTED INGUINAL HERNIA REPAIR WITH MESH;  Surgeon: Jules Husbands, MD;  Location: [facility];  Service: General;  Laterality: Bilateral;    Allergies  Allergies  Allergen Reactions  . Vicodin [Hydrocodone-Acetaminophen] Nausea And Vomiting    History of Present Illness    [name] [name] is a 69 y.o. male with PMH as above.   He was previously managed by Dr. Dion Saucier in [place], [place]. He was diagnosed with Afib in 2004, which was managed with metoprolol and subsequently discontinued due to nightmares. Since then, he has been managed by Diltiazem. Stress echo 2013 was unremarkable. Holter 12/2014 was predominantly NSR with rare PVCs and frequent PACs/couplets, 3 runs of SVT, and no atrial fibrillation. Treadmill stress test 03/2018 showed no evidence of ischemia. He was evaluated for increased palpitations 04/2019 which improved with reduction in caffeine intake. Zio 05/2019 was NSR with 27 episodes SVT and longest lasting 13 beats. Coronary CTA 06/2019 with subsequent recommendation for cardiac cath. Diagnostic LHC 06/2019 showed severe single vessel CAD with 80-90% pLAD stenosis and heavy calcification, as well as mild to moderate nonobstructive CAD of mLAD, mLCx, mRCA. He also had elevated LVEDP with normal LV contraction consistent with DD. He underwent orbital atherectomy and overlapping DES x2 to p-mLAD extending into ostium with balloon angioplasty of ostial D2. The procedure was complicated by non-flow limiting dissection in the ostial diagonal with normal flow  that did not require treatment. He was last seen in clinic 04/01/20, at which time he reported ongoing palpitations and need for preop evaluation for upcoming inguinal hernia surgery. He reported stress at work and pain as the triggers for his palpitations.   Today, he returns to clinic and notes palpitations  at night and when he is still and trying to fall asleep. He reports recent increase in energy, attributed to starting "Balance of nature," which is a vitamin blend from food. He states that he most notices his energy when he is able to bounce up from a bended knee. He denies chest pain, dyspnea, pnd, orthopnea, n, v, dizziness, syncope, edema, weight gain, or early satiety. He denies s/sx of bleeding. He reports medication compliance.   Home Medications    Prior to Admission medications   Medication Sig Start Date End Date Taking? Authorizing Provider  acetaminophen (TYLENOL) 500 MG tablet Take 500 mg by mouth every 6 (six) hours as needed.   Yes [provider]  aspirin EC 81 MG tablet Take 81 mg by mouth daily.    Yes [provider]  cetirizine (ZYRTEC) 10 MG tablet Take 10 mg by mouth at bedtime.    Yes [provider]  clopidogrel (PLAVIX) 75 MG tablet Take 1 tablet by mouth once daily 06/25/20  Yes End, Harrell Gave, MD  diltiazem (CARDIZEM CD) 240 MG 24 hr capsule Take 1 capsule (240 mg total) by mouth daily. 04/01/20 07/03/20 Yes Loel Dubonnet, NP  ezetimibe (ZETIA) 10 MG tablet Take 1 tablet (10 mg total) by mouth every evening. 02/05/20  Yes Dunn, Areta Haber, PA-C  levothyroxine (SYNTHROID) 25 MCG tablet TAKE 1 TABLET BY MOUTH ONCE DAILY BEFORE BREAKFAST Patient taking differently: TAKE 1 TABLET BY MOUTH ONCE DAILY BEFORE BREAKFAST 04/03/20  Yes Carles Collet M, PA-C  nitroGLYCERIN (NITROSTAT) 0.4 MG SL tablet Place 1 tablet (0.4 mg total) under the tongue every 5 (five) minutes as needed for chest pain. 06/26/19 07/03/20 Yes End, Harrell Gave, MD  pantoprazole (PROTONIX) 40 MG tablet Take 40 mg by mouth daily. 02/04/20  Yes [provider]  rosuvastatin (CRESTOR) 40 MG tablet Take 1 tablet by mouth once daily 05/01/20  Yes Dunn, Ryan M, PA-C  VITAMIN D, CHOLECALCIFEROL, PO Take by mouth daily.   Yes [provider]    Review of Systems    He denies  chest pain, dyspnea, pnd, orthopnea, n, v, dizziness, syncope, edema, weight gain, or early satiety. He reports palpitations at night when he goes to bed.   All other systems reviewed and are otherwise negative except as noted above.  Physical Exam    VS:  BP 128/66 (BP Location: Left Arm, Patient Position: Sitting, Cuff Size: Normal)   Pulse 63   Ht 6' (1.829 m)   Wt (!) 208 lb (94.3 kg)   SpO2 97%   BMI 28.21 kg/m  , BMI Body mass index is 28.21 kg/m. GEN: Well nourished, well developed, in no acute distress. HEENT: normal. Neck: Supple, no JVD, carotid bruits, or masses. Cardiac: RRR, trace diastolic murmur, rubs, or gallops. No clubbing, cyanosis, edema.  Radials/DP/PT 2+ and equal bilaterally.  Respiratory:  Respirations regular and unlabored, clear to auscultation bilaterally. GI: Soft, nontender, nondistended, BS + x 4. MS: no deformity or atrophy. Skin: warm and dry, no rash. Neuro:  Strength and sensation are intact. Psych: Normal affect.  Accessory Clinical Findings    ECG personally reviewed by me today - NSR, 63bpm- no acute  changes.  VITALS Reviewed today   Temp Readings from Last 3 Encounters:  04/24/20 97.9 F (36.6 C)  04/18/20 97.7 F (36.5 C) (Temporal)  04/03/20 97.9 F (36.6 C)   BP Readings from Last 3 Encounters:  07/03/20 128/66  04/24/20 (!) 153/87  04/18/20 (!) 121/59   Pulse Readings from Last 3 Encounters:  07/03/20 63  04/24/20 86  04/18/20 65    Wt Readings from Last 3 Encounters:  07/03/20 (!) 208 lb (94.3 kg)  04/24/20 206 lb (93.4 kg)  04/18/20 203 lb (92.1 kg)     LABS  reviewed today   Lab Results  Component Value Date   WBC 6.2 04/16/2020   HGB 14.5 04/16/2020   HCT 41.9 04/16/2020   MCV 85.9 04/16/2020   PLT 165 04/16/2020   Lab Results  Component Value Date   CREATININE 0.74 04/16/2020   BUN 21 04/16/2020   NA 139 04/16/2020   K 3.7 04/16/2020   CL 106 04/16/2020   CO2 24 04/16/2020   Lab Results  Component  Value Date   ALT 56 (H) 08/17/2019   AST 40 08/17/2019   ALKPHOS 77 08/17/2019   BILITOT 0.5 08/17/2019   Lab Results  Component Value Date   CHOL 159 08/17/2019   HDL 51 08/17/2019   LDLCALC 89 08/17/2019   TRIG 105 08/17/2019   CHOLHDL 3.1 08/17/2019    No results found for: HGBA1C Lab Results  Component Value Date   TSH 2.987 05/04/2019     STUDIES/PROCEDURES reviewed today    2D Echo 04/2018: 1. The left ventricle has normal systolic function, with an ejection fraction of 55-60%. The cavity size was normal. There is mild concentric left ventricular hypertrophy. Left ventricular diastolic parameters were normal. 2. The right ventricle has normal systolic function. The cavity was normal. There is no increase in right ventricular wall thickness. 3. The mitral valve is grossly normal. 4. The tricuspid valve is grossly normal. 5. The aortic valve has an indeterminate number of cusps. Aortic valve regurgitation is mild by color flow Doppler. __________  CTA chest/abd/pelvis: 1. No dissection. No PE. No aortic aneurysm. 2. Atherosclerotic changes are noted throughout the thoracic and abdominal aorta. 3. No acute intra-abdominal abnormality. 4. Severe sigmoid diverticulosis without CT evidence of diverticulitis. 5. Enlarged prostate gland. Bilateral fat containing inguinal hernias are noted.  Zio 05/2019: Normal sinus rhythm with an average heart rate of 73 bpm. 27 episodes of short SVTs. The longest lasted 13 beats. __________  Coronary CTA/FFR: 1. Left Main: 0.98. 2. LAD: Ostial: 0.98, proximal: 0.50. 3. LCX: Proximal: 0.95, mid: 078. 4. OM1: 0.77. 5. RCA: Proximal: 0.98, mid: 0.82, PDA: 0.79.  IMPRESSION: 1. CT FFR analysis showed severe stenosis in the proximal LAD, mid RCA, mid LCX artery. A cardiac catheterization is recommended. __________  [facility] 06/2019: Conclusions: 1. Severe single-vessel CAD with 80-90% proximal LAD stenosis with heavy  calcification. 2. Mild to moderate, non-obstructive CAD involving mid LAD, mid LCx, and mid RCA. 3. Elevated LVEDP with normal left ventricular contraction consistent with diastolic dysfunction.  Recommendations: 1. Plan for staged PCI to proximal LAD at [facility] using atherectomy. Will start patient on dual antiplatelet therapy with aspirin and clopidogrel. Long-term, transitioning Mr. Burbach to NOAC + clopidogrel will need to be considered with his history of paroxysmal atrial fibrillation and CHADSVASC score of at least 2 (age + CAD). 2. Aggressive secondary prevention. __________  PCI 06/2019:  Mid LAD lesion is 55% stenosed.  Prox Cx  to Mid Cx lesion is 40% stenosed.  Mid RCA lesion is 30% stenosed.  Prox LAD to Mid LAD lesion is 85% stenosed.  2nd Diag lesion is 50% stenosed.  Post intervention, there is a 0% residual stenosis.  A drug-eluting stent was successfully placed using a STENT RESOLUTE NZVJ2.8A06.  Post intervention, there is a 0% residual stenosis.  A drug-eluting stent was successfully placed using a STENT RESOLUTE ONYX 3.0X8.  Post intervention, there is a 30% residual stenosis.  Balloon angioplasty was performed using a BALLOON SAPPHIRE 2.5X20.  Successful complex orbital atherectomy and 2 overlapped drug-eluting stent placement to the mid and proximal LAD extending into the ostium with balloon angioplasty of the ostial second diagonal. There was non-flow-limiting dissection in ostial diagonal with normal flow. This did not require treatment.  Recommendations: Dual antiplatelet therapy for at least 1 year. Aggressive treatment of risk factors.   Assessment & Plan    CAD --No recent CP or sx of angina. S/p complex orbital atherectomy and overlapping DES x2 to p-mLAD and PTCA of ostial Dw2 06/2019. No indication for repeat ischemic workup. Continue DAPT. He has previously not tolerated a BB due to bradycardia. Continue Crestor and  Zetia.  PAF --Isolated episode in 2004 without recurrence. Reports palpitations, referred to as his Afib. Zio monitor as above without evidence of PAF. No indication for [place] at this time given no evidence of repeat Afib. Not on BB due to bradycardic rate.   pSVT --Noted on 06/2019 Zio. Reports palpitations, felt mostly at night. Suspect palpitations pSVT over that of PAF as above. Will continue on current dose diltiazem, given he does not report that his palpitations are bothersome.   Aortic regurgitation --As noted in echo above. Continue BP control. Periodic echo.  Diastolic dysfunction --Euvolemic and well compensated on exam. Discussed compression stockings and leg elevation.   HLD --Continue current medications. He did not yet obtain his lipid panel and we will order this for him to obtain at his convenience.   Disposition: RTC 6 months or sooner if needed   .    Arvil Chaco, PA-C 07/03/2020

## 2020-07-03 NOTE — Patient Instructions (Addendum)
  Medication Instructions: Your physician recommends that you continue on your current medications as directed. Please refer to the Current Medication list given to you today.   *If you need a refill on your cardiac medications before your next appointment, please call your pharmacy*   Lab Work: Your physician recommends that you return for a FASTING lipid profile: - You will need to be fasting. Please do not have anything to eat or drink after midnight the morning you have the lab work. You may only have water or black coffee with no cream or sugar. - Please go to the [facility]. You will check in at the front desk to the right as you walk into the atrium. Valet Parking is offered if needed. - No appointment needed. You may go any day between 7 am and 6 pm.  If you have labs (blood work) drawn today and your tests are completely normal, you will receive your results only by: Marland Kitchen MyChart Message (if you have MyChart) OR . A paper copy in the mail If you have any lab test that is abnormal or we need to change your treatment, we will call you to review the results.   Testing/Procedures: None Ordered.   Follow-Up: At [facility], you and your health needs are our priority.  As part of our continuing mission to provide you with exceptional heart care, we have created designated Provider Care Teams.  These Care Teams include your primary Cardiologist (physician) and Advanced Practice Providers (APPs -  Physician Assistants and Nurse Practitioners) who all work together to provide you with the care you need, when you need it.  We recommend signing up for the patient portal called "MyChart".  Sign up information is provided on this After Visit Summary.  MyChart is used to connect with patients for Virtual Visits (Telemedicine).  Patients are able to view lab/test results, encounter notes, upcoming appointments, etc.  Non-urgent messages can be sent to your provider as well.   To learn  more about what you can do with MyChart, go to [email or web address].    Your next appointment:   6 month(s)  The format for your next appointment:   In Person  Provider:    You may see Kathlyn Sacramento, MD or one of the following Advanced Practice Providers on your designated Care Team:    Murray Hodgkins, NP  Christell Faith, PA-C  Marrianne Mood, PA-C    Other Instructions N/A

## 2020-07-04 ENCOUNTER — Other Ambulatory Visit: Payer: Self-pay | Admitting: Physician Assistant

## 2020-07-04 DIAGNOSIS — E039 Hypothyroidism, unspecified: Secondary | ICD-10-CM

## 2020-07-08 NOTE — Progress Notes (Signed)
Trena Platt Cummings,acting as a Education administrator for Trinna Post, PA-C.,have documented all relevant documentation on the behalf of Trinna Post, PA-C,as directed by  Trinna Post, PA-C while in the presence of Trinna Post, PA-C.  Established patient visit   Patient: [name] [name]   DOB: [birth year]   69 y.o. Male  MRN: [id number] Visit Date: 07/10/2020  Today's healthcare provider: Trinna Post, PA-C   Chief Complaint  Patient presents with  . Leg Pain  . Leg Swelling   Subjective    Knee Pain  The incident occurred more than 1 week ago. There was no injury mechanism. The pain is present in the left leg and left knee. The quality of the pain is described as aching. The patient is experiencing no pain. The pain has been fluctuating since onset. Associated symptoms include an inability to bear weight. Pertinent negatives include no numbness or tingling. He reports no foreign bodies present. The symptoms are aggravated by movement. He has tried immobilization and acetaminophen for the symptoms. The treatment provided mild relief.    Knee swelling goes down at night time, but starts as he walks throughout the day. Denies sob, chest pain.  Patient would like hernia that he had removed looked at.   Patient had hernia repaired on May 13th.      Medications: Outpatient Medications Prior to Visit  Medication Sig  . acetaminophen (TYLENOL) 500 MG tablet Take 500 mg by mouth every 6 (six) hours as needed.  Marland Kitchen aspirin EC 81 MG tablet Take 81 mg by mouth daily.   . cetirizine (ZYRTEC) 10 MG tablet Take 10 mg by mouth at bedtime.   . clopidogrel (PLAVIX) 75 MG tablet Take 1 tablet by mouth once daily  . ezetimibe (ZETIA) 10 MG tablet Take 1 tablet (10 mg total) by mouth every evening.  Marland Kitchen levothyroxine (SYNTHROID) 25 MCG tablet TAKE 1 TABLET BY MOUTH ONCE DAILY BEFORE BREAKFAST  . pantoprazole (PROTONIX) 40 MG tablet Take 40 mg by mouth daily.  Marland Kitchen VITAMIN D, CHOLECALCIFEROL, PO  Take by mouth daily.  . [DISCONTINUED] rosuvastatin (CRESTOR) 40 MG tablet Take 1 tablet by mouth once daily  . nitroGLYCERIN (NITROSTAT) 0.4 MG SL tablet Place 1 tablet (0.4 mg total) under the tongue every 5 (five) minutes as needed for chest pain.  . [DISCONTINUED] diltiazem (CARDIZEM CD) 240 MG 24 hr capsule Take 1 capsule (240 mg total) by mouth daily.   No facility-administered medications prior to visit.    Review of Systems  Neurological: Negative for tingling and numbness.      Objective    BP 137/72 (BP Location: Left Arm, Patient Position: Sitting, Cuff Size: Normal)   Pulse 67   Temp 98.5 F (36.9 C) (Oral)   Wt 208 lb 12.8 oz (94.7 kg)   BMI 28.32 kg/m    Physical Exam Vitals reviewed.  Constitutional:      Appearance: Normal appearance.  HENT:     Head: Normocephalic and atraumatic.     Right Ear: External ear normal.     Left Ear: External ear normal.  Eyes:     General: No scleral icterus.    Conjunctiva/sclera: Conjunctivae normal.  Cardiovascular:     Rate and Rhythm: Normal rate and regular rhythm.     Pulses: Normal pulses.     Heart sounds: Normal heart sounds.  Pulmonary:     Effort: Pulmonary effort is normal.     Breath sounds: Normal breath sounds.  Abdominal:    Musculoskeletal:     Right lower leg: No edema.     Left lower leg: No edema.  Skin:    General: Skin is warm and dry.  Neurological:     General: No focal deficit present.     Mental Status: He is alert and oriented to person, place, and time.  Psychiatric:        Mood and Affect: Mood normal.        Behavior: Behavior normal.        Thought Content: Thought content normal.        Judgment: Judgment normal.       No results found for any visits on 07/10/20.  Assessment & Plan    1. Left leg pain  Suspect this is due to venous dysfunction. Can consider [place] if not improving.   2. History of hernia repair     Return in about 3 weeks (around 07/31/2020) for  physical/ follow up .      ITrinna Post, PA-C, have reviewed all documentation for this visit. The documentation on 08/06/20 for the exam, diagnosis, procedures, and orders are all accurate and complete.  The entirety of the information documented in the History of Present Illness, Review of Systems and Physical Exam were personally obtained by me. Portions of this information were initially documented by Elonda Husky, CMA and reviewed by me for thoroughness and accuracy.   I spent 20 minutes dedicated to the care of this patient on the date of this encounter to include pre-visit review of records, face-to-face time with the patient discussing leg edema, and post visit ordering of testing.   Paulene Floor  [facility] [phone] (phone) [phone] (fax)  [place]

## 2020-07-10 ENCOUNTER — Other Ambulatory Visit: Payer: Self-pay

## 2020-07-10 ENCOUNTER — Ambulatory Visit: Payer: Medicare Other | Admitting: Physician Assistant

## 2020-07-10 VITALS — BP 137/72 | HR 67 | Temp 98.5°F | Wt 208.8 lb

## 2020-07-10 DIAGNOSIS — Z8719 Personal history of other diseases of the digestive system: Secondary | ICD-10-CM

## 2020-07-10 DIAGNOSIS — M79605 Pain in left leg: Secondary | ICD-10-CM

## 2020-07-10 DIAGNOSIS — Z9889 Other specified postprocedural states: Secondary | ICD-10-CM | POA: Diagnosis not present

## 2020-07-10 NOTE — Patient Instructions (Addendum)
TRY COMPRESSION STOCKINGS!!!   TRY HYDROCORTISONE CREAM ON HERNIA!!

## 2020-07-26 ENCOUNTER — Other Ambulatory Visit: Payer: Self-pay

## 2020-07-26 MED ORDER — ROSUVASTATIN CALCIUM 40 MG PO TABS: 40.0000 mg | ORAL_TABLET | Freq: Every day | ORAL | 3 refills | Status: DC

## 2020-07-30 ENCOUNTER — Other Ambulatory Visit: Payer: Self-pay

## 2020-07-30 DIAGNOSIS — R002 Palpitations: Secondary | ICD-10-CM

## 2020-07-30 DIAGNOSIS — I471 Supraventricular tachycardia: Secondary | ICD-10-CM

## 2020-07-30 MED ORDER — DILTIAZEM HCL ER COATED BEADS 240 MG PO CP24: 240.0000 mg | ORAL_CAPSULE | Freq: Every day | ORAL | 4 refills | Status: DC

## 2020-07-31 ENCOUNTER — Ambulatory Visit: Payer: Medicare Other | Admitting: Physician Assistant

## 2020-08-02 ENCOUNTER — Other Ambulatory Visit: Payer: Self-pay | Admitting: Family

## 2020-08-02 NOTE — Telephone Encounter (Signed)
Please advise if OK to refill or defer to PCP. Listed under historical provider. Med not discussed in last office note. Thank you!

## 2020-08-02 NOTE — Telephone Encounter (Signed)
Please defer to the patients pcp.

## 2020-08-05 ENCOUNTER — Other Ambulatory Visit: Payer: Self-pay | Admitting: Physician Assistant

## 2020-08-05 NOTE — Telephone Encounter (Signed)
Ok to refill? I don't think you have ever refilled this med before. Please advise. Thanks!

## 2020-08-05 NOTE — Telephone Encounter (Signed)
Valinda faxed refill request for the following medications: [address], [place] - fax  pantoprazole (PROTONIX) 40 MG tablet  Please advise. Thanks, [employer]

## 2020-08-06 ENCOUNTER — Telehealth: Payer: Self-pay | Admitting: Physician Assistant

## 2020-08-06 NOTE — Telephone Encounter (Signed)
Patient declined the Medicare Wellness Visit with [facility]  Feels he does not need at this time.

## 2020-08-07 ENCOUNTER — Ambulatory Visit: Payer: Medicare Other | Admitting: Physician Assistant

## 2020-08-14 ENCOUNTER — Other Ambulatory Visit: Payer: Self-pay | Admitting: Family

## 2020-08-15 ENCOUNTER — Other Ambulatory Visit: Payer: Self-pay | Admitting: Physician Assistant

## 2020-08-15 ENCOUNTER — Telehealth: Payer: Self-pay | Admitting: Physician Assistant

## 2020-08-15 DIAGNOSIS — K219 Gastro-esophageal reflux disease without esophagitis: Secondary | ICD-10-CM

## 2020-08-15 NOTE — Telephone Encounter (Signed)
Medication: pantoprazole (PROTONIX) 40 MG tablet [[id number]]   Has the patient contacted their pharmacy? YES  (Agent: If no, request that the patient contact the pharmacy for the refill.) (Agent: If yes, when and what did the pharmacy advise?)  Preferred Pharmacy (with phone number or street name): [facility], [place]  Phone:  [phone] Fax:  [phone]     Agent: Please be advised that RX refills may take up to 3 business days. We ask that you follow-up with your pharmacy.

## 2020-08-16 MED ORDER — PANTOPRAZOLE SODIUM 40 MG PO TBEC: 40.0000 mg | DELAYED_RELEASE_TABLET | Freq: Every day | ORAL | 1 refills | Status: DC

## 2020-08-16 NOTE — Telephone Encounter (Signed)
Patient called office back sating that he requesting Pantoprazole 2 weeks ago and has not heard back from our office, patient is stating that he has been off medication and wants to have refill called in today.  Patients most recent office visit was 08/06/20, this medication is marked historical in chart. Okay to put in refill? KW

## 2020-08-16 NOTE — Telephone Encounter (Signed)
Please advise if ok to refill Pantoprazole 40 mg qd.  Last filled Historical Provider.

## 2020-08-16 NOTE — Addendum Note (Signed)
Addended by: Trinna Post on: 08/16/2020 04:29 PM   Modules accepted: Orders

## 2020-08-28 ENCOUNTER — Ambulatory Visit
Admission: RE | Admit: 2020-08-28 | Discharge: 2020-08-28 | Disposition: A | Discharge status: Home / Self Care | Payer: Medicare Other | Attending: Physician Assistant | Admitting: Physician Assistant

## 2020-08-28 ENCOUNTER — Encounter: Payer: Self-pay | Admitting: Physician Assistant

## 2020-08-28 ENCOUNTER — Ambulatory Visit
Admission: RE | Admit: 2020-08-28 | Discharge: 2020-08-28 | Disposition: A | Discharge status: Home / Self Care | Payer: Medicare Other | Source: Ambulatory Visit | Attending: Physician Assistant | Admitting: Physician Assistant

## 2020-08-28 ENCOUNTER — Ambulatory Visit: Payer: Medicare Other | Admitting: Physician Assistant

## 2020-08-28 ENCOUNTER — Other Ambulatory Visit: Payer: Self-pay

## 2020-08-28 VITALS — BP 119/61 | HR 69 | Temp 98.4°F | Ht 72.0 in | Wt 205.4 lb

## 2020-08-28 DIAGNOSIS — M25562 Pain in left knee: Secondary | ICD-10-CM

## 2020-08-28 DIAGNOSIS — K219 Gastro-esophageal reflux disease without esophagitis: Secondary | ICD-10-CM | POA: Diagnosis not present

## 2020-08-28 DIAGNOSIS — E785 Hyperlipidemia, unspecified: Secondary | ICD-10-CM | POA: Diagnosis not present

## 2020-08-28 DIAGNOSIS — M79605 Pain in left leg: Secondary | ICD-10-CM | POA: Diagnosis not present

## 2020-08-28 DIAGNOSIS — E039 Hypothyroidism, unspecified: Secondary | ICD-10-CM

## 2020-08-28 NOTE — Progress Notes (Signed)
Established patient visit   Patient: [name] [name]   DOB: [birth year]   69 y.o. Male  MRN: [id number] Visit Date: 08/28/2020  Today's healthcare provider: Trinna Post, PA-C   Chief Complaint  Patient presents with  . Gastroesophageal Reflux  . Hyperlipidemia  . Hypothyroidism  . Knee Pain   Subjective    HPI  Lipid/Cholesterol, Follow-up  Last lipid panel Other pertinent labs  Lab Results  Component Value Date   CHOL 159 08/17/2019   HDL 51 08/17/2019   LDLCALC 89 08/17/2019   TRIG 105 08/17/2019   CHOLHDL 3.1 08/17/2019   Lab Results  Component Value Date   ALT 56 (H) 08/17/2019   AST 40 08/17/2019   PLT 165 04/16/2020   TSH 2.987 05/04/2019     He was last seen for this 6 months ago.  Management since that visit includes no changes.  He reports good compliance with treatment. He is not having side effects.   Symptoms: No chest pain No chest pressure/discomfort  No dyspnea No lower extremity edema  No numbness or tingling of extremity No orthopnea  No palpitations No paroxysmal nocturnal dyspnea  No speech difficulty No syncope   Current diet: well balanced Current exercise: none  The 10-year ASCVD risk score Mikey Bussing DC Jr., et al., 2013) is: 16.5%  GERD, Follow up:  The patient was last seen for GERD 6 months ago. Changes made since that visit include no changes.  He reports good compliance with treatment. He is not having side effects.Marland Kitchen  He IS experiencing heartburn. He is currently taking pantoprazole 40mg  once daily, and he reports that it has not helped.   Hypothyroid, follow-up  Lab Results  Component Value Date   TSH 2.987 05/04/2019   Wt Readings from Last 3 Encounters:  08/28/20 205 lb 6.4 oz (93.2 kg)  07/10/20 208 lb 12.8 oz (94.7 kg)  07/03/20 (!) 208 lb (94.3 kg)    He was last seen for hypothyroid 6 months ago.  Management since that visit includes no changes. He reports good compliance with treatment. He is not  having side effects.   Symptoms: No change in energy level No constipation  No diarrhea No heat / cold intolerance  No nervousness No palpitations   Knee pain Patient also mentions that the pain in his left knee has gotten worse. He reports that the pain is worse when walking. He has not taken anything OTC for this. He has had symptoms for at least 6 months.       Medications: Outpatient Medications Prior to Visit  Medication Sig  . acetaminophen (TYLENOL) 500 MG tablet Take 500 mg by mouth every 6 (six) hours as needed.  Marland Kitchen aspirin EC 81 MG tablet Take 81 mg by mouth daily.   . cetirizine (ZYRTEC) 10 MG tablet Take 10 mg by mouth at bedtime.   . clopidogrel (PLAVIX) 75 MG tablet Take 1 tablet by mouth once daily  . diltiazem (CARDIZEM CD) 240 MG 24 hr capsule Take 1 capsule (240 mg total) by mouth daily.  Marland Kitchen ezetimibe (ZETIA) 10 MG tablet Take 1 tablet (10 mg total) by mouth every evening.  Marland Kitchen levothyroxine (SYNTHROID) 25 MCG tablet TAKE 1 TABLET BY MOUTH ONCE DAILY BEFORE BREAKFAST  . pantoprazole (PROTONIX) 40 MG tablet Take 1 tablet (40 mg total) by mouth daily.  . rosuvastatin (CRESTOR) 40 MG tablet Take 1 tablet (40 mg total) by mouth daily.  Marland Kitchen VITAMIN D, CHOLECALCIFEROL, PO Take by  mouth daily.  . nitroGLYCERIN (NITROSTAT) 0.4 MG SL tablet Place 1 tablet (0.4 mg total) under the tongue every 5 (five) minutes as needed for chest pain.   No facility-administered medications prior to visit.    Review of Systems  Constitutional: Negative.   Respiratory: Negative.   Cardiovascular: Positive for leg swelling.  Gastrointestinal: Positive for nausea.  Endocrine: Negative.   Genitourinary: Negative.   Musculoskeletal: Positive for arthralgias, gait problem and myalgias.       Objective    BP 119/61   Pulse 69   Temp 98.4 F (36.9 C)   Ht 6' (1.829 m)   Wt 205 lb 6.4 oz (93.2 kg)   SpO2 97%   BMI 27.86 kg/m    Physical Exam Constitutional:      Appearance: Normal  appearance.  Cardiovascular:     Rate and Rhythm: Normal rate and regular rhythm.     Heart sounds: Normal heart sounds.  Pulmonary:     Effort: Pulmonary effort is normal.     Breath sounds: Normal breath sounds.  Musculoskeletal:     Right knee: Normal.     Left knee: Crepitus present. Tenderness present over the medial joint line.  Skin:    General: Skin is warm and dry.  Neurological:     Mental Status: He is alert and oriented to person, place, and time. Mental status is at baseline.  Psychiatric:        Mood and Affect: Mood normal.        Behavior: Behavior normal.       No results found for any visits on 08/28/20.  Assessment & Plan    1. Hyperlipidemia LDL goal <70  Previously well controlled Continue statin Repeat FLP and CMP Goal LDL < 100  - Comprehensive Metabolic Panel (CMET) - TSH - Lipid Profile - CBC with Differential  2. Gastroesophageal reflux disease, unspecified whether esophagitis present  Discussed alternatives to pantoprazole including dexilant. This was previously cost prohibitive to patient so right now patient prefers to stay with pantoprazole.   3. Hypothyroidism, unspecified type  Continue synthroid.   - TSH  4. Left leg pain   5. Acute pain of left knee  Suspect arthritis, counseled on conservative measures including tylenol, topical numbing agents, with progression to injections, etc. Xray as below.   - DG Knee Complete 4 Views Left; Future    No follow-ups on file.      ITrinna Post, PA-C, have reviewed all documentation for this visit. The documentation on 09/05/20 for the exam, diagnosis, procedures, and orders are all accurate and complete.  The entirety of the information documented in the History of Present Illness, Review of Systems and Physical Exam were personally obtained by me. Portions of this information were initially documented by [facility] and reviewed by me for thoroughness and accuracy.      Paulene Floor  [facility] [phone] (phone) [phone] (fax)  [place]

## 2020-08-28 NOTE — Patient Instructions (Signed)
Lipid Profile Test  Why am I having this test?  The lipid profile test can be used to help evaluate your risk for developing heart disease. The test is also used to monitor your levels during treatment for high cholesterol to see if you are reaching your goals.  What is being tested?  A lipid profile measures the following:   Total cholesterol. Cholesterol is a waxy, fat-like substance in your blood. If your total cholesterol level is high, this can increase your risk for heart disease.   High-density lipoprotein (HDL). This is known as the good cholesterol. Having high levels of HDL decreases your risk for heart disease. Your HDL level may be low if you smoke or do not get enough exercise.   Low-density lipoprotein (LDL). This is known as the bad cholesterol. This type causes plaque to build up in your arteries. Having a low level of LDL is best. Having high levels of LDL increases your risk for heart disease.   Cholesterol to HDL ratio. This is calculated by dividing your total cholesterol by your HDL cholesterol. The ratio is used by health care providers to determine your risk for heart disease. A low ratio is best.   Triglycerides. These are fats that your body can store or burn for energy. Low levels are best. Having high levels of triglycerides increases your risk for heart disease.  What kind of sample is taken?    A blood sample is required for this test. It is usually collected by inserting a needle into a blood vessel.  How do I prepare for this test?  Do not drink alcohol starting at least 24 hours before your test.  Follow any instructions from your health care provider about dietary restrictions before your test.  Do not eat or drink anything other than water after midnight on the night before the test, or as told by your health care provider.  Tell a health care provider about:   All medicines you are taking, including vitamins, herbs, eye drops, creams, and over-the-counter medicines.   Any  medical conditions you have.   Whether you are pregnant or may be pregnant.  How are the results reported?  Your test results will be reported as values that indicate your cholesterol and triglyceride levels. Your health care provider will compare your results to normal ranges that were established after testing a large group of people (reference ranges). Reference ranges may vary among labs and hospitals. For this test, common reference ranges are:  Total cholesterol   Adult or elderly: less than 200 mg/dL.   Child: 120-200 mg/dL.   Infant: 70-175 mg/dL.   Newborn: 53-135 mg/dL.  HDL   Male: greater than 45 mg/dL.   Male: greater than 55 mg/dL.  HDL reference values based on your risk for heart disease:   Low risk for heart disease:  ? Male: 60 mg/dL.  ? Male: 70 mg/dL.   Moderate risk for heart disease:  ? Male: 45 mg/dL.  ? Male: 55 mg/dL.   High risk for heart disease:  ? Male: 25 mg/dL.  ? Male: 35 mg/dL.  LDL   Adults: Your health care provider will determine a target level for LDL based on your risk for heart disease.  ? If you are at low risk, your LDL should be 130 mg/dL or less.  ? If you are at moderate risk, your LDL should be 100 mg/dL or less.  ? If you are at high risk, your LDL should   Male: 4.4.  Risk that is two times average (moderate risk): ? Male: 10.0. ? Male: 7.0.  Risk that is three times average (high risk): ? Male: 24.0. ? Male: 11.0. Triglycerides  Adult or elderly: ? Male: 40-160 mg/dL. ? Male: 35-135 mg/dL.  Children 32-26 years old: ? Male: 40-163 mg/dL. ? Male: 40-128 mg/dL.  Children 80-41 years old: ? Male: 36-138 mg/dL. ? Male: 41-138 mg/dL.  Children 83-82 years old: ? Male: 31-108  mg/dL. ? Male: 35-114 mg/dL.  Children 72-74 years old: ? Male: 30-86 mg/dL. ? Male: 32-99 mg/dL. Triglycerides should be less than 400 mg/dL even when you are not fasting. What do the results mean? Results that are within the reference ranges are considered normal. Total cholesterol, LDL, and triglyceride levels that are higher than the reference ranges can mean that you have an increased risk for heart disease. An HDL level that is lower than the reference range can also indicate an increased risk. Talk with your health care provider about what your results mean. Questions to ask your health care provider Ask your health care provider, or the department that is doing the test:  When will my results be ready?  How will I get my results?  What are my treatment options?  What other tests do I need?  What are my next steps? Summary  The lipid profile test can be used to help predict the likelihood that you will develop heart disease. It can also help monitor your cholesterol levels during treatment.  A lipid profile measures your total cholesterol, high-density lipoprotein (HDL), low-density lipoprotein (LDL), cholesterol to HDL ratio, and triglycerides.  Total cholesterol, LDL, and triglyceride levels that are higher than the reference ranges can indicate an increased risk for heart disease.  An HDL level that is lower than the reference range can indicate an increased risk for heart disease.  Talk with your health care provider about what your results mean. This information is not intended to replace advice given to you by your health care provider. Make sure you discuss any questions you have with your health care provider. Document Revised: 08/30/2017 Document Reviewed: 08/30/2017 Elsevier Patient Education  2020 [employer].

## 2020-08-29 ENCOUNTER — Telehealth: Payer: Self-pay

## 2020-08-29 LAB — TSH: TSH: 3.58 u[IU]/mL (ref 0.450–4.500)

## 2020-08-29 LAB — CBC WITH DIFFERENTIAL/PLATELET
Basophils Absolute: 0 10*3/uL (ref 0.0–0.2)
Basos: 0 %
EOS (ABSOLUTE): 0 10*3/uL (ref 0.0–0.4)
Eos: 1 %
Hematocrit: 41.5 % (ref 37.5–51.0)
Hemoglobin: 13.8 g/dL (ref 13.0–17.7)
Immature Grans (Abs): 0 10*3/uL (ref 0.0–0.1)
Immature Granulocytes: 0 %
Lymphocytes Absolute: 1.3 10*3/uL (ref 0.7–3.1)
Lymphs: 39 %
MCH: 29.6 pg (ref 26.6–33.0)
MCHC: 33.3 g/dL (ref 31.5–35.7)
MCV: 89 fL (ref 79–97)
Monocytes Absolute: 0.2 10*3/uL (ref 0.1–0.9)
Monocytes: 7 %
Neutrophils Absolute: 1.7 10*3/uL (ref 1.4–7.0)
Neutrophils: 53 %
Platelets: 151 10*3/uL (ref 150–450)
RBC: 4.67 x10E6/uL (ref 4.14–5.80)
RDW: 14.1 % (ref 11.6–15.4)
WBC: 3.3 10*3/uL — ABNORMAL LOW (ref 3.4–10.8)

## 2020-08-29 LAB — LIPID PANEL
Chol/HDL Ratio: 2.9 ratio (ref 0.0–5.0)
Cholesterol, Total: 135 mg/dL (ref 100–199)
HDL: 46 mg/dL (ref >39)
LDL Chol Calc (NIH): 71 mg/dL (ref 0–99)
Triglycerides: 96 mg/dL (ref 0–149)
VLDL Cholesterol Cal: 18 mg/dL (ref 5–40)

## 2020-08-29 LAB — COMPREHENSIVE METABOLIC PANEL
ALT: 38 IU/L (ref 0–44)
AST: 32 IU/L (ref 0–40)
Albumin/Globulin Ratio: 1.9 (ref 1.2–2.2)
Albumin: 4.3 g/dL (ref 3.8–4.8)
Alkaline Phosphatase: 67 IU/L (ref 44–121)
BUN/Creatinine Ratio: 24 (ref 10–24)
BUN: 20 mg/dL (ref 8–27)
Bilirubin Total: 0.5 mg/dL (ref 0.0–1.2)
CO2: 23 mmol/L (ref 20–29)
Calcium: 9.8 mg/dL (ref 8.6–10.2)
Chloride: 104 mmol/L (ref 96–106)
Creatinine, Ser: 0.82 mg/dL (ref 0.76–1.27)
GFR calc Af Amer: 104 mL/min/{1.73_m2} (ref >59)
GFR calc non Af Amer: 90 mL/min/{1.73_m2} (ref >59)
Globulin, Total: 2.3 g/dL (ref 1.5–4.5)
Glucose: 112 mg/dL — ABNORMAL HIGH (ref 65–99)
Potassium: 4 mmol/L (ref 3.5–5.2)
Sodium: 139 mmol/L (ref 134–144)
Total Protein: 6.6 g/dL (ref 6.0–8.5)

## 2020-08-29 NOTE — Telephone Encounter (Signed)
Patient advised of lab results via mychart and has read the providers comments.

## 2020-08-29 NOTE — Telephone Encounter (Signed)
-----   Message from Trinna Post, [place] sent at 08/29/2020  8:17 AM EDT ----- Can we please add A1c under hyperglycemia?   Hi Jacqueline,   Your thyroid, blood counts, and cholesterol are all well controlled. Your sugars are just slightly elevated. I am going to add an A1c test on your sample to further evaluate for diabetes.   Best, Carles Collet, PA-C

## 2020-09-06 ENCOUNTER — Telehealth: Payer: Self-pay | Admitting: Physician Assistant

## 2020-09-06 DIAGNOSIS — M79605 Pain in left leg: Secondary | ICD-10-CM

## 2020-09-06 NOTE — Telephone Encounter (Signed)
Pt called back and was relayed Adriana message.   Pt states that is perfect, he would be agreeable for [place] on Monday.  Please schedule for anytime Monday after 11am. Pt will look for message in mychart for time on Monday (hopefully) for [place].

## 2020-09-06 NOTE — Telephone Encounter (Signed)
Order placed

## 2020-09-06 NOTE — Telephone Encounter (Signed)
Patient was advised.  

## 2020-09-06 NOTE — Addendum Note (Signed)
Addended by: Trinna Post on: 09/06/2020 03:28 PM   Modules accepted: Orders

## 2020-09-06 NOTE — Telephone Encounter (Signed)
Generally these are ordered STAT. This isn't something I would be able to arrange this late in the day on a Friday afternoon. If patient is agreeable, I can arrange it likely for Monday.

## 2020-09-06 NOTE — Telephone Encounter (Signed)
Patient is calling because he has had 2 appts with Adriana regarding Left Knee Pain. Patient was dx with Minor arthritis. Patient states that the pain continues to be so bad that he is unable to walk on the leg. Patient is requesting an order for an ultrasound to see if he has a blood clot. Please advise CB- [phone]

## 2020-09-09 ENCOUNTER — Other Ambulatory Visit: Payer: Self-pay

## 2020-09-09 ENCOUNTER — Ambulatory Visit
Admission: RE | Admit: 2020-09-09 | Discharge: 2020-09-09 | Disposition: A | Discharge status: Home / Self Care | Payer: Medicare Other | Source: Ambulatory Visit | Attending: Physician Assistant | Admitting: Physician Assistant

## 2020-09-09 DIAGNOSIS — M79605 Pain in left leg: Secondary | ICD-10-CM | POA: Diagnosis not present

## 2020-09-11 ENCOUNTER — Ambulatory Visit (INDEPENDENT_AMBULATORY_CARE_PROVIDER_SITE_OTHER): Payer: Medicare Other | Admitting: Physician Assistant

## 2020-09-11 ENCOUNTER — Other Ambulatory Visit: Payer: Self-pay

## 2020-09-11 ENCOUNTER — Encounter: Payer: Self-pay | Admitting: Physician Assistant

## 2020-09-11 VITALS — BP 141/65 | HR 68 | Temp 98.3°F | Resp 16 | Ht 72.0 in | Wt 201.0 lb

## 2020-09-11 DIAGNOSIS — M1712 Unilateral primary osteoarthritis, left knee: Secondary | ICD-10-CM | POA: Diagnosis not present

## 2020-09-11 NOTE — Progress Notes (Signed)
Established patient visit   Patient: [name] [name]   DOB: 1951-06-25   69 y.o. Male  MRN: [id number] Visit Date: 09/11/2020  Today's healthcare provider: Trinna Post, PA-C   Chief Complaint  Patient presents with  . knee injection   Subjective    HPI  Patient presents today to have an steroid injection in his left knee. He has mild tricompartmental disease of his left knee on xray. He has had worsening pain recently that did not respond to OTC treatments.      Medications: Outpatient Medications Prior to Visit  Medication Sig  . acetaminophen (TYLENOL) 500 MG tablet Take 500 mg by mouth every 6 (six) hours as needed.  Marland Kitchen aspirin EC 81 MG tablet Take 81 mg by mouth daily.   . cetirizine (ZYRTEC) 10 MG tablet Take 10 mg by mouth at bedtime.   . clopidogrel (PLAVIX) 75 MG tablet Take 1 tablet by mouth once daily  . ezetimibe (ZETIA) 10 MG tablet Take 1 tablet (10 mg total) by mouth every evening.  Marland Kitchen levothyroxine (SYNTHROID) 25 MCG tablet TAKE 1 TABLET BY MOUTH ONCE DAILY BEFORE BREAKFAST  . pantoprazole (PROTONIX) 40 MG tablet Take 1 tablet (40 mg total) by mouth daily.  . rosuvastatin (CRESTOR) 40 MG tablet Take 1 tablet (40 mg total) by mouth daily.  Marland Kitchen VITAMIN D, CHOLECALCIFEROL, PO Take by mouth daily.  Marland Kitchen diltiazem (CARDIZEM CD) 240 MG 24 hr capsule Take 1 capsule (240 mg total) by mouth daily.  . nitroGLYCERIN (NITROSTAT) 0.4 MG SL tablet Place 1 tablet (0.4 mg total) under the tongue every 5 (five) minutes as needed for chest pain.   No facility-administered medications prior to visit.    Review of Systems  Constitutional: Negative.   Musculoskeletal: Positive for arthralgias and joint swelling.      Objective    BP (!) 141/65   Pulse 68   Temp 98.3 F (36.8 C)   Resp 16   Ht 6' (1.829 m)   Wt 201 lb (91.2 kg)   BMI 27.26 kg/m    Physical Exam Constitutional:      Appearance: Normal appearance.  Musculoskeletal:     Right knee: Normal.      Left knee: Swelling and effusion present.  Skin:    General: Skin is warm and dry.  Neurological:     Mental Status: He is alert and oriented to person, place, and time. Mental status is at baseline.  Psychiatric:        Mood and Affect: Mood normal.        Behavior: Behavior normal.     INJECTION: Patient was given informed consent,. Appropriate time out was taken. Area prepped and draped in usual sterile fashion. 1 cc of depo-medrol 40 mg/ml plus  4 cc of 1% lidocaine without epinephrine was injected into the L knee using a(n) anterolateral approach. Procedure was performed by Carles Collet, PA-C with my supervision.  The patient tolerated the procedure well. There were no complications. Post procedure instructions were given.   No results found for any visits on 09/11/20.  Assessment & Plan    1. Primary osteoarthritis of left knee  Reviewed xray, course of arthritis.     Return if symptoms worsen or fail to improve.      ITrinna Post, PA-C, have reviewed all documentation for this visit. The documentation on 09/13/20 for the exam, diagnosis, procedures, and orders are all accurate and complete.  The entirety of  the information documented in the History of Present Illness, Review of Systems and Physical Exam were personally obtained by me. Portions of this information were initially documented by Wilburt Finlay, CMA and reviewed by me for thoroughness and accuracy.     Paulene Floor  [facility] [phone] (phone) [phone] (fax)  [place]

## 2020-09-22 ENCOUNTER — Other Ambulatory Visit: Payer: Self-pay | Admitting: Internal Medicine

## 2020-09-30 ENCOUNTER — Other Ambulatory Visit: Payer: Self-pay | Admitting: Physician Assistant

## 2020-09-30 DIAGNOSIS — E039 Hypothyroidism, unspecified: Secondary | ICD-10-CM

## 2020-10-16 ENCOUNTER — Encounter: Payer: Self-pay | Admitting: Urology

## 2020-10-16 ENCOUNTER — Ambulatory Visit (INDEPENDENT_AMBULATORY_CARE_PROVIDER_SITE_OTHER): Payer: Medicare Other | Admitting: Urology

## 2020-10-16 ENCOUNTER — Other Ambulatory Visit: Payer: Self-pay

## 2020-10-16 VITALS — BP 122/60 | HR 71 | Ht 72.0 in | Wt 198.0 lb

## 2020-10-16 DIAGNOSIS — N5201 Erectile dysfunction due to arterial insufficiency: Secondary | ICD-10-CM | POA: Insufficient documentation

## 2020-10-16 DIAGNOSIS — R350 Frequency of micturition: Secondary | ICD-10-CM | POA: Diagnosis not present

## 2020-10-16 DIAGNOSIS — N401 Enlarged prostate with lower urinary tract symptoms: Secondary | ICD-10-CM | POA: Insufficient documentation

## 2020-10-16 MED ORDER — TAMSULOSIN HCL 0.4 MG PO CAPS: 0.4000 mg | ORAL_CAPSULE | Freq: Every day | ORAL | 1 refills | Status: DC

## 2020-10-16 MED ORDER — TADALAFIL 20 MG PO TABS: ORAL_TABLET | ORAL | 3 refills | Status: DC

## 2020-10-16 NOTE — Patient Instructions (Signed)
Venus compress band , Venoceal

## 2020-10-16 NOTE — Progress Notes (Signed)
10/16/2020 9:40 AM   [name] Sep 26, 1951 [id number]  Referring provider: Trinna Post, PA-C [address] [place],  [place] [address]  Chief Complaint  Patient presents with  . Erectile Dysfunction    HPI: 69 y.o. male presents for follow-up of erectile dysfunction.   Initially seen 09/22/2019 for progressively worsening ED  Had been on sildenafil which was partially effective and desired a trial of tadalafil  No greater efficacy with tadalafil as compared with sildenafil  Has partial firmness with PDE 5 inhibitor but difficulty maintaining   He has taken a prostate supplement with improvement in nocturia  Does have urinary frequency   PMH: Past Medical History:  Diagnosis Date  . Acquired hypothyroidism   . Coronary artery disease    a. cath 06/2019 s/p  orbital atherectomy and 2 overlapped DES to the mid and proximal LAD; angiopasty to 2nd dig  . ED (erectile dysfunction)   . Melanoma ([place])   . Paroxysmal atrial fibrillation ([facility])    Diagnosed in 2004  . Paroxysmal SVT (supraventricular tachycardia) ([place])   . Pure hypercholesterolemia     Surgical History: Past Surgical History:  Procedure Laterality Date  . CORONARY ATHERECTOMY N/A 07/05/2019   Procedure: CORONARY ATHERECTOMY;  Surgeon: Wellington Hampshire, MD;  Location: [place] [facility];  Service: Cardiovascular;  Laterality: N/A;  . CORONARY BALLOON ANGIOPLASTY N/A 07/05/2019   Procedure: CORONARY BALLOON ANGIOPLASTY;  Surgeon: Wellington Hampshire, MD;  Location: [place] [facility];  Service: Cardiovascular;  Laterality: N/A;  . CORONARY STENT INTERVENTION N/A 07/05/2019   Procedure: CORONARY STENT INTERVENTION;  Surgeon: Wellington Hampshire, MD;  Location: [place] [facility];  Service: Cardiovascular;  Laterality: N/A;  . LEFT HEART CATH AND CORONARY ANGIOGRAPHY Left 06/26/2019   Procedure: LEFT HEART CATH AND CORONARY ANGIOGRAPHY;  Surgeon: Nelva Bush, MD;  Location: [place] [facility];  Service: Cardiovascular;  Laterality: Left;  Marland Kitchen MELANOMA EXCISION  02/17/1983  . NECK SURGERY    . XI ROBOTIC ASSISTED INGUINAL HERNIA REPAIR WITH MESH Bilateral 04/18/2020   Procedure: XI ROBOTIC ASSISTED INGUINAL HERNIA REPAIR WITH MESH;  Surgeon: Jules Husbands, MD;  Location: [facility];  Service: General;  Laterality: Bilateral;    Home Medications:  Allergies as of 10/16/2020      Reactions   Vicodin [hydrocodone-acetaminophen] Nausea And Vomiting      Medication List       Accurate as of October 16, 2020  9:40 AM. If you have any questions, ask your nurse or doctor.        acetaminophen 500 MG tablet Commonly known as: TYLENOL Take 500 mg by mouth every 6 (six) hours as needed.   aspirin EC 81 MG tablet Take 81 mg by mouth daily.   cetirizine 10 MG tablet Commonly known as: ZYRTEC Take 10 mg by mouth at bedtime.   clopidogrel 75 MG tablet Commonly known as: PLAVIX Take 1 tablet by mouth once daily   diltiazem 240 MG 24 hr capsule Commonly known as: CARDIZEM CD Take 1 capsule (240 mg total) by mouth daily.   ezetimibe 10 MG tablet Commonly known as: ZETIA Take 1 tablet (10 mg total) by mouth every evening.   levothyroxine 25 MCG tablet Commonly known as: SYNTHROID TAKE 1 TABLET BY MOUTH ONCE DAILY BEFORE BREAKFAST   nitroGLYCERIN 0.4 MG SL tablet Commonly known as: Nitrostat Place 1 tablet (0.4 mg total) under the tongue every 5 (five) minutes as needed for chest pain.  pantoprazole 40 MG tablet Commonly known as: PROTONIX Take 1 tablet (40 mg total) by mouth daily.   rosuvastatin 40 MG tablet Commonly known as: CRESTOR Take 1 tablet (40 mg total) by mouth daily.   VITAMIN D (CHOLECALCIFEROL) PO Take by mouth daily.       Allergies:  Allergies  Allergen Reactions  . Vicodin [Hydrocodone-Acetaminophen] Nausea And Vomiting    Family History: Family History  Problem Relation Age of Onset  . Atrial fibrillation Mother   . Stroke Father    . Heart disease Father     Social History:  reports that he has never smoked. He has never used smokeless tobacco. He reports current alcohol use. He reports that he does not use drugs.   Physical Exam: BP 122/60   Pulse 71   Ht 6' (1.829 m)   Wt 198 lb (89.8 kg)   BMI 26.85 kg/m   Constitutional:  Alert and oriented, No acute distress. HEENT: [place] AT, moist mucus membranes.  Trachea midline, no masses. Cardiovascular: No clubbing, cyanosis, or edema. Respiratory: Normal respiratory effort, no increased work of breathing. Skin: No rashes, bruises or suspicious lesions. Neurologic: Grossly intact, no focal deficits, moving all 4 extremities. Psychiatric: Normal mood and affect.   Assessment & Plan:    1.  Erectile dysfunction  Has failed to PDE 5 inhibitors  Does have difficulty maintaining an erection and we discussed possibility of venoocclusive disease  Options were discussed including adding a venous compression band to PDE 5 inhibitor therapy; vacuum erection devices; intracavernosal injections and penile implant surgery if all options have not been successful  He was interested in obtaining a venous compression band  Tadalafil was refilled  2.  BPH with lower urinary tract symptoms  Complains of bothersome urinary frequency  Was interested in medical management and trial of tamsulosin sent to [place], [place] [address] [place], [place] [address] [phone]

## 2020-10-20 ENCOUNTER — Encounter: Payer: Self-pay | Admitting: Urology

## 2020-11-04 ENCOUNTER — Other Ambulatory Visit: Payer: Self-pay | Admitting: Cardiovascular Disease

## 2020-11-04 DIAGNOSIS — R002 Palpitations: Secondary | ICD-10-CM

## 2020-11-04 DIAGNOSIS — I471 Supraventricular tachycardia: Secondary | ICD-10-CM

## 2020-11-04 MED ORDER — DILTIAZEM HCL ER COATED BEADS 240 MG PO CP24: 240.0000 mg | ORAL_CAPSULE | Freq: Every day | ORAL | 0 refills | Status: DC

## 2020-11-04 NOTE — Telephone Encounter (Signed)
*  STAT* If patient is at the pharmacy, call can be transferred to refill team.   1. Which medications need to be refilled? (please list name of each medication and dose if known) diltiazem 240 mg  2. Which pharmacy/location (including street and city if local pharmacy) is medication to be sent to? [place]  3. Do they need a 30 day or 90 day supply? 90  Patient is out of medication today

## 2020-11-04 NOTE — Telephone Encounter (Signed)
Requested Prescriptions   Signed Prescriptions Disp Refills   diltiazem (CARDIZEM CD) 240 MG 24 hr capsule 90 capsule 0    Sig: Take 1 capsule (240 mg total) by mouth daily.    Authorizing Provider: Kathlyn Sacramento A    Ordering User: Britt Bottom

## 2020-12-17 ENCOUNTER — Ambulatory Visit: Payer: Medicare Other | Admitting: Cardiovascular Disease

## 2020-12-17 ENCOUNTER — Other Ambulatory Visit: Payer: Self-pay

## 2020-12-17 ENCOUNTER — Encounter: Payer: Self-pay | Admitting: Cardiovascular Disease

## 2020-12-17 VITALS — BP 134/64 | HR 63 | Ht 72.0 in | Wt 204.0 lb

## 2020-12-17 DIAGNOSIS — I480 Paroxysmal atrial fibrillation: Secondary | ICD-10-CM | POA: Diagnosis not present

## 2020-12-17 DIAGNOSIS — I2510 Atherosclerotic heart disease of native coronary artery without angina pectoris: Secondary | ICD-10-CM

## 2020-12-17 DIAGNOSIS — I471 Supraventricular tachycardia: Secondary | ICD-10-CM

## 2020-12-17 DIAGNOSIS — E7849 Other hyperlipidemia: Secondary | ICD-10-CM | POA: Diagnosis not present

## 2020-12-17 DIAGNOSIS — R002 Palpitations: Secondary | ICD-10-CM

## 2020-12-17 DIAGNOSIS — R079 Chest pain, unspecified: Secondary | ICD-10-CM

## 2020-12-17 MED ORDER — ROSUVASTATIN CALCIUM 40 MG PO TABS
40.0000 mg | ORAL_TABLET | Freq: Every day | ORAL | 3 refills | Status: DC
Start: 2020-12-17 — End: 2022-01-15

## 2020-12-17 MED ORDER — CLOPIDOGREL BISULFATE 75 MG PO TABS
75.0000 mg | ORAL_TABLET | Freq: Every day | ORAL | 2 refills | Status: DC
Start: 2020-12-17 — End: 2020-12-20

## 2020-12-17 MED ORDER — DILTIAZEM HCL ER COATED BEADS 240 MG PO CP24: 240.0000 mg | ORAL_CAPSULE | Freq: Every day | ORAL | 3 refills | Status: DC

## 2020-12-17 MED ORDER — EZETIMIBE 10 MG PO TABS: 10.0000 mg | ORAL_TABLET | Freq: Every evening | ORAL | 3 refills | Status: DC

## 2020-12-17 NOTE — Patient Instructions (Signed)

## 2020-12-17 NOTE — Progress Notes (Signed)
Cardiology Office Note   Date:  12/17/2020   ID:  [name] [name], DOB 12-28-1950, MRN [id number]  PCP:  Trinna Post, PA-C  Cardiologist:   Kathlyn Sacramento, MD   Chief Complaint  Patient presents with  . Follow-up    6 Months follow up. Medications verbally reviewed with patient.       History of Present Illness: [name] [name] is a 70 y.o. male who is here today to establish cardiovascular care regarding coronary artery disease and paroxysmal atrial fibrillation.  He was diagnosed with atrial fibrillation in 2004.  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  He had coronary CTA done in July 2020 that showed significant coronary artery disease.  This was followed by cardiac catheterization which showed 80 to 90% proximal LAD stenosis which was heavily calcified with mild to moderate nonobstructive disease in the mid LAD, mid left circumflex and mid RCA.  I performed successful orbital atherectomy and 2 overlapped drug-eluting stent placement to the proximal LAD extending into the ostium of balloon angioplasty of ostial second diagonal.  He has been on dual antiplatelet therapy since then and has been doing very well with no recent chest pain, shortness of breath or palpitations.   Past Medical History:  Diagnosis Date  . Acquired hypothyroidism   . Coronary artery disease    a. cath 06/2019 s/p  orbital atherectomy and 2 overlapped DES to the mid and proximal LAD; angiopasty to 2nd dig  . ED (erectile dysfunction)   . Melanoma ([facility])   . Paroxysmal atrial fibrillation ([facility])    Diagnosed in 2004  . Paroxysmal SVT (supraventricular tachycardia) ([facility])   . Pure hypercholesterolemia     Past Surgical History:  Procedure Laterality Date  . CORONARY ATHERECTOMY N/A 07/05/2019   Procedure: CORONARY ATHERECTOMY;  Surgeon: Wellington Hampshire, MD;  Location: [facility];   Service: Cardiovascular;  Laterality: N/A;  . CORONARY BALLOON ANGIOPLASTY N/A 07/05/2019   Procedure: CORONARY BALLOON ANGIOPLASTY;  Surgeon: Wellington Hampshire, MD;  Location: [facility];  Service: Cardiovascular;  Laterality: N/A;  . CORONARY STENT INTERVENTION N/A 07/05/2019   Procedure: CORONARY STENT INTERVENTION;  Surgeon: Wellington Hampshire, MD;  Location: [facility];  Service: Cardiovascular;  Laterality: N/A;  . LEFT HEART CATH AND CORONARY ANGIOGRAPHY Left 06/26/2019   Procedure: LEFT HEART CATH AND CORONARY ANGIOGRAPHY;  Surgeon: Nelva Bush, MD;  Location: [facility];  Service: Cardiovascular;  Laterality: Left;  Marland Kitchen MELANOMA EXCISION  02/17/1983  . NECK SURGERY    . XI ROBOTIC ASSISTED INGUINAL HERNIA REPAIR WITH MESH Bilateral 04/18/2020   Procedure: XI ROBOTIC ASSISTED INGUINAL HERNIA REPAIR WITH MESH;  Surgeon: Jules Husbands, MD;  Location: [facility];  Service: General;  Laterality: Bilateral;     Current Outpatient Medications  Medication Sig Dispense Refill  . acetaminophen (TYLENOL) 500 MG tablet Take 500 mg by mouth every 6 (six) hours as needed.    Marland Kitchen aspirin EC 81 MG tablet Take 81 mg by mouth daily.     . cetirizine (ZYRTEC) 10 MG tablet Take 10 mg by mouth at bedtime.     . clopidogrel (PLAVIX) 75 MG tablet Take 1 tablet by mouth once daily 90 tablet 0  . diltiazem (CARDIZEM CD) 240 MG 24 hr capsule Take 1 capsule (240 mg total) by mouth daily. 90 capsule 0  . ezetimibe (ZETIA)  10 MG tablet Take 1 tablet (10 mg total) by mouth every evening. 90 tablet 3  . levothyroxine (SYNTHROID) 25 MCG tablet TAKE 1 TABLET BY MOUTH ONCE DAILY BEFORE BREAKFAST 90 tablet 0  . nitroGLYCERIN (NITROSTAT) 0.4 MG SL tablet Place 1 tablet (0.4 mg total) under the tongue every 5 (five) minutes as needed for chest pain. 25 tablet 3  . pantoprazole (PROTONIX) 40 MG tablet Take 1 tablet (40 mg total) by mouth daily. 90 tablet 1  . rosuvastatin (CRESTOR) 40 MG tablet Take  1 tablet (40 mg total) by mouth daily. 90 tablet 3  . tadalafil (CIALIS) 20 MG tablet 1 tab 1 hour prior to intercourse 30 tablet 3  . VITAMIN D, CHOLECALCIFEROL, PO Take by mouth daily.     No current facility-administered medications for this visit.    Allergies:   Vicodin [hydrocodone-acetaminophen]    Social History:  The patient  reports that he has never smoked. He has never used smokeless tobacco. He reports current alcohol use. He reports that he does not use drugs.   Family History:  The patient's family history includes Atrial fibrillation in his mother; Heart disease in his father; Stroke in his father.  He later died of myocardial infarction at the age of 28.There is strong family history of hyperlipidemia and coronary artery disease on his father's side.   ROS:  Please see the history of present illness.   Otherwise, review of systems are positive for none.   All other systems are reviewed and negative.    PHYSICAL EXAM: VS:  BP 134/64 (BP Location: Left Arm, Patient Position: Sitting, Cuff Size: Normal)   Pulse 63   Ht 6' (1.829 m)   Wt 204 lb (92.5 kg)   SpO2 97%   BMI 27.67 kg/m  , BMI Body mass index is 27.67 kg/m. GEN: Well nourished, well developed, in no acute distress  HEENT: normal  Neck: no JVD, carotid bruits, or masses Cardiac: RRR; no murmurs, rubs, or gallops,no edema  Respiratory:  clear to auscultation bilaterally, normal work of breathing GI: soft, nontender, nondistended, + BS MS: no deformity or atrophy  Skin: warm and dry, no rash Neuro:  Strength and sensation are intact Psych: euthymic mood, full affect   EKG:  EKG is ordered today. The ekg ordered today demonstrates normal sinus rhythm with no significant ST or T wave changes.     Recent Labs: 08/28/2020: ALT 38; BUN 20; Creatinine, Ser 0.82; Hemoglobin 13.8; Platelets 151; Potassium 4.0; Sodium 139; TSH 3.580    Lipid Panel    Component Value Date/Time   CHOL 135 08/28/2020 0925    TRIG 96 08/28/2020 0925   HDL 46 08/28/2020 0925   CHOLHDL 2.9 08/28/2020 0925   CHOLHDL 3.2 05/11/2018 0845   VLDL 26 05/11/2018 0845   LDLCALC 71 08/28/2020 0925      Wt Readings from Last 3 Encounters:  12/17/20 204 lb (92.5 kg)  10/16/20 198 lb (89.8 kg)  09/11/20 201 lb (91.2 kg)        PAD Screen 02/18/2017  Previous PAD dx? No  Previous surgical procedure? No  Pain with walking? No  Feet/toe relief with dangling? No  Painful, non-healing ulcers? No  Extremities discolored? No      ASSESSMENT AND PLAN:  1.  Coronary artery disease involving native coronary arteries without angina: He is overall doing extremely well with no anginal symptoms.  I recommend continuing medical therapy.  Given overlapped stents in the proximal  and ostial LAD, going to keep him on long-term dual antiplatelet therapy as tolerated.  2. Paroxysmal atrial fibrillation: No recent documented active atrial fibrillation.  We will have to consider long-term anticoagulation if he develops recurrent atrial fibrillation  3. Familial hyperlipidemia: Continue treatment with high-dose rosuvastatin and ezetimibe.  Most recent lipid profile showed significant improvement with LDL down to 71. She will  Disposition:   FU with me in 1 year.   Signed,  Kathlyn Sacramento, MD  12/17/2020 4:37 PM    [place]

## 2020-12-20 ENCOUNTER — Other Ambulatory Visit: Payer: Self-pay | Admitting: Internal Medicine

## 2020-12-20 ENCOUNTER — Other Ambulatory Visit: Payer: Self-pay | Admitting: Physician Assistant

## 2020-12-20 DIAGNOSIS — E039 Hypothyroidism, unspecified: Secondary | ICD-10-CM

## 2020-12-21 ENCOUNTER — Other Ambulatory Visit: Payer: Self-pay | Admitting: Physician Assistant

## 2020-12-21 DIAGNOSIS — K219 Gastro-esophageal reflux disease without esophagitis: Secondary | ICD-10-CM

## 2020-12-25 ENCOUNTER — Other Ambulatory Visit: Payer: Self-pay | Admitting: Physician Assistant

## 2020-12-25 DIAGNOSIS — E039 Hypothyroidism, unspecified: Secondary | ICD-10-CM

## 2020-12-25 NOTE — Telephone Encounter (Signed)
Copied from [place] [id number]. Topic: Quick Communication - Rx Refill/Question >> Dec 25, 2020  4:20 PM Tessa Lerner A wrote: Medication: levothyroxine (SYNTHROID) 25 MCG   Has the patient contacted their pharmacy? Yes. Pharmacy directed patient to contact PCP  Preferred Pharmacy (with phone number or street name): [place], [place] - [place] Phone: [phone]  Agent: Please be advised that RX refills may take up to 3 business days. We ask that you follow-up with your pharmacy.

## 2021-01-21 NOTE — Progress Notes (Signed)
Established patient visit   Patient: [name] [name]   DOB: 1950/12/14   70 y.o. Male  MRN: [id number] Visit Date: 01/22/2021  Today's healthcare provider: Trinna Post, PA-C   Chief Complaint  Patient presents with  . Knee Pain   Subjective    Knee Pain  There was no injury mechanism. The pain is present in the left knee. The quality of the pain is described as aching and shooting. The pain is moderate. The pain has been improving since onset. Pertinent negatives include no inability to bear weight. The symptoms are aggravated by movement.    Patient reports that he is having the same symptoms as before when he was in the office in 09/2020. He reports that the cortisone injection did help for about 2 weeks, a slight resurgence and then went away completely for the next several monhts. He feels that he may need another one. He changed his diet and reports pain went away until two weeks ago.      Medications: Outpatient Medications Prior to Visit  Medication Sig  . acetaminophen (TYLENOL) 500 MG tablet Take 500 mg by mouth every 6 (six) hours as needed.  Marland Kitchen aspirin EC 81 MG tablet Take 81 mg by mouth daily.   . cetirizine (ZYRTEC) 10 MG tablet Take 10 mg by mouth at bedtime.   . clopidogrel (PLAVIX) 75 MG tablet Take 1 tablet by mouth once daily  . ezetimibe (ZETIA) 10 MG tablet Take 1 tablet (10 mg total) by mouth every evening.  Marland Kitchen levothyroxine (SYNTHROID) 25 MCG tablet TAKE 1 TABLET BY MOUTH ONCE DAILY BEFORE BREAKFAST  . pantoprazole (PROTONIX) 40 MG tablet Take 1 tablet by mouth once daily  . rosuvastatin (CRESTOR) 40 MG tablet Take 1 tablet (40 mg total) by mouth daily.  . tadalafil (CIALIS) 20 MG tablet 1 tab 1 hour prior to intercourse  . VITAMIN D, CHOLECALCIFEROL, PO Take by mouth daily.  Marland Kitchen diltiazem (CARDIZEM CD) 240 MG 24 hr capsule Take 1 capsule (240 mg total) by mouth daily.  . nitroGLYCERIN (NITROSTAT) 0.4 MG SL tablet Place 1 tablet (0.4 mg total) under the  tongue every 5 (five) minutes as needed for chest pain.   No facility-administered medications prior to visit.    Review of Systems     Objective    BP 126/72   Pulse 78   Temp 98.4 F (36.9 C)   Resp 16   Ht 6' (1.829 m)   Wt 204 lb (92.5 kg)   BMI 27.67 kg/m     Physical Exam Constitutional:      Appearance: Normal appearance.  Musculoskeletal:     Right knee: Normal.     Left knee: Swelling and crepitus present. Decreased range of motion.  Neurological:     Mental Status: He is alert.     INJECTION: Patient was given informed consent,. Appropriate time out was taken. Area prepped and draped in usual sterile fashion.1cc of depo-medrol 40 mg/ml plus 4cc of 1% lidocaine without epinephrine was injected into the L kneeusing a(n) anterolateralapproach. The patient tolerated the procedure well. There were no complications. Post procedure instructions were given.  No results found for any visits on 01/22/21.  Assessment & Plan    1. Osteoarthritis of left knee, unspecified osteoarthritis type  Explained limitations of steroid knee injections, may benefit from synvysc through orthopedic for next procedure.    Return if symptoms worsen or fail to improve.  ITrinna Post, PA-C, have reviewed all documentation for this visit. The documentation on 01/30/21 for the exam, diagnosis, procedures, and orders are all accurate and complete.  The entirety of the information documented in the History of Present Illness, Review of Systems and Physical Exam were personally obtained by me. Portions of this information were initially documented by [facility] and reviewed by me for thoroughness and accuracy.     Paulene Floor  [facility] [phone] (phone) [phone] (fax)  [place]

## 2021-01-22 ENCOUNTER — Encounter: Payer: Self-pay | Admitting: Physician Assistant

## 2021-01-22 ENCOUNTER — Other Ambulatory Visit: Payer: Self-pay

## 2021-01-22 ENCOUNTER — Ambulatory Visit: Payer: Medicare Other | Admitting: Physician Assistant

## 2021-01-22 VITALS — BP 126/72 | HR 78 | Temp 98.4°F | Resp 16 | Ht 72.0 in | Wt 204.0 lb

## 2021-01-22 DIAGNOSIS — M1712 Unilateral primary osteoarthritis, left knee: Secondary | ICD-10-CM | POA: Diagnosis not present

## 2021-01-23 ENCOUNTER — Other Ambulatory Visit: Payer: Self-pay | Admitting: Cardiovascular Disease

## 2021-01-23 DIAGNOSIS — R002 Palpitations: Secondary | ICD-10-CM

## 2021-01-23 DIAGNOSIS — I471 Supraventricular tachycardia: Secondary | ICD-10-CM

## 2021-01-27 ENCOUNTER — Other Ambulatory Visit: Payer: Self-pay | Admitting: Cardiovascular Disease

## 2021-01-27 DIAGNOSIS — R002 Palpitations: Secondary | ICD-10-CM

## 2021-01-27 DIAGNOSIS — I471 Supraventricular tachycardia: Secondary | ICD-10-CM

## 2021-02-04 ENCOUNTER — Encounter: Payer: Self-pay | Admitting: Physician Assistant

## 2021-02-04 ENCOUNTER — Ambulatory Visit (INDEPENDENT_AMBULATORY_CARE_PROVIDER_SITE_OTHER): Payer: Medicare Other | Admitting: Physician Assistant

## 2021-02-04 ENCOUNTER — Other Ambulatory Visit: Payer: Self-pay

## 2021-02-04 VITALS — BP 139/64 | HR 70 | Temp 98.1°F | Wt 201.6 lb

## 2021-02-04 DIAGNOSIS — S92001D Unspecified fracture of right calcaneus, subsequent encounter for fracture with routine healing: Secondary | ICD-10-CM | POA: Diagnosis not present

## 2021-02-04 DIAGNOSIS — M238X2 Other internal derangements of left knee: Secondary | ICD-10-CM | POA: Diagnosis not present

## 2021-02-04 NOTE — Progress Notes (Signed)
Established patient visit   Patient: [name] [name]   DOB: [birth year]   70 y.o. Male  MRN: [id number] Visit Date: 02/04/2021  Today's healthcare provider: Trinna Post, PA-C   Chief Complaint  Patient presents with  . Knee Pain  I,Zoeya Gramajo M Lennart Gladish,acting as a scribe for Trinna Post, PA-C.,have documented all relevant documentation on the behalf of Trinna Post, PA-C,as directed by  Trinna Post, PA-C while in the presence of Trinna Post, PA-C.  Subjective    Knee Pain  The incident occurred more than 1 week ago. The injury mechanism was a fall. The pain is present in the left knee and right foot. The quality of the pain is described as aching, burning, cramping, shooting and stabbing. The pain is at a severity of 6/10. The pain is mild. The pain has been constant since onset. Associated symptoms include an inability to bear weight. Pertinent negatives include no numbness or tingling. He reports no foreign bodies present. The symptoms are aggravated by movement and weight bearing. He has tried acetaminophen and non-weight bearing for the symptoms. The treatment provided no relief.    [place] of [place] at his mother's funeral. He was seen at [place] one week ago. Ultimately diagnosed with left MCL sprain and fracture of right calcaneus. Advised to wear walking boot but patient ultimately declined and opted to be seen when he returned home.      Medications: Outpatient Medications Prior to Visit  Medication Sig  . acetaminophen (TYLENOL) 500 MG tablet Take 500 mg by mouth every 6 (six) hours as needed.  Marland Kitchen aspirin EC 81 MG tablet Take 81 mg by mouth daily.   . cetirizine (ZYRTEC) 10 MG tablet Take 10 mg by mouth at bedtime.   . clopidogrel (PLAVIX) 75 MG tablet Take 1 tablet by mouth once daily  . ezetimibe (ZETIA) 10 MG tablet Take 1 tablet (10 mg total) by mouth every evening.  Marland Kitchen levothyroxine (SYNTHROID) 25 MCG tablet TAKE 1 TABLET BY MOUTH  ONCE DAILY BEFORE BREAKFAST  . pantoprazole (PROTONIX) 40 MG tablet Take 1 tablet by mouth once daily  . rosuvastatin (CRESTOR) 40 MG tablet Take 1 tablet (40 mg total) by mouth daily.  . tadalafil (CIALIS) 20 MG tablet 1 tab 1 hour prior to intercourse  . VITAMIN D, CHOLECALCIFEROL, PO Take by mouth daily.  Marland Kitchen diltiazem (CARDIZEM CD) 240 MG 24 hr capsule Take 1 capsule (240 mg total) by mouth daily.  . nitroGLYCERIN (NITROSTAT) 0.4 MG SL tablet Place 1 tablet (0.4 mg total) under the tongue every 5 (five) minutes as needed for chest pain.   No facility-administered medications prior to visit.    Review of Systems  Cardiovascular: Positive for leg swelling.  Neurological: Negative for tingling and numbness.       Objective    BP 139/64 (BP Location: Left Arm, Patient Position: Sitting, Cuff Size: Normal)   Pulse 70   Temp 98.1 F (36.7 C) (Oral)   Wt 201 lb 9.6 oz (91.4 kg)   SpO2 98%   BMI 27.34 kg/m     Physical Exam Constitutional:      Appearance: Normal appearance.  Musculoskeletal:     Left knee: Swelling present.       Legs:  Skin:    General: Skin is warm and dry.  Neurological:     General: No focal deficit present.     Mental Status: He is alert  and oriented to person, place, and time.  Psychiatric:        Mood and Affect: Mood normal.        Behavior: Behavior normal.       No results found for any visits on 02/04/21.  Assessment & Plan    1. Closed nondisplaced fracture of right calcaneus with routine healing, unspecified portion of calcaneus, subsequent encounter  - Ambulatory referral to Orthopedic Surgery  2. Mcl deficiency, knee, left  - Ambulatory referral to Orthopedic Surgery   Return if symptoms worsen or fail to improve.      ITrinna Post, PA-C, have reviewed all documentation for this visit. The documentation on 02/12/21 for the exam, diagnosis, procedures, and orders are all accurate and complete.  The entirety of the  information documented in the History of Present Illness, Review of Systems and Physical Exam were personally obtained by me. Portions of this information were initially documented by [facility] and reviewed by me for thoroughness and accuracy.     Paulene Floor  [facility] [phone] (phone) [phone] (fax)  [place]

## 2021-02-13 ENCOUNTER — Other Ambulatory Visit: Payer: Self-pay | Admitting: Physician Assistant

## 2021-02-13 DIAGNOSIS — K219 Gastro-esophageal reflux disease without esophagitis: Secondary | ICD-10-CM

## 2021-02-16 IMAGING — CR DG KNEE COMPLETE 4+V*L*
1 series · 4 of 4 positions shown · non-contrast
Comparison: None.

CLINICAL DATA: Medial left knee pain for 3 months

EXAM:
LEFT KNEE - COMPLETE 4+ VIEW

[Series 1: dg knee complete 4 views left · 0.14mm/px · 4 of 4 slices shown]
[im 1/4]
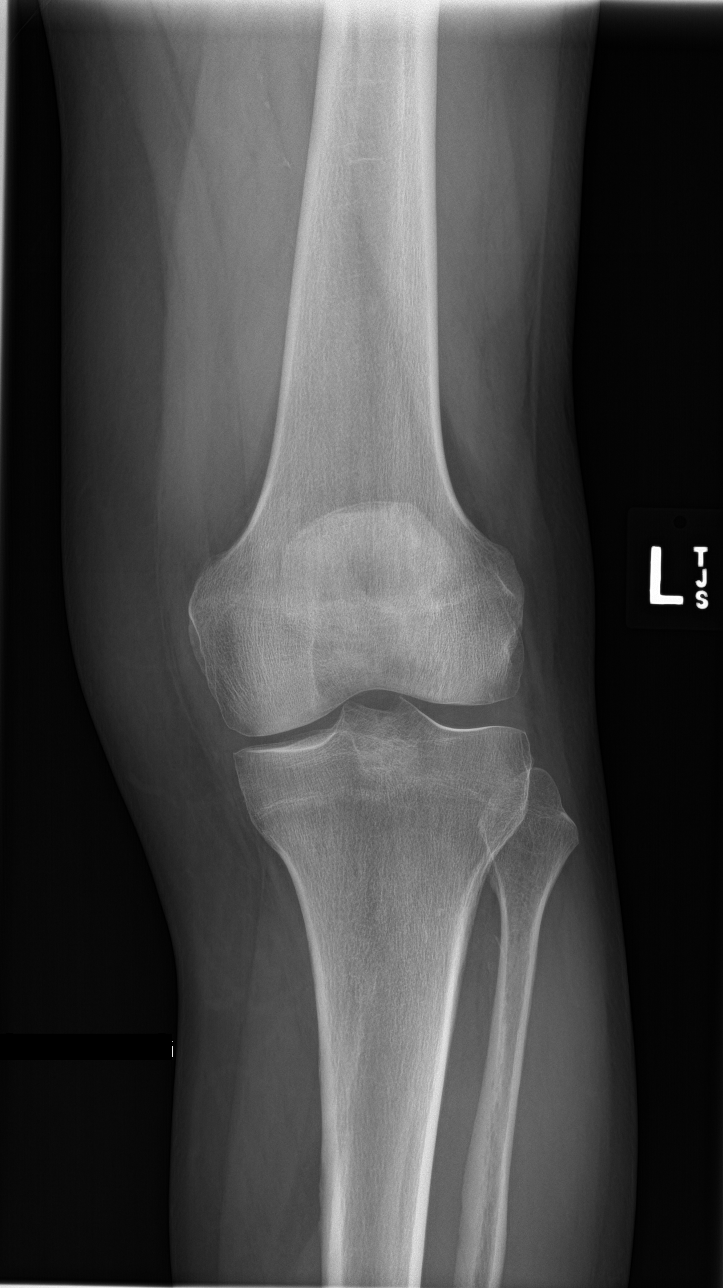
[im 2/4]
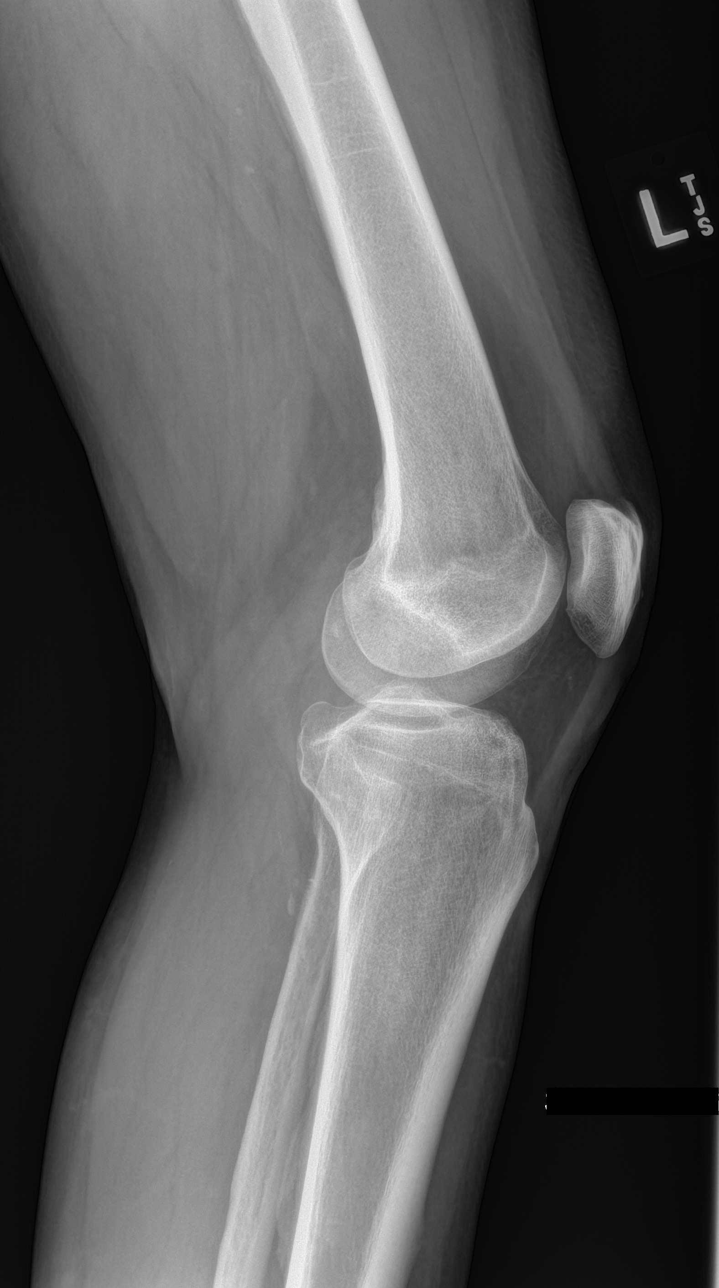
[im 3/4]
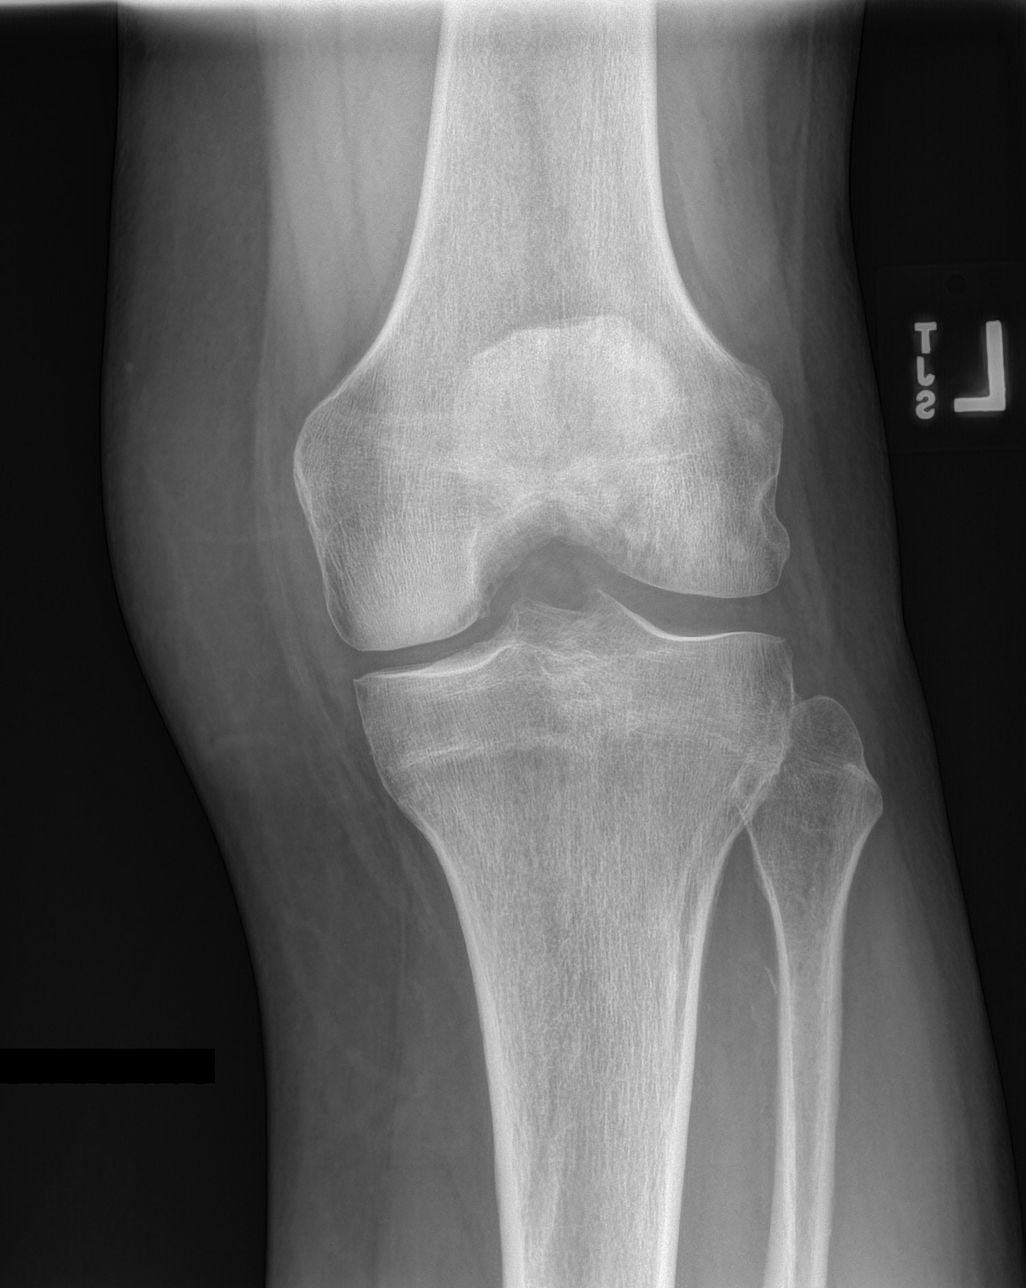
[im 4/4]
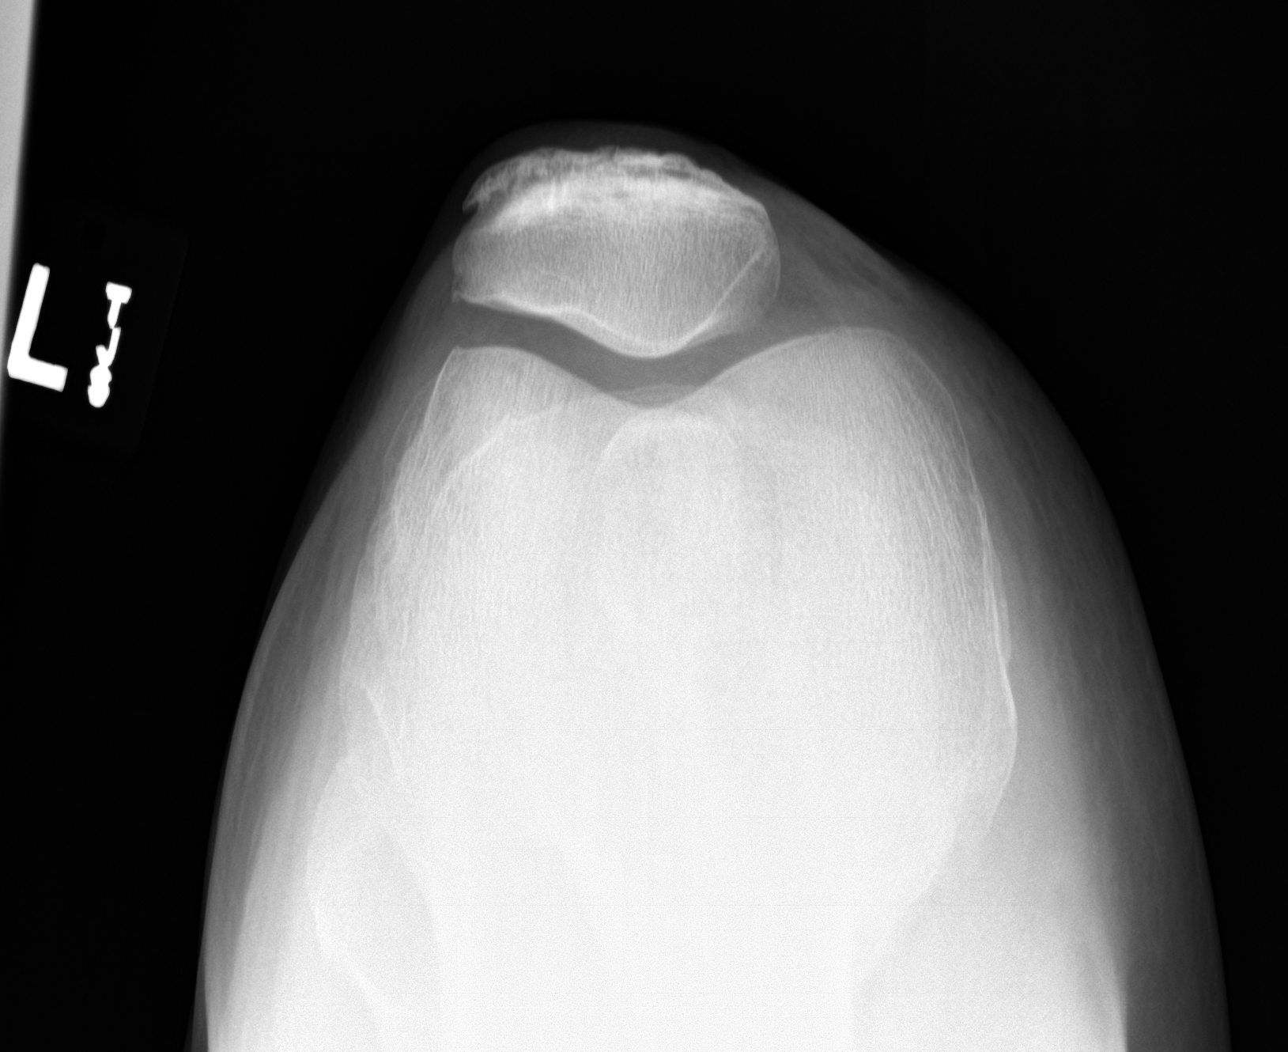

[4 of 4 positions shown; findings below may reference images not displayed]

FINDINGS: No evidence of fracture or dislocation. Trace joint effusion. Mild
medial compartment joint space narrowing. Small enthesophyte at the
quadriceps tendon insertion. Soft tissues are unremarkable.
IMPRESSION: Mild medial compartment degenerative changes with trace joint
effusion.

## 2021-02-17 ENCOUNTER — Telehealth: Payer: Self-pay | Admitting: Physician Assistant

## 2021-02-17 NOTE — Telephone Encounter (Signed)
Pt is calling to see if Dr. B would take him on as a patient. Please advise Cb- [phone]

## 2021-02-18 NOTE — Telephone Encounter (Signed)
Dr B will

## 2021-02-26 ENCOUNTER — Ambulatory Visit: Payer: Medicare Other | Admitting: Physician Assistant

## 2021-02-28 IMAGING — US US EXTREM LOW VENOUS*L*
1 series · 13 of 24 positions shown · non-contrast
Comparison: None.

CLINICAL DATA: 6-year-old male with history of left leg pain for 4
months.

EXAM:
LEFT LOWER EXTREMITY VENOUS DOPPLER ULTRASOUND
TECHNIQUE: Gray-scale sonography with graded compression, as well as color
Doppler and duplex ultrasound were performed to evaluate the left
lower extremity deep venous systems from the level of the common
femoral vein and including the common femoral, femoral, profunda
femoral, popliteal and calf veins including the posterior tibial,
peroneal and gastrocnemius veins when visible. Spectral Doppler was
utilized to evaluate flow at rest and with distal augmentation
maneuvers in the common femoral, femoral and popliteal veins. The
contralateral common femoral vein was also evaluated for comparison.

[Series 1: us venous img lower uni left (dvt) · portal-venous · 13 of 37 slices shown]
[im 1/37]
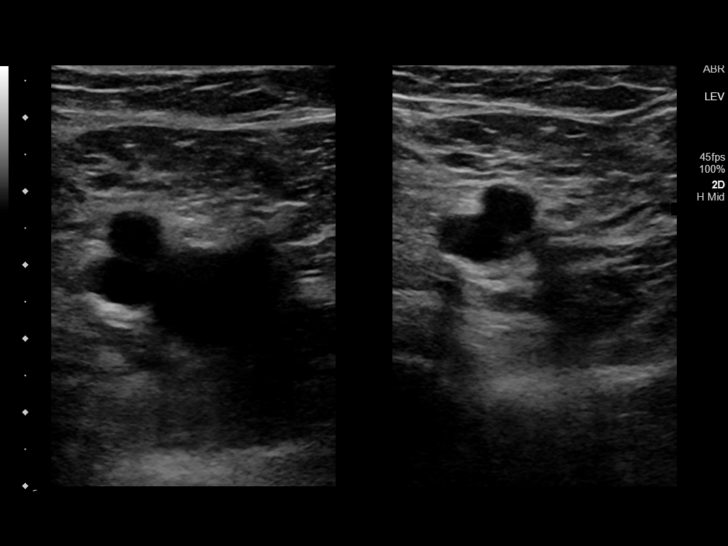
[im 4/37]
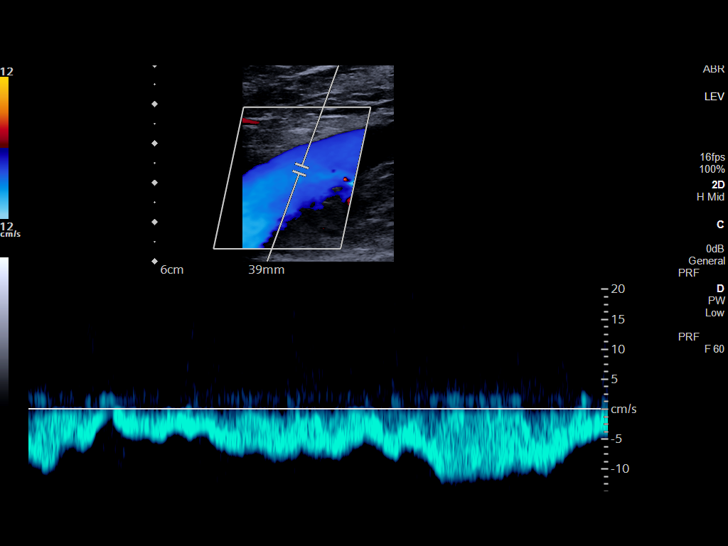
[im 7/37]
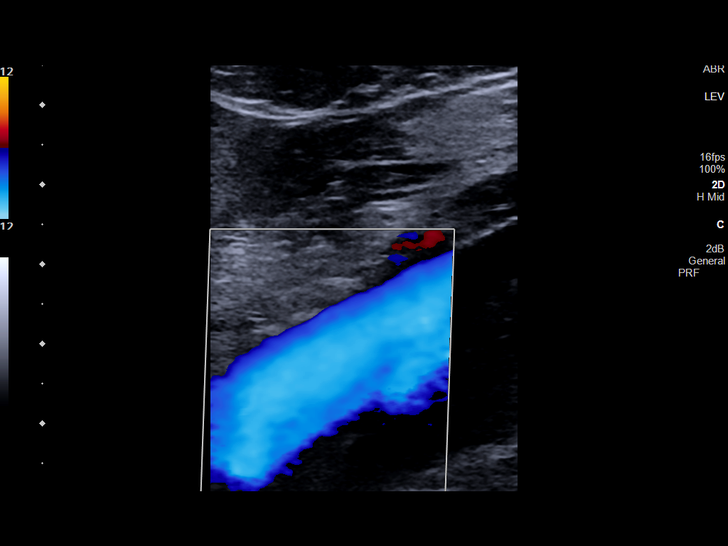
[im 10/37]
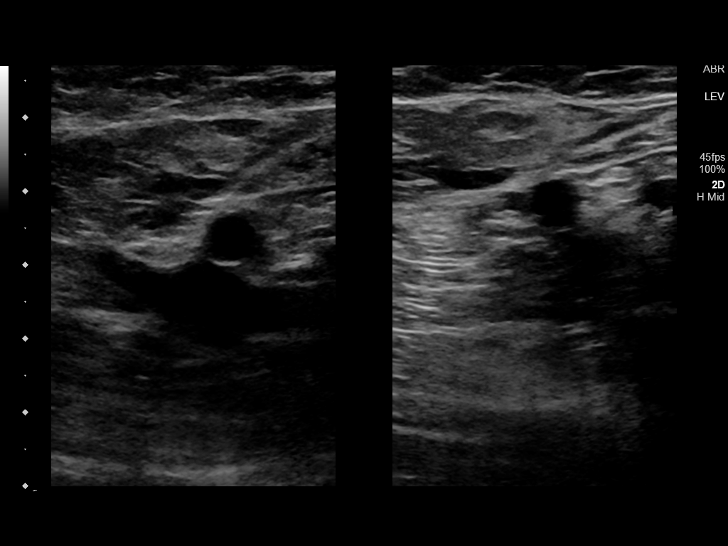
[im 13/37]
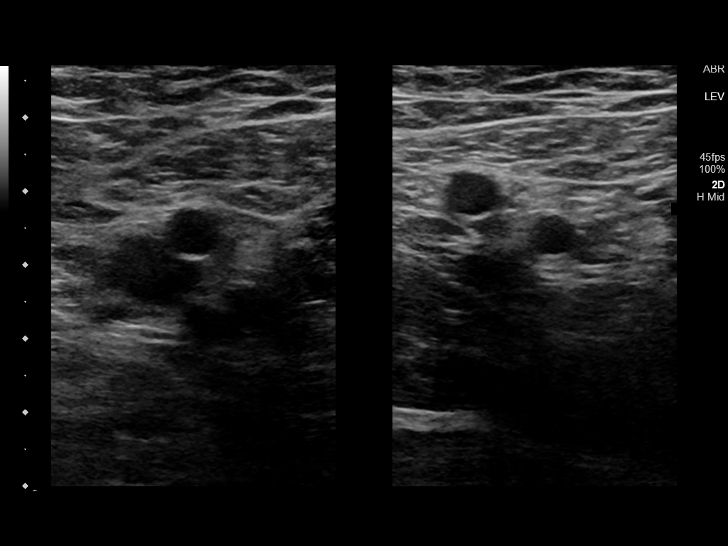
[im 16/37]
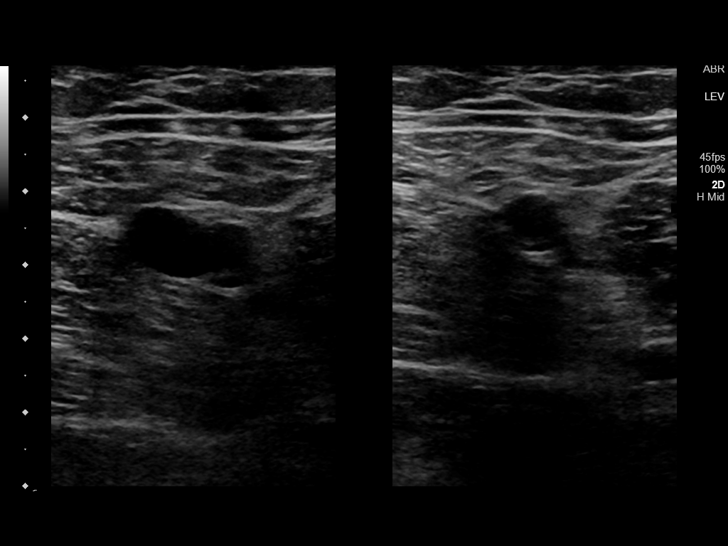
[im 19/37]
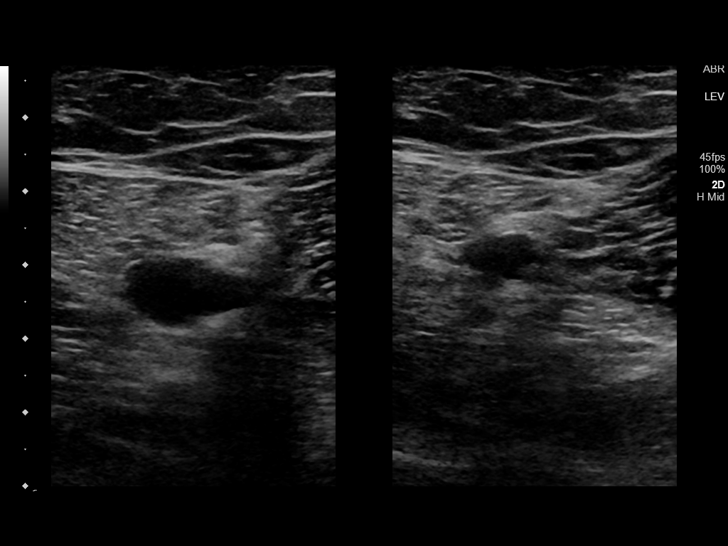
[im 21/37]
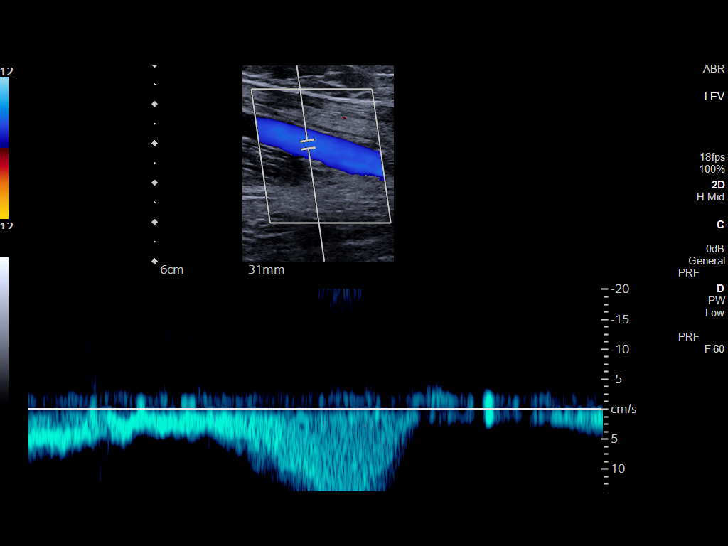
[im 24/37]
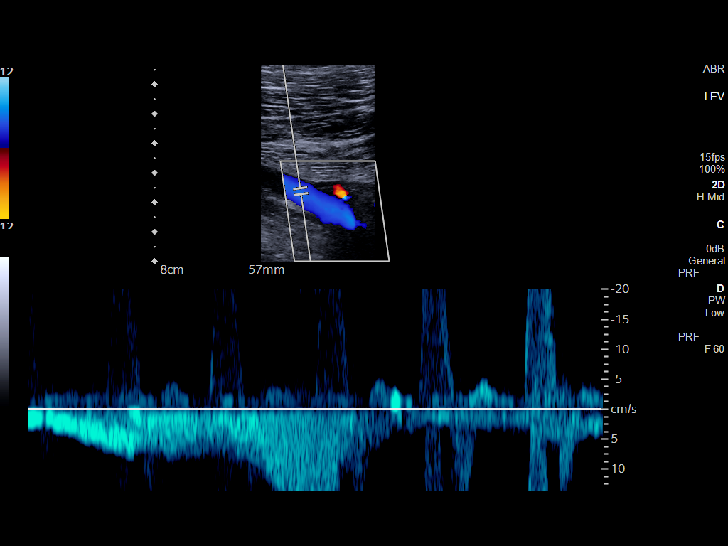
[im 27/37]
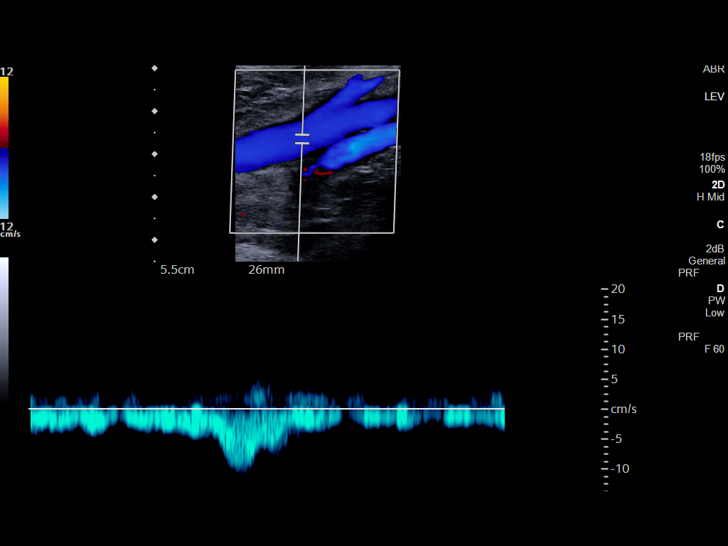
[im 30/37]
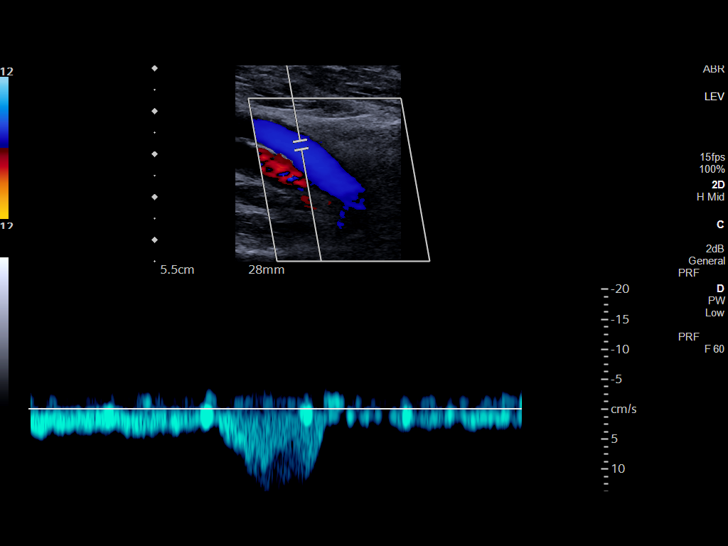
[im 33/37]
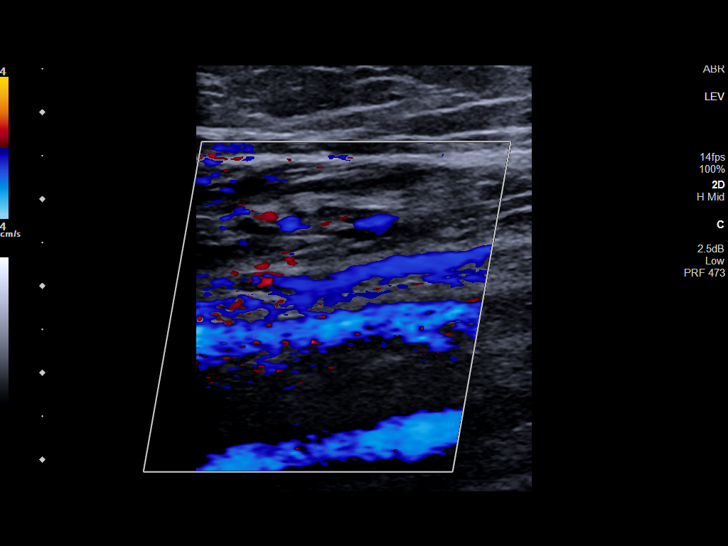
[im 37/37]
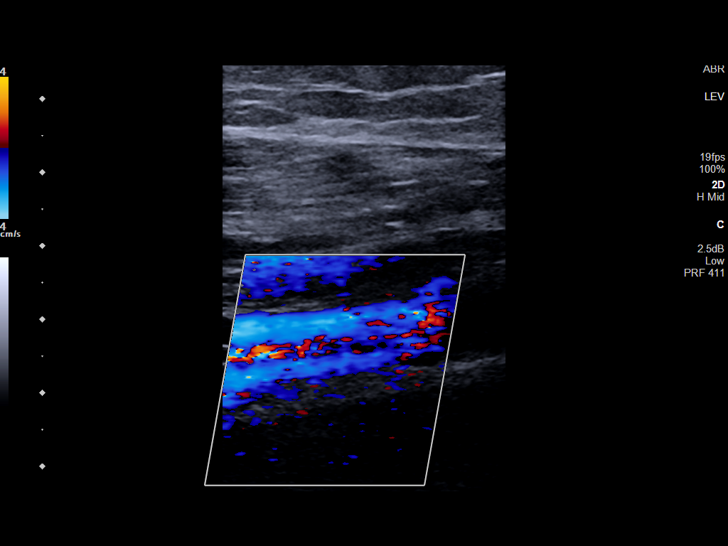

[13 of 24 positions shown; findings below may reference images not displayed]

FINDINGS: LEFT LOWER EXTREMITY

Common Femoral Vein: No evidence of thrombus. Normal
compressibility, respiratory phasicity and response to augmentation.

Central Greater Saphenous Vein: No evidence of thrombus. Normal
compressibility and flow on color Doppler imaging.

Central Profunda Femoral Vein: No evidence of thrombus. Normal
compressibility and flow on color Doppler imaging.

Femoral Vein: No evidence of thrombus. Normal compressibility,
respiratory phasicity and response to augmentation.

Popliteal Vein: No evidence of thrombus. Normal compressibility,
respiratory phasicity and response to augmentation.

Calf Veins: No evidence of thrombus. Normal compressibility and flow
on color Doppler imaging.

Other Findings:  None.

RIGHT LOWER EXTREMITY

Common Femoral Vein: No evidence of thrombus. Normal
compressibility, respiratory phasicity and response to augmentation.
IMPRESSION: No evidence of left lower extremity deep venous thrombosis.

## 2021-03-24 ENCOUNTER — Other Ambulatory Visit: Payer: Self-pay | Admitting: Physician Assistant

## 2021-03-24 DIAGNOSIS — E039 Hypothyroidism, unspecified: Secondary | ICD-10-CM

## 2021-03-24 NOTE — Telephone Encounter (Signed)
Requested medication (s) are due for refill today: yes  Requested medication (s) are on the active medication list: yes  Future visit scheduled: no  Notes to clinic:  TSH needs to be rechecked within 3 months after an abnormal result. Refill until TSH is due   Requested Prescriptions  Pending Prescriptions Disp Refills   levothyroxine (SYNTHROID) 25 MCG tablet [Pharmacy Med Name: Levothyroxine Sodium 25 MCG Oral Tablet] 90 tablet 0    Sig: TAKE 1 TABLET BY MOUTH ONCE DAILY BEFORE BREAKFAST      Endocrinology:  Hypothyroid Agents Failed - 03/24/2021  7:10 AM      Failed - TSH needs to be rechecked within 3 months after an abnormal result. Refill until TSH is due.      Passed - TSH in normal range and within 360 days    TSH  Date Value Ref Range Status  08/28/2020 3.580 0.450 - 4.500 uIU/mL Final          Passed - Valid encounter within last 12 months    Recent Outpatient Visits           1 month ago Closed nondisplaced fracture of right calcaneus with routine healing, unspecified portion of calcaneus, subsequent encounter   [place], PA-C   2 months ago Osteoarthritis of left knee, unspecified osteoarthritis type   [place], [place], [place]   6 months ago Primary osteoarthritis of left knee   [place], [place]   6 months ago Hyperlipidemia LDL goal <70   [place], [place]   8 months ago Left leg pain   [facility] [place], [place] B[place], [place]

## 2021-04-03 ENCOUNTER — Ambulatory Visit (INDEPENDENT_AMBULATORY_CARE_PROVIDER_SITE_OTHER): Payer: Medicare Other | Admitting: Family Medicine

## 2021-04-03 DIAGNOSIS — R059 Cough, unspecified: Secondary | ICD-10-CM

## 2021-04-03 DIAGNOSIS — J069 Acute upper respiratory infection, unspecified: Secondary | ICD-10-CM | POA: Diagnosis not present

## 2021-04-03 MED ORDER — PREDNISONE 20 MG PO TABS: 20.0000 mg | ORAL_TABLET | Freq: Every day | ORAL | 1 refills | Status: DC

## 2021-04-03 MED ORDER — BENZONATATE 200 MG PO CAPS: 200.0000 mg | ORAL_CAPSULE | Freq: Two times a day (BID) | ORAL | 0 refills | Status: DC | PRN

## 2021-04-03 NOTE — Progress Notes (Signed)
MyChart Video Visit    Virtual Visit via Video Note   This visit type was conducted due to national recommendations for restrictions regarding the COVID-19 Pandemic (e.g. social distancing) in an effort to limit this patient's exposure and mitigate transmission in our community. This patient is at least at moderate risk for complications without adequate follow up. This format is felt to be most appropriate for this patient at this time. Physical exam was limited by quality of the video and audio technology used for the visit.   Patient location: home  Provider location: office This had to be changed to a telephone call as patient was unable to get the microphone to work on his computer.  This was a telephone visit. I discussed the limitations of evaluation and management by telemedicine and the availability of in person appointments. The patient expressed understanding and agreed to proceed.  Patient: [name] [name]   DOB: 05/28/1951   70 y.o. Male  MRN: [id number] Visit Date: 04/03/2021  Today's healthcare provider: Wilhemena Durie, MD   No chief complaint on file.  Subjective    HPI  Very pleasant 70 year old gentleman continues to work.  5 days ago he developed a cough that is worse at night.  It is actually keeping him up at night and that is the reason he is calling.  It is productive of mild clear sputum.  No fevers no myalgias.  Mild headache when coughing.  He had a negative home COVID test in 2 days ago had a negative PCR COVID test.  He is taking Zyrtec and Flonase for this allergies.  He is allergic to hydrocodone which causes him significant nausea     Medications: Outpatient Medications Prior to Visit  Medication Sig  . acetaminophen (TYLENOL) 500 MG tablet Take 500 mg by mouth every 6 (six) hours as needed.  Marland Kitchen aspirin EC 81 MG tablet Take 81 mg by mouth daily.   . cetirizine (ZYRTEC) 10 MG tablet Take 10 mg by mouth at bedtime.   . clopidogrel (PLAVIX) 75 MG  tablet Take 1 tablet by mouth once daily  . diltiazem (CARDIZEM CD) 240 MG 24 hr capsule Take 1 capsule (240 mg total) by mouth daily.  Marland Kitchen ezetimibe (ZETIA) 10 MG tablet Take 1 tablet (10 mg total) by mouth every evening.  Marland Kitchen levothyroxine (SYNTHROID) 25 MCG tablet TAKE 1 TABLET BY MOUTH ONCE DAILY BEFORE BREAKFAST  . nitroGLYCERIN (NITROSTAT) 0.4 MG SL tablet Place 1 tablet (0.4 mg total) under the tongue every 5 (five) minutes as needed for chest pain.  . pantoprazole (PROTONIX) 40 MG tablet Take 1 tablet by mouth once daily  . rosuvastatin (CRESTOR) 40 MG tablet Take 1 tablet (40 mg total) by mouth daily.  . tadalafil (CIALIS) 20 MG tablet 1 tab 1 hour prior to intercourse  . VITAMIN D, CHOLECALCIFEROL, PO Take by mouth daily.   No facility-administered medications prior to visit.    Review of Systems     Objective    There were no vitals taken for this visit.    Physical Exam     Assessment & Plan     1. Viral URI Patient with cold.  Not COVID.  He will call back if he worsens.  2. Cough Patient will try prednisone 20 mg daily for 5 days and also benzonatate Perles.  We will try  whiskey with honey at bedtime for nighttime cough.  He is agreeable with the plan.   No follow-ups on  file.     I discussed the assessment and treatment plan with the patient. The patient was provided an opportunity to ask questions and all were answered. The patient agreed with the plan and demonstrated an understanding of the instructions.   The patient was advised to call back or seek an in-person evaluation if the symptoms worsen or if the condition fails to improve as anticipated.  I provided 11  minutes of non-face-to-face time during this encounter.  I, Wilhemena Durie, MD, have reviewed all documentation for this visit. The documentation on 04/03/21 for the exam, diagnosis, procedures, and orders are all accurate and complete.   Jmichael Gille Cranford Mon, MD [facility] [phone] (phone) [phone] (fax)  [place]

## 2021-04-30 ENCOUNTER — Telehealth: Payer: Self-pay | Admitting: Cardiovascular Disease

## 2021-04-30 NOTE — Telephone Encounter (Signed)
Received records request from [facility] , forwarded to [facility] for processing via fax.

## 2021-05-08 ENCOUNTER — Other Ambulatory Visit: Payer: Self-pay

## 2021-05-08 ENCOUNTER — Ambulatory Visit: Payer: Medicare Other | Admitting: Dermatology

## 2021-05-08 DIAGNOSIS — L820 Inflamed seborrheic keratosis: Secondary | ICD-10-CM | POA: Diagnosis not present

## 2021-05-08 DIAGNOSIS — L578 Other skin changes due to chronic exposure to nonionizing radiation: Secondary | ICD-10-CM

## 2021-05-08 DIAGNOSIS — Z85820 Personal history of malignant melanoma of skin: Secondary | ICD-10-CM

## 2021-05-08 DIAGNOSIS — Z1283 Encounter for screening for malignant neoplasm of skin: Secondary | ICD-10-CM | POA: Diagnosis not present

## 2021-05-08 DIAGNOSIS — L821 Other seborrheic keratosis: Secondary | ICD-10-CM

## 2021-05-08 DIAGNOSIS — L719 Rosacea, unspecified: Secondary | ICD-10-CM | POA: Diagnosis not present

## 2021-05-08 DIAGNOSIS — D229 Melanocytic nevi, unspecified: Secondary | ICD-10-CM

## 2021-05-08 DIAGNOSIS — L814 Other melanin hyperpigmentation: Secondary | ICD-10-CM

## 2021-05-08 DIAGNOSIS — L570 Actinic keratosis: Secondary | ICD-10-CM

## 2021-05-08 DIAGNOSIS — D1800 Hemangioma unspecified site: Secondary | ICD-10-CM

## 2021-05-08 DIAGNOSIS — L918 Other hypertrophic disorders of the skin: Secondary | ICD-10-CM

## 2021-05-08 NOTE — Progress Notes (Signed)
Follow-Up Visit   Subjective  [name] [name] is a 70 y.o. male who presents for the following: Annual Exam (Hx MM L shoulder - 1984). The patient presents for Total-Body Skin Exam (TBSE) for skin cancer screening and mole check.  The following portions of the chart were reviewed this encounter and updated as appropriate:   Tobacco  Allergies  Meds  Problems  Med Hx  Surg Hx  Fam Hx     Review of Systems:  No other skin or systemic complaints except as noted in HPI or Assessment and Plan.  Objective  Well appearing patient in no apparent distress; mood and affect are within normal limits.  A full examination was performed including scalp, head, eyes, ears, nose, lips, neck, chest, axillae, abdomen, back, buttocks, bilateral upper extremities, bilateral lower extremities, hands, feet, fingers, toes, fingernails, and toenails. All findings within normal limits unless otherwise noted below.  Objective  Face: Erythema of the face  Objective  Scalp x 2, B/L forearm x 2, R pretibial x 1, R shoulder x 1 (6): Erythematous keratotic or waxy stuck-on papule or plaque.   Objective  R cheek x 1: Erythematous thin papules/macules with gritty scale.   Assessment & Plan  Rosacea Face With telangiectasias - Rosacea is a chronic progressive skin condition usually affecting the face of adults, causing redness and/or acne bumps. It is treatable but not curable. It sometimes affects the eyes (ocular rosacea) as well. It may respond to topical and/or systemic medication and can flare with stress, sun exposure, alcohol, exercise and some foods.  Daily application of broad spectrum spf 30+ sunscreen to face is recommended to reduce flares.  Discussed BBL/laser treatment.  Inflamed seborrheic keratosis (6) Scalp x 2, B/L forearm x 2, R pretibial x 1, R shoulder x 1  Destruction of lesion - Scalp x 2, B/L forearm x 2, R pretibial x 1, R shoulder x 1 Complexity: simple   Destruction method:  cryotherapy   Informed consent: discussed and consent obtained   Timeout:  patient name, date of birth, surgical site, and procedure verified Lesion destroyed using liquid nitrogen: Yes   Region frozen until ice ball extended beyond lesion: Yes   Outcome: patient tolerated procedure well with no complications   Post-procedure details: wound care instructions given    AK (actinic keratosis) R cheek x 1  Destruction of lesion - R cheek x 1 Complexity: simple   Destruction method: cryotherapy   Informed consent: discussed and consent obtained   Timeout:  patient name, date of birth, surgical site, and procedure verified Lesion destroyed using liquid nitrogen: Yes   Region frozen until ice ball extended beyond lesion: Yes   Outcome: patient tolerated procedure well with no complications   Post-procedure details: wound care instructions given    Skin cancer screening   Lentigines - Scattered tan macules - Due to sun exposure - Benign-appering, observe - Recommend daily broad spectrum sunscreen SPF 30+ to sun-exposed areas, reapply every 2 hours as needed. - Call for any changes  Seborrheic Keratoses - Stuck-on, waxy, tan-brown papules and/or plaques  - Benign-appearing - Discussed benign etiology and prognosis. - Observe - Call for any changes  Melanocytic Nevi - Tan-brown and/or pink-flesh-colored symmetric macules and papules - Benign appearing on exam today - Observation - Call clinic for new or changing moles - Recommend daily use of broad spectrum spf 30+ sunscreen to sun-exposed areas.   Hemangiomas - Red papules - Discussed benign nature - Observe - Call  for any changes  Actinic Damage - Chronic condition, secondary to cumulative UV/sun exposure - diffuse scaly erythematous macules with underlying dyspigmentation - Recommend daily broad spectrum sunscreen SPF 30+ to sun-exposed areas, reapply every 2 hours as needed.  - Staying in the shade or wearing long  sleeves, sun glasses (UVA+UVB protection) and wide brim hats (4-inch brim around the entire circumference of the hat) are also recommended for sun protection.  - Call for new or changing lesions.  History of Melanoma - No evidence of recurrence today - No lymphadenopathy - Recommend regular full body skin exams - Recommend daily broad spectrum sunscreen SPF 30+ to sun-exposed areas, reapply every 2 hours as needed.  - Call if any new or changing lesions are noted between office visits  Acrochordons (Skin Tags) - Fleshy, skin-colored pedunculated papules - Benign appearing.  - Observe. - If desired, they can be removed with an in office procedure that is not covered by insurance. - Please call the clinic if you notice any new or changing lesions.  Skin cancer screening performed today.  Return in about 1 year (around 05/08/2022) for TBSE - Hx MM .  Luther Redo, CMA, am acting as scribe for Sarina Ser, MD .  Documentation: I have reviewed the above documentation for accuracy and completeness, and I agree with the above.  Sarina Ser, MD

## 2021-05-08 NOTE — Patient Instructions (Signed)

## 2021-05-09 ENCOUNTER — Encounter: Payer: Self-pay | Admitting: Dermatology

## 2021-06-29 ENCOUNTER — Other Ambulatory Visit: Payer: Self-pay | Admitting: Family Medicine

## 2021-06-29 DIAGNOSIS — E039 Hypothyroidism, unspecified: Secondary | ICD-10-CM

## 2021-06-29 NOTE — Telephone Encounter (Signed)
Requested Prescriptions  Pending Prescriptions Disp Refills  . levothyroxine (SYNTHROID) 25 MCG tablet [Pharmacy Med Name: Levothyroxine Sodium 25 MCG Oral Tablet] 60 tablet 0    Sig: TAKE 1 TABLET BY MOUTH ONCE DAILY BEFORE BREAKFAST     Endocrinology:  Hypothyroid Agents Failed - 06/29/2021  1:51 PM      Failed - TSH needs to be rechecked within 3 months after an abnormal result. Refill until TSH is due.      Passed - TSH in normal range and within 360 days    TSH  Date Value Ref Range Status  08/28/2020 3.580 0.450 - 4.500 uIU/mL Final         Passed - Valid encounter within last 12 months    Recent Outpatient Visits          2 months ago Viral URI   [facility] Jerrol Banana., MD   4 months ago Closed nondisplaced fracture of right calcaneus with routine healing, unspecified portion of calcaneus, subsequent encounter   [place], [place], PA-C   5 months ago Osteoarthritis of left knee, unspecified osteoarthritis type   [place], [place]   9 months ago Primary osteoarthritis of left knee   [place], [place]   10 months ago Hyperlipidemia LDL goal <70   [facility] [place], Wendee Beavers, [place]      Future Appointments            In 2 weeks Dunn, Areta Haber, PA-C [facility]   In 10 months Ralene Bathe, MD [place]

## 2021-06-30 ENCOUNTER — Telehealth: Payer: Self-pay | Admitting: Cardiovascular Disease

## 2021-06-30 NOTE — Telephone Encounter (Signed)
Received records request from [place], [place] forwarded to [employer]

## 2021-07-01 NOTE — Telephone Encounter (Signed)
Follow up:      EIS calling to check the status of this patient forms. Please fax to [phone] and you need to speak some one call is also the phone number of the fax.

## 2021-07-03 NOTE — Telephone Encounter (Signed)
Information request sent to Medical Records.

## 2021-07-10 NOTE — Progress Notes (Signed)
Cardiology Office Note    Date:  07/16/2021   ID:  [name] [name], DOB 04-06-1951, MRN [id number]  PCP:  [name] Post, PA-C  Cardiologist:  [name] Sacramento, MD  Electrophysiologist:  None   Chief Complaint: Follow-up  History of Present Illness:   [name] [name] is a 70 y.o. male with history of CAD s/p PCI/DES to the LAD in 06/2019, PAF diagnosed in 2004 not on [place] in the setting of CHADS2VASc of 1, paroxysmal SVT, mild aortic valve regurgitation, diastolic dysfunction, hypothyroidism on Synthroid, severe possibly familial hyperlipidemia, elevated liver function, and strong family history of premature coronary artery disease on his father's side who presents for follow up of CAD and A. fib.   He was previously managed by Dr. Dion Saucier in [place], [place].  He was initially diagnosed with A. fib in 2004 and was managed with metoprolol though this was discontinued secondary to nightmares.  Since then, he has been managed with diltiazem.  Prior stress echo in 2013 was unremarkable.  Holter monitor in 12/2014 showed the predominant rhythm was sinus with an average heart rate of 71 bpm, minimum heart rate 45 bpm, maximum heart rate 118 bpm, rare isolated PVCs, frequent isolated PACs totaling 4587, 6 atrial couplets, 3 runs of SVT with the longest lasting 7 beats with a maximum rate of 141 bpm, no significant pauses.  No evidence of A. Fib.  He was seen in the office in 02/2018 and reported tolerating high-dose Crestor.  He underwent treadmill stress test on 03/16/2018 that showed no evidence of ischemia with good exercise capacity with an exercise duration of 7 minutes and 48 seconds.  He was evaluated in 04/2019 for palpitations and chest pain.  Echo showed an EF of 55-60%, mild concentric LVH, normal diastolic function, normal RVSF, grossly normal mitral and tricuspid valves, mild AI.  Subsequent Zio in 05/2019 showed NSR with an average heart rate of 73 bpm with 27 episodes of short SVT with the longest episode  lasting 13 beats.  He underwent coronary CTA in 06/2019, which showed left main FFR 0.98, ostial LAD FFR 0.98, proximal LAD FFR 0.50, proximal LCx FFR 0.95, mid LCx FFR 0.78, OM1 FFR 0.77, proximal RCA FFR 0.98, mid RCA FFR 0.82, PDA FFR 0.79. Given this, he underwent diagnostic LHC on 06/26/2019 that showed severe single-vessel CAD with 80-90% proximal LAD stenosis with heavy calcification as well as mild to moderate nonobstructive CAD involving the mid LAD, mid LCx, and mid RCA with elevated LVEDP and normal LV contraction consistent with diastolic dysfunction. Given the heavy calcification of the LAD, it was recommended he undergo staged PCI with atherectomy. He underwent successful complex orbital atherectomy and 2 overlapping DES to the proximal and mid LAD extending into the ostium with balloon angioplasty of the ostial D2. Procedure was complicated by a non-flow limiting dissection in the ostial diagonal with normal flow that did not require treatment.   He was last seen in the office in 12/2020 and was doing well from a cardiac perspective.  Given overlapping stents in the proximal and ostial LAD it was recommended he remain on DAPT as long as tolerated.  He comes in continuing to do well from a cardiac perspective.  No chest pain, dyspnea, dizziness, presyncope, syncope, lower extremity swelling, falls, hematochezia, or melena.  He does note an improvement in his overall palpitation burden following prior titration of Cardizem CD to 240 mg daily.  He does not have any issues or concerns at this time.  Labs independently reviewed: 08/2020 - Hgb 13.8, PLT 151, TC 135, TG 96, HDL 46, LDL 71, TSH normal, BUN 20, serum creatinine 0.82, potassium 4.0, albumin 4.3, AST/ALT normal  Past Medical History:  Diagnosis Date   Acquired hypothyroidism    Coronary artery disease    a. cath 06/2019 s/p  orbital atherectomy and 2 overlapped DES to the mid and proximal LAD; angiopasty to 2nd dig   ED (erectile  dysfunction)    Melanoma ([place])    Paroxysmal atrial fibrillation ([place])    Diagnosed in 2004   Paroxysmal SVT (supraventricular tachycardia) ([employer])    Pure hypercholesterolemia     Past Surgical History:  Procedure Laterality Date   CORONARY ATHERECTOMY N/A 07/05/2019   Procedure: CORONARY ATHERECTOMY;  Surgeon: [name] Hampshire, MD;  Location: [facility];  Service: Cardiovascular;  Laterality: N/A;   CORONARY BALLOON ANGIOPLASTY N/A 07/05/2019   Procedure: CORONARY BALLOON ANGIOPLASTY;  Surgeon: [name] Hampshire, MD;  Location: [facility];  Service: Cardiovascular;  Laterality: N/A;   CORONARY STENT INTERVENTION N/A 07/05/2019   Procedure: CORONARY STENT INTERVENTION;  Surgeon: [name] Hampshire, MD;  Location: [facility];  Service: Cardiovascular;  Laterality: N/A;   LEFT HEART CATH AND CORONARY ANGIOGRAPHY Left 06/26/2019   Procedure: LEFT HEART CATH AND CORONARY ANGIOGRAPHY;  Surgeon: [name] Bush, MD;  Location: [facility];  Service: Cardiovascular;  Laterality: Left;   MELANOMA EXCISION  02/17/1983   NECK SURGERY     XI ROBOTIC ASSISTED INGUINAL HERNIA REPAIR WITH MESH Bilateral 04/18/2020   Procedure: XI ROBOTIC ASSISTED INGUINAL HERNIA REPAIR WITH MESH;  Surgeon: [name] Husbands, MD;  Location: [facility];  Service: General;  Laterality: Bilateral;    Current Medications: Current Meds  Medication Sig   acetaminophen (TYLENOL) 500 MG tablet Take 500 mg by mouth every 6 (six) hours as needed.   aspirin EC 81 MG tablet Take 81 mg by mouth daily.    cetirizine (ZYRTEC) 10 MG tablet Take 10 mg by mouth at bedtime.    clopidogrel (PLAVIX) 75 MG tablet Take 1 tablet by mouth once daily   diltiazem (CARDIZEM CD) 240 MG 24 hr capsule Take 1 capsule (240 mg total) by mouth daily.   ezetimibe (ZETIA) 10 MG tablet Take 1 tablet (10 mg total) by mouth every evening.   levothyroxine (SYNTHROID) 25 MCG tablet TAKE 1 TABLET BY MOUTH ONCE DAILY BEFORE BREAKFAST    nitroGLYCERIN (NITROSTAT) 0.4 MG SL tablet Place 1 tablet (0.4 mg total) under the tongue every 5 (five) minutes as needed for chest pain.   pantoprazole (PROTONIX) 40 MG tablet Take 1 tablet by mouth once daily   rosuvastatin (CRESTOR) 40 MG tablet Take 1 tablet (40 mg total) by mouth daily.   tadalafil (CIALIS) 20 MG tablet 1 tab 1 hour prior to intercourse   VITAMIN D, CHOLECALCIFEROL, PO Take by mouth daily.    Allergies:   Hydrocodone-acetaminophen and Vicodin [hydrocodone-acetaminophen]   Social History   Socioeconomic History   Marital status: Married    Spouse name: Morey Hummingbird    Number of children: 2   Years of education: Not on file   Highest education level: Not on file  Occupational History   Not on file  Tobacco Use   Smoking status: Never   Smokeless tobacco: Never  Vaping Use   Vaping Use: Never used  Substance and Sexual Activity   Alcohol use: Yes    Comment: Occ.  Drug use: No   Sexual activity: Yes    Birth control/protection: None  Other Topics Concern   Not on file  Social History Narrative   Not on file   Social Determinants of Health   Financial Resource Strain: Not on file  Food Insecurity: Not on file  Transportation Needs: Not on file  Physical Activity: Not on file  Stress: Not on file  Social Connections: Not on file     Family History:  The patient's family history includes Atrial fibrillation in his mother; Heart disease in his father; Stroke in his father.  ROS:   Review of Systems  Constitutional:  Negative for chills, diaphoresis, fever, malaise/fatigue and weight loss.  HENT:  Negative for congestion.   Eyes:  Negative for discharge and redness.  Respiratory:  Negative for cough, sputum production, shortness of breath and wheezing.   Cardiovascular:  Negative for chest pain, palpitations, orthopnea, claudication, leg swelling and PND.  Gastrointestinal:  Negative for abdominal pain, blood in stool, heartburn, melena, nausea and  vomiting.  Musculoskeletal:  Negative for falls and myalgias.  Skin:  Negative for rash.  Neurological:  Negative for dizziness, tingling, tremors, sensory change, speech change, focal weakness, loss of consciousness and weakness.  Endo/Heme/Allergies:  Does not bruise/bleed easily.  Psychiatric/Behavioral:  Negative for substance abuse. The patient is not nervous/anxious.   All other systems reviewed and are negative.   EKGs/Labs/Other Studies Reviewed:    Studies reviewed were summarized above. The additional studies were reviewed today:  2D Echo 04/2018: 1. The left ventricle has normal systolic function, with an ejection fraction of 55-60%. The cavity size was normal. There is mild concentric left ventricular hypertrophy. Left ventricular diastolic parameters were normal.  2. The right ventricle has normal systolic function. The cavity was normal. There is no increase in right ventricular wall thickness.  3. The mitral valve is grossly normal.  4. The tricuspid valve is grossly normal.  5. The aortic valve has an indeterminate number of cusps. Aortic valve regurgitation is mild by color flow Doppler. __________   CTA chest/abd/pelvis: 1. No dissection.  No PE.  No aortic aneurysm. 2. Atherosclerotic changes are noted throughout the thoracic and abdominal aorta. 3. No acute intra-abdominal abnormality. 4. Severe sigmoid diverticulosis without CT evidence of diverticulitis. 5. Enlarged prostate gland. Bilateral fat containing inguinal hernias are noted.   Zio 05/2019: Normal sinus rhythm with an average heart rate of 73 bpm. 27 episodes of short SVTs.  The longest lasted 13 beats. __________   Coronary CTA/FFR: 1. Left Main: 0.98. 2. LAD: Ostial: 0.98, proximal: 0.50. 3. LCX: Proximal: 0.95, mid: 078. 4. OM1: 0.77. 5. RCA: Proximal: 0.98, mid: 0.82, PDA: 0.79.   IMPRESSION: 1. CT FFR analysis showed severe stenosis in the proximal LAD, mid RCA, mid LCX artery. A cardiac  catheterization is recommended. __________   [facility] 06/2019: Conclusions: Severe single-vessel CAD with 80-90% proximal LAD stenosis with heavy calcification. Mild to moderate, non-obstructive CAD involving mid LAD, mid LCx, and mid RCA. Elevated LVEDP with normal left ventricular contraction consistent with diastolic dysfunction.   Recommendations: Plan for staged PCI to proximal LAD at [facility] using atherectomy.  Will start patient on dual antiplatelet therapy with aspirin and clopidogrel.  Long-term, transitioning Mr. Bilton to NOAC + clopidogrel will need to be considered with his history of paroxysmal atrial fibrillation and CHADSVASC score of at least 2 (age + CAD). Aggressive secondary prevention. __________   PCI 06/2019: Mid LAD lesion is 55% stenosed.  Prox Cx to Mid Cx lesion is 40% stenosed. Mid RCA lesion is 30% stenosed. Prox LAD to Mid LAD lesion is 85% stenosed. 2nd Diag lesion is 50% stenosed. [name] intervention, there is a 0% residual stenosis. A drug-eluting stent was successfully placed using a STENT RESOLUTE [id number]. [name] intervention, there is a 0% residual stenosis. A drug-eluting stent was successfully placed using a STENT RESOLUTE ONYX 3.0X8. [name] intervention, there is a 30% residual stenosis. Balloon angioplasty was performed using a BALLOON SAPPHIRE 2.5X20.   Successful complex orbital atherectomy and 2 overlapped drug-eluting stent placement to the mid and proximal LAD extending into the ostium with balloon angioplasty of the ostial second diagonal.  There was non-flow-limiting dissection in ostial diagonal with normal flow.  This did not require treatment.   Recommendations: Dual antiplatelet therapy for at least 1 year.  Aggressive treatment of risk factors.   EKG:  EKG is ordered today.  The EKG ordered today demonstrates NSR, 64 bpm, no acute ST-T changes  Recent Labs: 08/28/2020: ALT 38; BUN 20; Creatinine, Ser 0.82; Hemoglobin 13.8; Platelets 151;  Potassium 4.0; Sodium 139; TSH 3.580  Recent Lipid Panel    Component Value Date/Time   CHOL 135 08/28/2020 0925   TRIG 96 08/28/2020 0925   HDL 46 08/28/2020 0925   CHOLHDL 2.9 08/28/2020 0925   CHOLHDL 3.2 05/11/2018 0845   VLDL 26 05/11/2018 0845   LDLCALC 71 08/28/2020 0925    PHYSICAL EXAM:    VS:  BP 130/60 (BP Location: Left Arm, Patient Position: Sitting, Cuff Size: Normal)   Pulse 64   Ht 6' (1.829 m)   Wt 207 lb 6 oz (94.1 kg)   SpO2 98%   BMI 28.13 kg/m   BMI: Body mass index is 28.13 kg/m.  Physical Exam Vitals reviewed.  Constitutional:      Appearance: He is well-developed.  HENT:     Head: Normocephalic and atraumatic.  Eyes:     General:        Right eye: No discharge.        Left eye: No discharge.  Neck:     Vascular: No JVD.  Cardiovascular:     Rate and Rhythm: Normal rate and regular rhythm.     Pulses:          Posterior tibial pulses are 2+ on the right side and 2+ on the left side.     Heart sounds: Normal heart sounds, S1 normal and S2 normal. Heart sounds not distant. No midsystolic click and no opening snap. No murmur heard.   No friction rub.  Pulmonary:     Effort: Pulmonary effort is normal. No respiratory distress.     Breath sounds: Normal breath sounds. No decreased breath sounds, wheezing or rales.  Chest:     Chest wall: No tenderness.  Abdominal:     General: There is no distension.     Palpations: Abdomen is soft.     Tenderness: There is no abdominal tenderness.  Musculoskeletal:     Cervical back: Normal range of motion.     Right lower leg: No edema.     Left lower leg: No edema.  Skin:    General: Skin is warm and dry.     Nails: There is no clubbing.  Neurological:     Mental Status: He is alert and oriented to person, place, and time.  Psychiatric:        Speech: Speech normal.  Behavior: Behavior normal.        Thought Content: Thought content normal.        Judgment: Judgment normal.    Wt Readings  from Last 3 Encounters:  07/16/21 207 lb 6 oz (94.1 kg)  02/04/21 201 lb 9.6 oz (91.4 kg)  01/22/21 204 lb (92.5 kg)     ASSESSMENT & PLAN:   CAD involving the native coronary arteries without angina: He continues to do well without symptoms concerning for angina.  Interventional cardiology recommends he remain on DAPT as long as tolerated given overlapping stents in the proximal and ostial LAD.  Given this, he remains on aspirin and clopidogrel.  No indication for further ischemic testing at this time.  Place future orders for CBC given DAPT use.  PAF: No recent documented active A. fib.  She will be redevelop A. fib, long-term OAC would need to be considered at that time.  Paroxysmal SVT: Palpitation burden continues to be well controlled to improved.  Given this, he declines repeat outpatient cardiac monitoring at this time.  Continue Cardizem CD.  Intolerant to beta-blockers secondary to nightmares.  Diastolic dysfunction: Blood pressure is reasonably controlled in the office today.  No evidence of volume overload.  HLD with familial hyperlipidemia: LDL 71 in 08/2020.  He remains on rosuvastatin and ezetimibe.  Place future orders for CMP and lipid panel.  Disposition: F/u with Dr. Fletcher Anon or an APP in 12 months, sooner if needed.   Medication Adjustments/Labs and Tests Ordered: Current medicines are reviewed at length with the patient today.  Concerns regarding medicines are outlined above. Medication changes, Labs and Tests ordered today are summarized above and listed in the Patient Instructions accessible in Encounters.   Signed, Christell Faith, PA-C 07/16/2021 11:01 AM     [address] [place], [place] [address] [phone]

## 2021-07-16 ENCOUNTER — Ambulatory Visit: Payer: Medicare Other | Admitting: Physician Assistant

## 2021-07-16 ENCOUNTER — Encounter: Payer: Self-pay | Admitting: Physician Assistant

## 2021-07-16 ENCOUNTER — Other Ambulatory Visit: Payer: Self-pay

## 2021-07-16 VITALS — BP 130/60 | HR 64 | Ht 72.0 in | Wt 207.4 lb

## 2021-07-16 DIAGNOSIS — E7849 Other hyperlipidemia: Secondary | ICD-10-CM | POA: Diagnosis not present

## 2021-07-16 DIAGNOSIS — I2510 Atherosclerotic heart disease of native coronary artery without angina pectoris: Secondary | ICD-10-CM | POA: Diagnosis not present

## 2021-07-16 DIAGNOSIS — I471 Supraventricular tachycardia: Secondary | ICD-10-CM | POA: Diagnosis not present

## 2021-07-16 DIAGNOSIS — I480 Paroxysmal atrial fibrillation: Secondary | ICD-10-CM | POA: Diagnosis not present

## 2021-07-16 DIAGNOSIS — E785 Hyperlipidemia, unspecified: Secondary | ICD-10-CM | POA: Diagnosis not present

## 2021-07-16 NOTE — Patient Instructions (Signed)
Medication Instructions:  No changes at this time.  *If you need a refill on your cardiac medications before your next appointment, please call your pharmacy*   Lab Work: We would like you to have fasting labs (CMET, Lipid panel, and CBC). No appointment is needed for this. Go to [facility] Entrance at [facility] then go to 1st desk on the right to check in, past the screening table. Lab hours: Monday- Friday (7:30 am- 5:30 pm).  Please do not eat or drink anything after midnight before except sip of water with your medications.   If you have labs (blood work) drawn today and your tests are completely normal, you will receive your results only by: [place] (if you have MyChart) OR A paper copy in the mail If you have any lab test that is abnormal or we need to change your treatment, we will call you to review the results.   Testing/Procedures: None   Follow-Up: At [facility], you and your health needs are our priority.  As part of our continuing mission to provide you with exceptional heart care, we have created designated Provider Care Teams.  These Care Teams include your primary Cardiologist (physician) and Advanced Practice Providers (APPs -  Physician Assistants and Nurse Practitioners) who all work together to provide you with the care you need, when you need it.   Your next appointment:   1 year(s)  The format for your next appointment:   In Person  Provider:   You may see Kathlyn Sacramento, MD or one of the following Advanced Practice Providers on your designated Care Team:   Murray Hodgkins, NP Christell Faith, PA-C Marrianne Mood, PA-C [place], [place]

## 2021-07-20 ENCOUNTER — Encounter: Payer: Self-pay | Admitting: Physician Assistant

## 2021-07-20 ENCOUNTER — Telehealth: Payer: Medicare Other | Admitting: Physician Assistant

## 2021-07-20 DIAGNOSIS — U071 COVID-19: Secondary | ICD-10-CM

## 2021-07-20 DIAGNOSIS — R059 Cough, unspecified: Secondary | ICD-10-CM | POA: Diagnosis not present

## 2021-07-20 MED ORDER — MOLNUPIRAVIR EUA 200MG CAPSULE
4.0000 | ORAL_CAPSULE | Freq: Two times a day (BID) | ORAL | 0 refills | Status: AC
  Filled 2021-07-20: qty 40, 5d supply, fill #0

## 2021-07-20 MED ORDER — BENZONATATE 100 MG PO CAPS
100.0000 mg | ORAL_CAPSULE | Freq: Three times a day (TID) | ORAL | 0 refills | Status: DC | PRN
  Filled 2021-07-20: qty 20, 7d supply, fill #0

## 2021-07-20 NOTE — Progress Notes (Signed)
Based on what you shared with me, I feel your condition warrants further evaluation and I recommend that you be seen in a face to face visit.   NOTE: There will be NO CHARGE for this eVisit  VISIT WAS FOR COVID 19 ANTIVIRAL MEDS-I HAVE CONNECTED AND COMPLETED A VIDEO VISIT WITH THE PATIENT   If you are having a true medical emergency please call 911.      For an urgent face to face visit, [facility] has six urgent care centers for your convenience:     [facility] [place] [facility] [place] Get Driving Directions [id number] [address], [place] [address]    [facility]) Get Driving Directions [id number] [place], [place] [address]  [facility] [place] ([place]) Get Driving Directions [id number] [address] [place],  [place]  [id number]  [facility] Get Driving Directions [id number] [address] [place] [place], [place], [place] [address]   [facility] Get Driving Directions  [id number] [address], [place] [address]   [facility] [place] Get Driving Directions [id number] [address] [place],  [address] I spent 5-10 minutes on review and completion of this note- L[facility]

## 2021-07-20 NOTE — Progress Notes (Signed)
Acute Office Visit  Subjective:    Patient ID: [name] [name], male    DOB: 07/18/1951, 70 y.o.   MRN: [id number]  No chief complaint on file.  Virtual Visit via Video Note  I connected with [name] [name] on 07/20/21 at  6:30 PM EDT by a video enabled telemedicine application and verified that I am speaking with the correct person using two identifiers.  Location: Patient: Patient's home Provider: Provider's office  Person participating in the virtual visit: Patient and provider    I discussed the limitations of evaluation and management by telemedicine and the availability of in person appointments. The patient expressed understanding and agreed to proceed.  I discussed the assessment and treatment plan with the patient. The patient was provided an opportunity to ask questions and all were answered. The patient agreed with the plan and demonstrated an understanding of the instructions.   The patient was advised to call back or seek an in-person evaluation if the symptoms worsen or if the condition fails to improve as anticipated.  I provided 15 minutes of non-face-to-face time during this encounter.   Waldon Merl, PA-C  70 yo M in [facility] connect via video inquires about Covid 19 antiviral tx. Tested pos for covid 19 today. Symptoms of fever and cough started yesterday. Has been taking Tylenol for fever. Highest temp 102 F today. With PMH of Afib, HLD on statins.   Other Associated symptoms include coughing and a fever. Pertinent negatives include no abdominal pain, chest pain, chills, congestion, diaphoresis, fatigue, myalgias, nausea, rash, sore throat, vomiting or weakness.  Patient is in today for covid meds  Past Medical History:  Diagnosis Date   Acquired hypothyroidism    Coronary artery disease    a. cath 06/2019 s/p  orbital atherectomy and 2 overlapped DES to the mid and proximal LAD; angiopasty to 2nd dig   ED (erectile dysfunction)    Melanoma ([place])     Paroxysmal atrial fibrillation ([place])    Diagnosed in 2004   Paroxysmal SVT (supraventricular tachycardia) ([place])    Pure hypercholesterolemia     Past Surgical History:  Procedure Laterality Date   CORONARY ATHERECTOMY N/A 07/05/2019   Procedure: CORONARY ATHERECTOMY;  Surgeon: Wellington Hampshire, MD;  Location: [facility];  Service: Cardiovascular;  Laterality: N/A;   CORONARY BALLOON ANGIOPLASTY N/A 07/05/2019   Procedure: CORONARY BALLOON ANGIOPLASTY;  Surgeon: Wellington Hampshire, MD;  Location: [facility];  Service: Cardiovascular;  Laterality: N/A;   CORONARY STENT INTERVENTION N/A 07/05/2019   Procedure: CORONARY STENT INTERVENTION;  Surgeon: Wellington Hampshire, MD;  Location: [facility];  Service: Cardiovascular;  Laterality: N/A;   LEFT HEART CATH AND CORONARY ANGIOGRAPHY Left 06/26/2019   Procedure: LEFT HEART CATH AND CORONARY ANGIOGRAPHY;  Surgeon: Nelva Bush, MD;  Location: [facility];  Service: Cardiovascular;  Laterality: Left;   MELANOMA EXCISION  02/17/1983   NECK SURGERY     XI ROBOTIC ASSISTED INGUINAL HERNIA REPAIR WITH MESH Bilateral 04/18/2020   Procedure: XI ROBOTIC ASSISTED INGUINAL HERNIA REPAIR WITH MESH;  Surgeon: Jules Husbands, MD;  Location: [facility];  Service: General;  Laterality: Bilateral;    Family History  Problem Relation Age of Onset   Atrial fibrillation Mother    Stroke Father    Heart disease Father     Social History   Socioeconomic History   Marital status: Married    Spouse name: Morey Hummingbird    Number of children:  2   Years of education: Not on file   Highest education level: Not on file  Occupational History   Not on file  Tobacco Use   Smoking status: Never   Smokeless tobacco: Never  Vaping Use   Vaping Use: Never used  Substance and Sexual Activity   Alcohol use: Yes    Comment: Occ.    Drug use: No   Sexual activity: Yes    Birth control/protection: None  Other Topics Concern   Not on file   Social History Narrative   Not on file   Social Determinants of Health   Financial Resource Strain: Not on file  Food Insecurity: Not on file  Transportation Needs: Not on file  Physical Activity: Not on file  Stress: Not on file  Social Connections: Not on file  Intimate Partner Violence: Not on file    Outpatient Medications Prior to Visit  Medication Sig Dispense Refill   acetaminophen (TYLENOL) 500 MG tablet Take 500 mg by mouth every 6 (six) hours as needed.     aspirin EC 81 MG tablet Take 81 mg by mouth daily.      benzonatate (TESSALON) 200 MG capsule Take 1 capsule (200 mg total) by mouth 2 (two) times daily as needed for cough. (Patient not taking: Reported on 07/16/2021) 20 capsule 0   cetirizine (ZYRTEC) 10 MG tablet Take 10 mg by mouth at bedtime.      clopidogrel (PLAVIX) 75 MG tablet Take 1 tablet by mouth once daily 90 tablet 2   diltiazem (CARDIZEM CD) 240 MG 24 hr capsule Take 1 capsule (240 mg total) by mouth daily. 90 capsule 3   ezetimibe (ZETIA) 10 MG tablet Take 1 tablet (10 mg total) by mouth every evening. 90 tablet 3   levothyroxine (SYNTHROID) 25 MCG tablet TAKE 1 TABLET BY MOUTH ONCE DAILY BEFORE BREAKFAST 60 tablet 0   nitroGLYCERIN (NITROSTAT) 0.4 MG SL tablet Place 1 tablet (0.4 mg total) under the tongue every 5 (five) minutes as needed for chest pain. 25 tablet 3   pantoprazole (PROTONIX) 40 MG tablet Take 1 tablet by mouth once daily 90 tablet 0   predniSONE (DELTASONE) 20 MG tablet Take 1 tablet (20 mg total) by mouth daily with breakfast. (Patient not taking: Reported on 07/16/2021) 5 tablet 1   rosuvastatin (CRESTOR) 40 MG tablet Take 1 tablet (40 mg total) by mouth daily. 90 tablet 3   tadalafil (CIALIS) 20 MG tablet 1 tab 1 hour prior to intercourse 30 tablet 3   VITAMIN D, CHOLECALCIFEROL, PO Take by mouth daily.     No facility-administered medications prior to visit.    Allergies  Allergen Reactions   Hydrocodone-Acetaminophen Nausea And  Vomiting and Other (See Comments)   Vicodin [Hydrocodone-Acetaminophen] Nausea And Vomiting    Review of Systems  Constitutional:  Positive for fever. Negative for activity change, appetite change, chills, diaphoresis and fatigue.  HENT:  Negative for congestion, ear discharge, ear pain, postnasal drip, rhinorrhea, sinus pressure, sinus pain, sneezing, sore throat, tinnitus, trouble swallowing and voice change.   Respiratory:  Positive for cough. Negative for apnea, choking, chest tightness, shortness of breath, wheezing and stridor.   Cardiovascular:  Negative for chest pain, palpitations and leg swelling.  Gastrointestinal:  Negative for abdominal pain, diarrhea, nausea and vomiting.  Musculoskeletal:  Negative for myalgias.  Skin:  Negative for rash.  Neurological:  Negative for dizziness, weakness and light-headedness.  Hematological:  Negative for adenopathy.  Psychiatric/Behavioral:  Negative for  agitation, behavioral problems and confusion.       Objective:    Physical Exam Constitutional:      General: He is not in acute distress.    Appearance: Normal appearance. He is not ill-appearing, toxic-appearing or diaphoretic.  Pulmonary:     Effort: No respiratory distress.  Neurological:     Mental Status: He is alert and oriented to person, place, and time.  Psychiatric:        Mood and Affect: Mood normal.        Behavior: Behavior normal.        Thought Content: Thought content normal.        Judgment: Judgment normal.    There were no vitals taken for this visit. Wt Readings from Last 3 Encounters:  07/16/21 207 lb 6 oz (94.1 kg)  02/04/21 201 lb 9.6 oz (91.4 kg)  01/22/21 204 lb (92.5 kg)    Health Maintenance Due  Topic Date Due   Hepatitis C Screening  Never done   Zoster Vaccines- Shingrix (1 of 2) Never done   PNA vac Low Risk Adult (1 of 2 - PCV13) Never done   COLONOSCOPY (Pts 45-13yr Insurance coverage will need to be confirmed)  01/08/2018   INFLUENZA  VACCINE  07/07/2021    There are no preventive care reminders to display for this patient.   Lab Results  Component Value Date   TSH 3.580 08/28/2020   Lab Results  Component Value Date   WBC 3.3 (L) 08/28/2020   HGB 13.8 08/28/2020   HCT 41.5 08/28/2020   MCV 89 08/28/2020   PLT 151 08/28/2020   Lab Results  Component Value Date   NA 139 08/28/2020   K 4.0 08/28/2020   CO2 23 08/28/2020   GLUCOSE 112 (H) 08/28/2020   BUN 20 08/28/2020   CREATININE 0.82 08/28/2020   BILITOT 0.5 08/28/2020   ALKPHOS 67 08/28/2020   AST 32 08/28/2020   ALT 38 08/28/2020   PROT 6.6 08/28/2020   ALBUMIN 4.3 08/28/2020   CALCIUM 9.8 08/28/2020   ANIONGAP 9 04/16/2020   Lab Results  Component Value Date   CHOL 135 08/28/2020   Lab Results  Component Value Date   HDL 46 08/28/2020   Lab Results  Component Value Date   LDLCALC 71 08/28/2020   Lab Results  Component Value Date   TRIG 96 08/28/2020   Lab Results  Component Value Date   CHOLHDL 2.9 08/28/2020   No results found for: HGBA1C     Assessment & Plan:   Problem List Items Addressed This Visit   None Visit Diagnoses     Cough    -  Primary        Meds ordered this encounter  Medications   molnupiravir EUA 200 mg CAPS    Sig: Take 4 capsules (800 mg total) by mouth 2 (two) times daily for 5 days.    Dispense:  40 capsule    Refill:  0    Order Specific Question:   Supervising Provider    Answer:   MILLER, BRIAN [[id number]]   benzonatate (TESSALON) 100 MG capsule    Sig: Take 1 capsule (100 mg total) by mouth 3 (three) times daily as needed for cough.    Dispense:  20 capsule    Refill:  0    Order Specific Question:   Supervising Provider    Answer:   MSabra Heck BRIAN [[id number]]   Advised pt that he may use  Flonase for congestion as needed- states has it at home.   You are being prescribed MOLNUPIRAVIR for COVID-19 infection.   Please pick up your prescription at: [facility]  Address:  [facility], [address], [place], [place] [address]      Hours:  Monday - Friday 7:30 am - 5:30 pm.  Saturday Closed   Sunday Closed  Phone: [phone]  Please pick up your prescription at:  pharmacy called into [place]    Please call the pharmacy or go through the drive through vs going inside if you are picking up the mediation yourself to prevent further spread. If prescribed to a [facility] affiliated pharmacy, a pharmacist will bring the medication out to your car.   ADMINISTRATION INSTRUCTIONS: Take with or without food. Swallow the tablets whole. Don't chew, crush, or break the medications because it might not work as well  For each dose of the medication, you should be taking FOUR tablets at one time, TWICE a day   Finish your full five-day course of Molnupiravir even if you feel better before you're done. Stopping this medication too early can make it less effective to prevent severe illness related to [place].    Molnupiravir is prescribed for YOU ONLY. Don't share it with others, even if they have similar symptoms as you. This medication might not be right for everyone.   Make sure to take steps to protect yourself and others while you're taking this medication in order to get well soon and to prevent others from getting sick with COVID-19.   **If you are of childbearing potential (any gender) - it is advised to not get pregnant while taking this medication and recommended that condoms are used for male partners the next 3 months after taking the medication out of extreme caution    COMMON SIDE EFFECTS: Diarrhea Nausea  Dizziness    If your COVID-19 symptoms get worse, get medical help right away. Call 911 if you experience symptoms such as worsening cough, trouble breathing, chest pain that doesn't go away, confusion, a hard time staying awake, and pale or blue-colored skin. This medication won't prevent all COVID-19 cases from getting worse.     Waldon Merl, PA-C

## 2021-07-21 ENCOUNTER — Other Ambulatory Visit: Payer: Self-pay

## 2021-07-21 ENCOUNTER — Telehealth: Payer: Self-pay

## 2021-07-21 NOTE — Telephone Encounter (Signed)
Contacted patient to advise to contact [facility] for consideration of antiviral. LVMTCB   Positive: 07/20/21  Symptoms: Fever of 102, chest congestion, headache and body aches. Started on Thursday  Wife was + on Thursday  Fever started on 07/20/21

## 2021-07-21 NOTE — Telephone Encounter (Signed)
Contacted patient to advise to contact [facility] for consideration of antiviral. LVMTCB   Positive: 07/20/21  Symptoms: Fever of 102, chest congestion, headache and body aches. Started on Thursday  Wife was + on Thursday  Fever started on 07/20/21

## 2021-07-22 ENCOUNTER — Ambulatory Visit: Payer: Self-pay | Admitting: *Deleted

## 2021-07-22 NOTE — Telephone Encounter (Signed)
There are no drug interactions with antiviral and mucinex. Ok to take

## 2021-07-22 NOTE — Telephone Encounter (Signed)
Patient advised as below.  

## 2021-07-22 NOTE — Telephone Encounter (Signed)
Pt was covid positive on Sunday and has chest congestion.  Pt wants to know is this normal and should he be taking something for the congstion?  Pt is on anti viral medication-  Molnupiravir 800 mg Oral 2 times daily  Croupy cough and chest congestion.Patient is using Tessalon pearls- not helping much- patient is having trouble sleeping, Patient is on 3rd day of antiviral-he wants to know what OTC medication he can take- he was told to avoid Mucinex while taking treatment medication by virtual provider. Discussed using vapor rub on chest at night, increased fluids, warm tea, cough drops during the day. Reason for Disposition . [1] Caller has medicine question about med NOT prescribed by PCP AND [2] triager unable to answer question (e.g., compatibility with other med, storage)  Answer Assessment - Initial Assessment Questions 1. NAME of MEDICATION: "What medicine are you calling about?"     Patient would like to know what OTC medication is recommended for symptoms while on antiviral medication- Molnupiravir 800 mg Oral 2 times daily  2. QUESTION: "What is your question?" (e.g., double dose of medicine, side effect)     Wants to use OTC medication for symptoms 3. PRESCRIBING HCP: "Who prescribed it?" Reason: if prescribed by specialist, call should be referred to that group.     Out of office provider 4. SYMPTOMS: "Do you have any symptoms?"     Cough and chest congestion 5. SEVERITY: If symptoms are present, ask "Are they mild, moderate or severe?"     Trouble sleeping at night  Protocols used: Medication Question Call-A-AH

## 2021-08-19 ENCOUNTER — Telehealth: Payer: Self-pay | Admitting: Physician Assistant

## 2021-08-19 DIAGNOSIS — K219 Gastro-esophageal reflux disease without esophagitis: Secondary | ICD-10-CM

## 2021-08-19 MED ORDER — PANTOPRAZOLE SODIUM 40 MG PO TBEC: 40.0000 mg | DELAYED_RELEASE_TABLET | Freq: Every day | ORAL | 1 refills | Status: DC

## 2021-08-19 NOTE — Telephone Encounter (Signed)
[facility] faxed refill request for the following medications:   pantoprazole (PROTONIX) 40 MG tablet   Please advise.

## 2021-08-19 NOTE — Telephone Encounter (Signed)
LOV:03/2021 Last Refill:02/13/2021 Future appt. None

## 2021-08-27 ENCOUNTER — Other Ambulatory Visit: Payer: Self-pay | Admitting: Family Medicine

## 2021-08-27 DIAGNOSIS — E039 Hypothyroidism, unspecified: Secondary | ICD-10-CM

## 2021-09-23 ENCOUNTER — Other Ambulatory Visit: Payer: Self-pay | Admitting: Cardiovascular Disease

## 2021-09-23 ENCOUNTER — Encounter: Payer: Self-pay | Admitting: General Surgery

## 2021-09-28 ENCOUNTER — Other Ambulatory Visit: Payer: Self-pay | Admitting: Family Medicine

## 2021-09-28 DIAGNOSIS — E039 Hypothyroidism, unspecified: Secondary | ICD-10-CM

## 2021-11-12 ENCOUNTER — Ambulatory Visit (INDEPENDENT_AMBULATORY_CARE_PROVIDER_SITE_OTHER): Payer: Medicare Other | Admitting: Family Medicine

## 2021-11-12 ENCOUNTER — Encounter: Payer: Self-pay | Admitting: Family Medicine

## 2021-11-12 ENCOUNTER — Other Ambulatory Visit: Payer: Self-pay

## 2021-11-12 VITALS — BP 132/60 | HR 67 | Resp 16 | Wt 206.8 lb

## 2021-11-12 DIAGNOSIS — E039 Hypothyroidism, unspecified: Secondary | ICD-10-CM

## 2021-11-12 DIAGNOSIS — I25118 Atherosclerotic heart disease of native coronary artery with other forms of angina pectoris: Secondary | ICD-10-CM

## 2021-11-12 DIAGNOSIS — M25511 Pain in right shoulder: Secondary | ICD-10-CM

## 2021-11-12 DIAGNOSIS — E785 Hyperlipidemia, unspecified: Secondary | ICD-10-CM | POA: Diagnosis not present

## 2021-11-12 DIAGNOSIS — Z955 Presence of coronary angioplasty implant and graft: Secondary | ICD-10-CM | POA: Diagnosis not present

## 2021-11-12 DIAGNOSIS — K219 Gastro-esophageal reflux disease without esophagitis: Secondary | ICD-10-CM

## 2021-11-12 MED ORDER — LEVOTHYROXINE SODIUM 25 MCG PO TABS: 25.0000 ug | ORAL_TABLET | Freq: Every day | ORAL | 3 refills | Status: DC

## 2021-11-12 MED ORDER — PANTOPRAZOLE SODIUM 40 MG PO TBEC: 40.0000 mg | DELAYED_RELEASE_TABLET | Freq: Every day | ORAL | 3 refills | Status: DC

## 2021-11-12 NOTE — Assessment & Plan Note (Signed)
Continue diet and exercise Continue medication Recommend mild weight reduction Ensure proper hydration Goal of reduction of further injury/damage to heart/brain

## 2021-11-12 NOTE — Assessment & Plan Note (Signed)
No mechanism of injury Recommend stretching exercises Unable to take NSAIDs d/t use of plavix Recommend consult with ortho for additional exercises Continue to remain hydrated and continue to stretch

## 2021-11-12 NOTE — Assessment & Plan Note (Signed)
Pulled for refill 

## 2021-11-12 NOTE — Patient Instructions (Signed)
Tumeric OTC to help with inflammation  Voltaren, topical anti-inflammatory for shoulder

## 2021-11-12 NOTE — Assessment & Plan Note (Signed)
Continue medication recommend diet low in saturated fat and regular exercise - 30 min at least 5 times per week

## 2021-11-12 NOTE — Assessment & Plan Note (Signed)
Advise use of heart healthy diet and cholesterol modification to assist in prevention of other cardiac concerns Recommend increase in diet of healthier fat choices- low fat meats, oils that are not solid at room temperature, nuts, seeds, fish- cod, halibut, salmon, and avocado. Exercise can also increase this number.  Supplemental omega 3's can be taken as well but are not as helpful as dietary/exercise changes.

## 2021-11-12 NOTE — Assessment & Plan Note (Signed)
Denies concerns with current meds/POC; f/b Cardiology

## 2021-11-12 NOTE — Progress Notes (Signed)
Established patient visit   Patient: [name] [name]   DOB: Jan 10, 1951   70 y.o. Male  MRN: [id number] Visit Date: 11/12/2021  Today's healthcare provider: Gwyneth Sprout, FNP   Chief Complaint  Patient presents with   Hyperlipidemia   Hypothyroidism   Subjective    HPI  Lipid/Cholesterol, Follow-up  Last lipid panel Other pertinent labs  Lab Results  Component Value Date   CHOL 135 08/28/2020   HDL 46 08/28/2020   LDLCALC 71 08/28/2020   TRIG 96 08/28/2020   CHOLHDL 2.9 08/28/2020   Lab Results  Component Value Date   ALT 38 08/28/2020   AST 32 08/28/2020   PLT 151 08/28/2020   TSH 3.580 08/28/2020     He was last seen for this 15 months ago.  Management since that visit includes none.  He reports excellent compliance with treatment. He is not having side effects.   Symptoms: No chest pain No chest pressure/discomfort  No dyspnea No lower extremity edema  No numbness or tingling of extremity No orthopnea  No palpitations No paroxysmal nocturnal dyspnea  No speech difficulty No syncope   Current diet: well balanced Current exercise: walking  The 10-year ASCVD risk score (Arnett DK, et al., 2019) is: 17.6%  ---------------------------------------------------------------------------------------------------  Hypothyroid, follow-up  Lab Results  Component Value Date   TSH 3.580 08/28/2020   TSH 2.987 05/04/2019    Wt Readings from Last 3 Encounters:  11/12/21 206 lb 12.8 oz (93.8 kg)  07/16/21 207 lb 6 oz (94.1 kg)  02/04/21 201 lb 9.6 oz (91.4 kg)    He was last seen for hypothyroid 15 months ago.  Management since that visit includes none. He reports excellent compliance with treatment. He is not having side effects.   Symptoms: No change in energy level No constipation  No diarrhea No heat / cold intolerance  No nervousness No palpitations  No weight changes     -----------------------------------------------------------------------------------------   Medications: Outpatient Medications Prior to Visit  Medication Sig   acetaminophen (TYLENOL) 500 MG tablet Take 500 mg by mouth every 6 (six) hours as needed.   aspirin EC 81 MG tablet Take 81 mg by mouth daily.    cetirizine (ZYRTEC) 10 MG tablet Take 10 mg by mouth at bedtime.    clopidogrel (PLAVIX) 75 MG tablet Take 1 tablet by mouth once daily   ezetimibe (ZETIA) 10 MG tablet Take 1 tablet (10 mg total) by mouth every evening.   rosuvastatin (CRESTOR) 40 MG tablet Take 1 tablet (40 mg total) by mouth daily.   tadalafil (CIALIS) 20 MG tablet 1 tab 1 hour prior to intercourse   VITAMIN D, CHOLECALCIFEROL, PO Take by mouth daily.   [DISCONTINUED] levothyroxine (SYNTHROID) 25 MCG tablet TAKE 1 TABLET BY MOUTH ONCE DAILY BEFORE BREAKFAST (CALL  OFFICE  TO  MAKE  APPOINTMENT  FOR  FURTHER  REFILLS)   [DISCONTINUED] pantoprazole (PROTONIX) 40 MG tablet Take 1 tablet (40 mg total) by mouth daily.   diltiazem (CARDIZEM CD) 240 MG 24 hr capsule Take 1 capsule (240 mg total) by mouth daily.   nitroGLYCERIN (NITROSTAT) 0.4 MG SL tablet Place 1 tablet (0.4 mg total) under the tongue every 5 (five) minutes as needed for chest pain.   [DISCONTINUED] benzonatate (TESSALON) 100 MG capsule Take 1 capsule (100 mg total) by mouth 3 (three) times daily as needed for cough.   [DISCONTINUED] benzonatate (TESSALON) 200 MG capsule Take 1 capsule (200 mg total) by mouth  2 (two) times daily as needed for cough.   [DISCONTINUED] predniSONE (DELTASONE) 20 MG tablet Take 1 tablet (20 mg total) by mouth daily with breakfast. (Patient not taking: Reported on 07/16/2021)   No facility-administered medications prior to visit.    Review of Systems     Objective    BP 132/60   Pulse 67   Resp 16   Wt 206 lb 12.8 oz (93.8 kg)   SpO2 96%   BMI 28.05 kg/m    Physical Exam Vitals and nursing note reviewed.   Constitutional:      Appearance: Normal appearance. He is overweight.  HENT:     Head: Normocephalic and atraumatic.  Eyes:     Pupils: Pupils are equal, round, and reactive to light.  Cardiovascular:     Rate and Rhythm: Normal rate and regular rhythm.     Pulses: Normal pulses.     Heart sounds: Normal heart sounds.  Pulmonary:     Effort: Pulmonary effort is normal.     Breath sounds: Normal breath sounds.  Musculoskeletal:        General: Normal range of motion.       Arms:     Cervical back: Normal range of motion.     Comments: Hx of intermittent pain in R shoulder; first noted following push ups No weakness noted on exam  Skin:    General: Skin is warm and dry.     Capillary Refill: Capillary refill takes less than 2 seconds.  Neurological:     General: No focal deficit present.     Mental Status: He is alert and oriented to person, place, and time. Mental status is at baseline.      No results found for any visits on 11/12/21.  Assessment & Plan     Problem List Items Addressed This Visit       Cardiovascular and Mediastinum   Atherosclerotic heart disease of native coronary artery with other forms of angina pectoris ([place]) - Primary    Continue diet and exercise Continue medication Recommend mild weight reduction Ensure proper hydration Goal of reduction of further injury/damage to heart/brain        Digestive   Gastroesophageal reflux disease    Pulled for Rx refill      Relevant Medications   pantoprazole (PROTONIX) 40 MG tablet     Endocrine   Hypothyroidism    Pulled for refill      Relevant Medications   levothyroxine (SYNTHROID) 25 MCG tablet     Other   Status post coronary artery stent placement    Advise use of heart healthy diet and cholesterol modification to assist in prevention of other cardiac concerns Recommend increase in diet of healthier fat choices- low fat meats, oils that are not solid at room temperature, nuts, seeds,  fish- cod, halibut, salmon, and avocado. Exercise can also increase this number.  Supplemental omega 3's can be taken as well but are not as helpful as dietary/exercise changes.       Hyperlipidemia LDL goal <70    Continue medication recommend diet low in saturated fat and regular exercise - 30 min at least 5 times per week       Dyslipidemia    Denies concerns with current meds/POC; f/b Cardiology      Acute pain of right shoulder    No mechanism of injury Recommend stretching exercises Unable to take NSAIDs d/t use of plavix Recommend consult with ortho for additional exercises  Continue to remain hydrated and continue to stretch        Return for annual examination.      Vonna Kotyk, FNP, have reviewed all documentation for this visit. The documentation on 11/12/21 for the exam, diagnosis, procedures, and orders are all accurate and complete.    Gwyneth Sprout, [place] [phone] (phone) [phone] (fax)  [place]

## 2021-11-12 NOTE — Assessment & Plan Note (Signed)
Pulled for Rx refill

## 2022-01-15 ENCOUNTER — Other Ambulatory Visit: Payer: Self-pay | Admitting: Cardiovascular Disease

## 2022-01-15 DIAGNOSIS — R079 Chest pain, unspecified: Secondary | ICD-10-CM

## 2022-01-20 ENCOUNTER — Other Ambulatory Visit: Payer: Self-pay | Admitting: Cardiovascular Disease

## 2022-01-20 ENCOUNTER — Telehealth: Payer: Self-pay | Admitting: Family Medicine

## 2022-01-20 DIAGNOSIS — E039 Hypothyroidism, unspecified: Secondary | ICD-10-CM

## 2022-01-20 DIAGNOSIS — R002 Palpitations: Secondary | ICD-10-CM

## 2022-01-20 DIAGNOSIS — I471 Supraventricular tachycardia: Secondary | ICD-10-CM

## 2022-01-20 NOTE — Telephone Encounter (Signed)
Copied from [place] [id number]. Topic: Quick Communication - Rx Refill/Question >> Jan 20, 2022  9:59 AM Tessa Lerner A wrote: Medication: levothyroxine (SYNTHROID) 25 MCG tablet [[id number]]  - patient would like a 90 day supply of the medication   Has the patient contacted their pharmacy? No. The patient was uncertain of the medication's status  (Agent: If no, request that the patient contact the pharmacy for the refill. If patient does not wish to contact the pharmacy document the reason why and proceed with request.) (Agent: If yes, when and what did the pharmacy advise?)  Preferred Pharmacy (with phone number or street name): [place], [place] - [address] [place] [address] Phone: [phone] Fax: [phone] Hours: Not open 24 hours  Has the patient been seen for an appointment in the last year OR does the patient have an upcoming appointment? Yes.    Agent: Please be advised that RX refills may take up to 3 business days. We ask that you follow-up with your pharmacy.

## 2022-01-20 NOTE — Telephone Encounter (Signed)
[place] called and spoke to [place], [facility] about the refill(s) Levothyroxine requested. Advised it was sent on 11/12/21/#90/3 refill(s). She says they have it on file and can get it ready for the patient.

## 2022-01-26 DIAGNOSIS — M7541 Impingement syndrome of right shoulder: Secondary | ICD-10-CM | POA: Diagnosis not present

## 2022-02-12 ENCOUNTER — Telehealth: Payer: Self-pay | Admitting: Cardiovascular Disease

## 2022-02-12 NOTE — Telephone Encounter (Signed)
Chart reviewed.  If he feels that symptoms have been constant over the past few wks, coming in for a nurse visit for ECG is appropriate to eval his heart rhythm.  If we find something, it could negate a need for monitoring.  If symptoms have been more intermittent, and ECG shows a normal rhythm, we can then place a zio AT. ? ?Agree re: chest heaviness - consider ED eval. ?

## 2022-02-12 NOTE — Telephone Encounter (Signed)
The last few weeks feeling more tired and exhausted. He reports palpitations as well and is concerned about what his rhythm may be. They scheduled him appointment in April. He wanted to inquire if he should wear a monitor. He reports some chest heaviness and reviewed ED precautions. He is not concerned at this time but wants to come in for appointment. Advised we have him scheduled and that I will check with provider. Again reviewed symptoms and signs that would require immediate evaluation. He acknowledged recommendations but does not feel it is needed at this time. Will reach out to provider to inquire about monitor and place him on cancellation list. He was agreeable with plan with no further questions.  ?

## 2022-02-12 NOTE — Telephone Encounter (Signed)
Patient c/o Palpitations:  High priority if patient c/o lightheadedness, shortness of breath, or chest pain ? ?How long have you had palpitations/irregular HR/ Afib? Are you having the symptoms now? Las few weeks, yes ? ?Are you currently experiencing lightheadedness, SOB or CP? no ? ?Do you have a history of afib (atrial fibrillation) or irregular heart rhythm? afib ? ?Have you checked your BP or HR? (document readings if available): no ? ?Are you experiencing any other symptoms? Chest pain sometimes ?

## 2022-02-13 NOTE — Telephone Encounter (Signed)
Left voicemail message to call back for review of recommendations.  

## 2022-02-16 NOTE — Telephone Encounter (Signed)
Spoke with patient and reviewed provider recommendations. He verbalized understanding but does not wish to come in for EKG at this time. He requested to call back if he should have any further episodes and at that time schedule EKG visit. Reviewed ED precautions again with regards to his reports of chest heaviness. He verbalized understanding with no further questions at this time.  ?

## 2022-03-19 ENCOUNTER — Ambulatory Visit: Payer: Medicare Other | Admitting: Physician Assistant

## 2022-03-19 ENCOUNTER — Encounter: Payer: Self-pay | Admitting: Physician Assistant

## 2022-03-19 VITALS — BP 120/58 | HR 72 | Ht 72.0 in | Wt 212.2 lb

## 2022-03-19 DIAGNOSIS — E785 Hyperlipidemia, unspecified: Secondary | ICD-10-CM | POA: Diagnosis not present

## 2022-03-19 DIAGNOSIS — E7849 Other hyperlipidemia: Secondary | ICD-10-CM

## 2022-03-19 DIAGNOSIS — I471 Supraventricular tachycardia: Secondary | ICD-10-CM | POA: Diagnosis not present

## 2022-03-19 DIAGNOSIS — R072 Precordial pain: Secondary | ICD-10-CM

## 2022-03-19 DIAGNOSIS — I480 Paroxysmal atrial fibrillation: Secondary | ICD-10-CM

## 2022-03-19 DIAGNOSIS — I2510 Atherosclerotic heart disease of native coronary artery without angina pectoris: Secondary | ICD-10-CM

## 2022-03-19 MED ORDER — CLOPIDOGREL BISULFATE 75 MG PO TABS: 75.0000 mg | ORAL_TABLET | Freq: Every day | ORAL | 3 refills | Status: DC

## 2022-03-19 NOTE — Patient Instructions (Addendum)
Medication Instructions:  ?No changes at this time. ? ?*If you need a refill on your cardiac medications before your next appointment, please call your pharmacy* ? ? ?Lab Work: ?CBC, TSH, CMET, and Lipid panel. This can be done on the same day as your stress test. When you check in just let them know you are there for Stress Test and Labs.  ? ?If you have labs (blood work) drawn today and your tests are completely normal, you will receive your results only by: ?MyChart Message (if you have MyChart) OR ?A paper copy in the mail ?If you have any lab test that is abnormal or we need to change your treatment, we will call you to review the results. ? ? ?Testing/Procedures: ?[facility] MYOVIEW ? ?Your caregiver has ordered a Stress Test with nuclear imaging. The purpose of this test is to evaluate the blood supply to your heart muscle. This procedure is referred to as a "Non-Invasive Stress Test." This is because other than having an IV started in your vein, nothing is inserted or "invades" your body. Cardiac stress tests are done to find areas of poor blood flow to the heart by determining the extent of coronary artery disease (CAD). Some patients exercise on a treadmill, which naturally increases the blood flow to your heart, while others who are  unable to walk on a treadmill due to physical limitations have a pharmacologic/chemical stress agent called Lexiscan . This medicine will mimic walking on a treadmill by temporarily increasing your coronary blood flow.  ? ?Please note: these test may take anywhere between 2-4 hours to complete ? ?PLEASE REPORT TO [facility] MEDICAL MALL ENTRANCE  ?THE VOLUNTEERS AT THE FIRST DESK WILL DIRECT YOU WHERE TO GO ? ?Date of Procedure:_______________________ ? ?Arrival Time for Procedure:________________________ ? ? ? ?PLEASE NOTIFY THE OFFICE AT LEAST 24 HOURS IN ADVANCE IF YOU ARE UNABLE TO KEEP YOUR APPOINTMENT.  [phone] ?AND  ?PLEASE NOTIFY NUCLEAR MEDICINE AT [facility] AT LEAST 50 HOURS IN  ADVANCE IF YOU ARE UNABLE TO KEEP YOUR APPOINTMENT. [phone] ? ?How to prepare for your Myoview test: ? ?Do not eat or drink after midnight ?No caffeine for 24 hours prior to test ?No smoking 24 hours prior to test. ?Your medication may be taken with water.  If your doctor stopped a medication because of this test, do not take that medication. ?Ladies, please do not wear dresses.  Skirts or pants are appropriate. Please wear a short sleeve shirt. ?No perfume, cologne or lotion. ?Wear comfortable walking shoes. No heels! ? ? ? ?Follow-Up: ?At [facility], you and your health needs are our priority.  As part of our continuing mission to provide you with exceptional heart care, we have created designated Provider Care Teams.  These Care Teams include your primary Cardiologist (physician) and Advanced Practice Providers (APPs -  Physician Assistants and Nurse Practitioners) who all work together to provide you with the care you need, when you need it. ? ? ?Your next appointment:   ?1 month(s) ? ?The format for your next appointment:   ?In Person ? ?Provider:   ?Kathlyn Sacramento, MD or Christell Faith, PA-C ? ?Important Information About Sugar ? ? ? ? ?  ?

## 2022-03-19 NOTE — Progress Notes (Signed)
? ?Cardiology Office Note   ? ?Date:  03/19/2022  ? ?ID:  [name] [name], DOB [birth year], MRN [id number] ? ?PCP:  Gwyneth Sprout, FNP  ?Cardiologist:  Kathlyn Sacramento, MD  ?Electrophysiologist:  None  ? ?Chief Complaint: Follow-up ? ?History of Present Illness:  ? ?[name] [name] is a 71 y.o. 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 fib. ?  ?He was previously managed by Dr. Dion Saucier in [place], [place].  He was initially diagnosed with A. fib in 2004 and was managed with metoprolol though this was discontinued secondary to nightmares.  Since then, he has been managed with diltiazem.  Prior stress echo in 2013 was unremarkable.  Holter monitor in 12/2014 showed the predominant rhythm was sinus with an average heart rate of 71 bpm, minimum heart rate 45 bpm, maximum heart rate 118 bpm, rare isolated PVCs, frequent isolated PACs totaling 4587, 6 atrial couplets, 3 runs of SVT with the longest lasting 7 beats with a maximum rate of 141 bpm, no significant pauses.  No evidence of A. Fib.  He was seen in the office in 02/2018 and reported tolerating high-dose Crestor.  He underwent treadmill stress test on 03/16/2018 that showed no evidence of ischemia with good exercise capacity with an exercise duration of 7 minutes and 48 seconds.  He was evaluated in 04/2019 for palpitations and chest pain.  Echo showed an EF of 55-60%, mild concentric LVH, normal diastolic function, normal RVSF, grossly normal mitral and tricuspid valves, mild AI.  Subsequent Zio in 05/2019 showed NSR with an average heart rate of 73 bpm with 27 episodes of short SVT with the longest episode  lasting 13 beats.  He underwent coronary CTA in 06/2019, which showed left main FFR 0.98, ostial LAD FFR 0.98, proximal LAD FFR 0.50, proximal LCx FFR 0.95, mid LCx FFR 0.78, OM1 FFR 0.77, proximal RCA FFR 0.98, mid RCA FFR 0.82, PDA FFR 0.79. Given this, he underwent diagnostic LHC on 06/26/2019 that showed severe single-vessel CAD with 80-90% proximal LAD stenosis with heavy calcification as well as mild to moderate nonobstructive CAD involving the mid LAD, mid LCx, and mid RCA with elevated LVEDP and normal LV contraction consistent with diastolic dysfunction. Given the heavy calcification of the LAD, it was recommended he undergo staged PCI with atherectomy. He underwent successful complex orbital atherectomy and 2 overlapping DES to the proximal and mid LAD extending into the ostium with balloon angioplasty of the ostial D2. Procedure was complicated by a non-flow limiting dissection in the ostial diagonal with normal flow that did not require treatment.  ?  ?He was last seen in the office in 07/2021 and was doing well from a cardiac perspective without symptoms of angina or decompensation.  He noted an improvement in his overall palpitation burden following prior titration of Cardizem CD to 240 mg daily.  Future lab orders were placed at that time and remain pending. ? ?He contacted our office in early 02/2022 noting an increase in palpitation burden as well as chest heaviness. ? ?He comes in the office today noting an improvement in his palpitation burden.  He does continue to note some generalized fatigue and  leg heaviness with some chest heaviness.  Symptoms feel similar to what he experienced leading up to his prior PCI.  He is without frank angina.  No dizziness, presyncope, or syncope.  He is tolerating all cardiac medications without issues.  No falls or symptoms concerning for bleeding. ? ? ?Labs independently reviewed: ?08/2020 - Hgb 13.8, PLT 151, TC 135, TG 96, HDL 46, LDL 71, TSH normal, BUN 20, serum  creatinine 0.82, potassium 4.0, albumin 4.3, AST/ALT normal ? ?Past Medical History:  ?Diagnosis Date  ? Acquired hypothyroidism   ? Coronary artery disease   ? a. cath 06/2019 s/p  orbital atherectomy and 2 overlapped DES to the mid and proximal LAD; angiopasty to 2nd dig  ? ED (erectile dysfunction)   ? Melanoma ([place])   ? Paroxysmal atrial fibrillation (HCC)   ? Diagnosed in 2004  ? Paroxysmal SVT (supraventricular tachycardia) (HCC)   ? Pure hypercholesterolemia   ? ? ?Past Surgical History:  ?Procedure Laterality Date  ? CORONARY ATHERECTOMY N/A 07/05/2019  ? Procedure: CORONARY ATHERECTOMY;  Surgeon: Wellington Hampshire, MD;  Location: [facility];  Service: Cardiovascular;  Laterality: N/A;  ? CORONARY BALLOON ANGIOPLASTY N/A 07/05/2019  ? Procedure: CORONARY BALLOON ANGIOPLASTY;  Surgeon: Wellington Hampshire, MD;  Location: [facility];  Service: Cardiovascular;  Laterality: N/A;  ? CORONARY STENT INTERVENTION N/A 07/05/2019  ? Procedure: CORONARY STENT INTERVENTION;  Surgeon: Wellington Hampshire, MD;  Location: [facility];  Service: Cardiovascular;  Laterality: N/A;  ? LEFT HEART CATH AND CORONARY ANGIOGRAPHY Left 06/26/2019  ? Procedure: LEFT HEART CATH AND CORONARY ANGIOGRAPHY;  Surgeon: Nelva Bush, MD;  Location: [facility];  Service: Cardiovascular;  Laterality: Left;  ? MELANOMA EXCISION  02/17/1983  ? NECK SURGERY    ? XI ROBOTIC ASSISTED INGUINAL HERNIA REPAIR WITH MESH Bilateral 04/18/2020  ? Procedure: XI ROBOTIC ASSISTED INGUINAL HERNIA REPAIR WITH MESH;  Surgeon: Jules Husbands, MD;  Location: [facility];  Service: General;  Laterality: Bilateral;  ? ? ?Current Medications: ?Current Meds  ?Medication Sig  ? acetaminophen (TYLENOL) 500 MG tablet Take 500 mg by mouth every 6 (six) hours as needed.  ? aspirin EC 81 MG tablet Take 81 mg by mouth daily.   ? cetirizine (ZYRTEC) 10 MG tablet Take 10 mg by mouth at bedtime.   ? diltiazem (CARDIZEM CD) 240 MG 24 hr capsule Take 1  capsule by mouth once daily  ? ezetimibe (ZETIA) 10 MG tablet TAKE 1 TABLET BY MOUTH ONCE DAILY IN THE EVENING  ? famotidine (PEPCID) 20 MG tablet Take 20 mg by mouth daily.  ? levothyroxine (SYNTHROID) 25 MCG tablet Take 1 tablet (25 mcg total) by mouth daily before breakfast.  ? nitroGLYCERIN (NITROSTAT) 0.4 MG SL tablet Place 1 tablet (0.4 mg total) under the tongue every 5 (five) minutes as needed for chest pain.  ? rosuvastatin (CRESTOR) 40 MG tablet Take 1 tablet by mouth once daily  ? tadalafil (CIALIS) 20 MG tablet 1 tab 1 hour prior to intercourse  ? VITAMIN D, CHOLECALCIFEROL, PO Take by mouth daily.  ? [DISCONTINUED] clopidogrel (PLAVIX) 75 MG tablet Take 1 tablet by mouth once daily  ? ? ?Allergies:   Hydrocodone-acetaminophen and Vicodin [hydrocodone-acetaminophen]  ? ?Social History  ? ?Socioeconomic History  ? Marital status: Married  ?  Spouse name: Morey Hummingbird   ? Number of children: 2  ? Years of education: Not on file  ? Highest education level: Not on  file  ?Occupational History  ? Not on file  ?Tobacco Use  ? Smoking status: Never  ? Smokeless tobacco: Never  ?Vaping Use  ? Vaping Use: Never used  ?Substance and Sexual Activity  ? Alcohol use: Yes  ?  Comment: Occ.   ? Drug use: No  ? Sexual activity: Yes  ?  Birth control/protection: None  ?Other Topics Concern  ? Not on file  ?Social History Narrative  ? Not on file  ? ?Social Determinants of Health  ? ?Financial Resource Strain: Not on file  ?Food Insecurity: Not on file  ?Transportation Needs: Not on file  ?Physical Activity: Not on file  ?Stress: Not on file  ?Social Connections: Not on file  ?  ? ?Family History:  ?The patient's family history includes Atrial fibrillation in his mother; Heart disease in his father; Stroke in his father. ? ?ROS:   ?12 point review of system is negative as otherwise noted in the HPI. ? ? ?EKGs/Labs/Other Studies Reviewed:   ? ?Studies reviewed were summarized above. The additional studies were reviewed  today: ? ?2D Echo 04/2018: ?1. The left ventricle has normal systolic function, with an ejection fraction of 55-60%. The cavity size was normal. There is mild concentric left ventricular hypertrophy. Left ventricular d

## 2022-03-20 DIAGNOSIS — M25511 Pain in right shoulder: Secondary | ICD-10-CM | POA: Diagnosis not present

## 2022-03-24 ENCOUNTER — Telehealth: Payer: Self-pay | Admitting: Physician Assistant

## 2022-03-24 NOTE — Telephone Encounter (Signed)
Spoke with patient and we reviewed all instructions for his stress test scheduled for tomorrow. Advised that he can take all medications with a sip of water. He inquired about the betablocker and reviewed that for a chemical stress test it is not necessary to hold that medication. He verbalized understanding of instructions with no further questions at this time.  ?

## 2022-03-24 NOTE — Telephone Encounter (Signed)
Patient is having stress test tomorrow, he wants to know what medication he can take tomorrow morning, and which ones he should not take.   ?

## 2022-03-25 ENCOUNTER — Other Ambulatory Visit
Admission: RE | Admit: 2022-03-25 | Discharge: 2022-03-25 | Disposition: A | Discharge status: Home / Self Care | Payer: Medicare Other | Source: Home / Self Care | Attending: Physician Assistant | Admitting: Physician Assistant

## 2022-03-25 ENCOUNTER — Encounter
Admission: RE | Admit: 2022-03-25 | Discharge: 2022-03-25 | Disposition: A | Discharge status: Home / Self Care | Payer: Medicare Other | Source: Ambulatory Visit | Attending: Physician Assistant | Admitting: Physician Assistant

## 2022-03-25 DIAGNOSIS — R072 Precordial pain: Secondary | ICD-10-CM | POA: Insufficient documentation

## 2022-03-25 DIAGNOSIS — I480 Paroxysmal atrial fibrillation: Secondary | ICD-10-CM

## 2022-03-25 DIAGNOSIS — E7849 Other hyperlipidemia: Secondary | ICD-10-CM

## 2022-03-25 DIAGNOSIS — E785 Hyperlipidemia, unspecified: Secondary | ICD-10-CM | POA: Diagnosis not present

## 2022-03-25 DIAGNOSIS — I2510 Atherosclerotic heart disease of native coronary artery without angina pectoris: Secondary | ICD-10-CM | POA: Insufficient documentation

## 2022-03-25 DIAGNOSIS — I471 Supraventricular tachycardia, unspecified: Secondary | ICD-10-CM

## 2022-03-25 LAB — NM MYOCAR MULTI W/SPECT W/WALL MOTION / EF
LV dias vol: 117 mL (ref 62–150)
LV sys vol: 36 mL
Nuc Stress EF: 69 %
Peak HR: 94 {beats}/min
Percent HR: 63 %
Rest HR: 64 {beats}/min
Rest Nuclear Isotope Dose: 10.6 mCi
SDS: 3
SRS: 2
SSS: 5
ST Depression (mm): 0 mm
Stress Nuclear Isotope Dose: 32.2 mCi
TID: 1.15

## 2022-03-25 LAB — COMPREHENSIVE METABOLIC PANEL
ALT: 32 U/L (ref 0–44)
AST: 32 U/L (ref 15–41)
Albumin: 4.2 g/dL (ref 3.5–5.0)
Alkaline Phosphatase: 51 U/L (ref 38–126)
Anion gap: 8 (ref 5–15)
BUN: 18 mg/dL (ref 8–23)
CO2: 25 mmol/L (ref 22–32)
Calcium: 10 mg/dL (ref 8.9–10.3)
Chloride: 106 mmol/L (ref 98–111)
Creatinine, Ser: 0.82 mg/dL (ref 0.61–1.24)
GFR, Estimated: >60 mL/min (ref >60)
Glucose, Bld: 103 mg/dL — ABNORMAL HIGH (ref 70–99)
Potassium: 3.9 mmol/L (ref 3.5–5.1)
Sodium: 139 mmol/L (ref 135–145)
Total Bilirubin: 0.6 mg/dL (ref 0.3–1.2)
Total Protein: 7 g/dL (ref 6.5–8.1)

## 2022-03-25 LAB — CBC
HCT: 42.9 % (ref 39.0–52.0)
Hemoglobin: 14.3 g/dL (ref 13.0–17.0)
MCH: 29.1 pg (ref 26.0–34.0)
MCHC: 33.3 g/dL (ref 30.0–36.0)
MCV: 87.2 fL (ref 80.0–100.0)
Platelets: 166 10*3/uL (ref 150–400)
RBC: 4.92 MIL/uL (ref 4.22–5.81)
RDW: 14 % (ref 11.5–15.5)
WBC: 4.5 10*3/uL (ref 4.0–10.5)
nRBC: 0 % (ref 0.0–0.2)

## 2022-03-25 LAB — LIPID PANEL
Cholesterol: 145 mg/dL (ref 0–200)
HDL: 47 mg/dL (ref >40)
LDL Cholesterol: 77 mg/dL (ref 0–99)
Total CHOL/HDL Ratio: 3.1 RATIO
Triglycerides: 104 mg/dL (ref <150)
VLDL: 21 mg/dL (ref 0–40)

## 2022-03-25 LAB — TSH: TSH: 2.341 u[IU]/mL (ref 0.350–4.500)

## 2022-03-25 MED ORDER — TECHNETIUM TC 99M TETROFOSMIN IV KIT
32.1900 | PACK | Freq: Once | INTRAVENOUS | Status: AC | PRN
  Administered 2022-03-25: 32.19 via INTRAVENOUS

## 2022-03-25 MED ORDER — REGADENOSON 0.4 MG/5ML IV SOLN
0.4000 mg | Freq: Once | INTRAVENOUS | Status: AC
Start: 2022-03-25 — End: 2022-03-25
  Administered 2022-03-25: 0.4 mg via INTRAVENOUS

## 2022-03-25 MED ORDER — TECHNETIUM TC 99M TETROFOSMIN IV KIT
10.0000 | PACK | Freq: Once | INTRAVENOUS | Status: AC | PRN
  Administered 2022-03-25: 10.64 via INTRAVENOUS

## 2022-04-01 ENCOUNTER — Other Ambulatory Visit: Payer: Self-pay | Admitting: Orthopedic Surgery

## 2022-04-01 DIAGNOSIS — G8929 Other chronic pain: Secondary | ICD-10-CM

## 2022-04-03 ENCOUNTER — Ambulatory Visit: Payer: Medicare Other

## 2022-04-06 ENCOUNTER — Ambulatory Visit
Admission: RE | Admit: 2022-04-06 | Discharge: 2022-04-06 | Disposition: A | Discharge status: Home / Self Care | Payer: Medicare Other | Source: Ambulatory Visit | Attending: Orthopedic Surgery | Admitting: Orthopedic Surgery

## 2022-04-06 DIAGNOSIS — G8929 Other chronic pain: Secondary | ICD-10-CM | POA: Insufficient documentation

## 2022-04-06 DIAGNOSIS — M25511 Pain in right shoulder: Secondary | ICD-10-CM | POA: Diagnosis not present

## 2022-04-08 ENCOUNTER — Telehealth: Payer: Self-pay | Admitting: Cardiovascular Disease

## 2022-04-08 DIAGNOSIS — M75121 Complete rotator cuff tear or rupture of right shoulder, not specified as traumatic: Secondary | ICD-10-CM | POA: Diagnosis not present

## 2022-04-08 NOTE — Telephone Encounter (Signed)
? ?  Patient Name: [name] [name]  ?DOB: 22-Feb-1951 ?MRN: [id number] ? ?Primary Cardiologist: Kathlyn Sacramento, MD ? ?Chart reviewed as part of pre-operative protocol coverage.  ? ?The patient already has an upcoming appointment scheduled 5/17 with Christell Faith, PA-C at which time this clearance should be addressed. (Procedure date of 06/03/2022 falls after appointment.) ? ?- Regarding Plavix Dr. Fletcher Anon granted clearance for holding for 5 days for an unrelated surgery in 03/2020. Giving that he has had no interim PCI since then may be able to use this recommendation at upcoming scheduled visit. ? ?- I added "preop" comment to appointment notes so that provider is aware to address at time of OV. Per office protocol, the provider seeing this patient should forward their finalized clearance decision to requesting party below. ? ?- Will fax update to requesting surgeon so they are aware. Will remove from preop box as separate preop APP input not necessary at this time. ? ?Jimmy Picket, NP ?04/08/2022, 3:33 PM  ?

## 2022-04-08 NOTE — Telephone Encounter (Signed)
? ?  Pre-operative Risk Assessment  ?  ?Patient Name: [name] [name]  ?DOB: September 05, 1951 ?MRN: [id number]  ? ?  ? ?Request for Surgical Clearance   ? ?Procedure:   RT shoulder arthroscopy  ? ?Date of Surgery:  Clearance 06/03/22                              ?   ?Surgeon:  Dr Kurtis Bushman ?Surgeon's Group or Practice Name:  [facility] ?Phone number:  [phone] ?Fax number:  [phone] ?  ?Type of Clearance Requested:   ?- Pharmacy:  Hold Clopidogrel (Plavix) instructions  ?  ?Type of Anesthesia:  Local /Spinal ?  ?Additional requests/questions:   ? ?Signed, ?Caryl Pina Gerringer   ?04/08/2022, 3:11 PM  ? ?

## 2022-04-20 NOTE — Progress Notes (Signed)
? ?Cardiology Office Note   ? ?Date:  04/22/2022  ? ?ID:  [name] [name], DOB 10-Feb-1951, MRN [id number] ? ?PCP:  Gwyneth Sprout, FNP  ?Cardiologist:  Kathlyn Sacramento, MD  ?Electrophysiologist:  None  ? ?Chief Complaint: Follow up ? ?History of Present Illness:  ? ?[name] [name] is a 71 y.o. 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 ? ?He was previously managed by Dr. Dion Saucier in [place], [place].  He was initially diagnosed with A. fib in 2004 and was managed with metoprolol though this was discontinued secondary to nightmares.  Since then, he has been managed with diltiazem.  Prior stress echo in 2013 was unremarkable.  Holter monitor in 12/2014 showed the predominant rhythm was sinus with an average heart rate of 71 bpm, minimum heart rate 45 bpm, maximum heart rate 118 bpm, rare isolated PVCs, frequent isolated PACs totaling 4587, 6 atrial couplets, 3 runs of SVT with the longest lasting 7 beats with a maximum rate of 141 bpm, no significant pauses.  No evidence of A. Fib.  He was seen in the office in 02/2018 and reported tolerating high-dose Crestor.  He underwent treadmill stress test on 03/16/2018 that showed no evidence of ischemia with good exercise capacity with an exercise duration of 7 minutes and 48 seconds.  He was evaluated in 04/2019 for palpitations and chest pain.  Echo showed an EF of 55-60%, mild concentric LVH, normal diastolic function, normal RVSF, grossly normal mitral and tricuspid valves, mild AI.  Subsequent Zio in 05/2019 showed NSR with an average heart rate of 73 bpm with 27 episodes of short SVT with the longest episode lasting  13 beats.  He underwent coronary CTA in 06/2019, which showed left main FFR 0.98, ostial LAD FFR 0.98, proximal LAD FFR 0.50, proximal LCx FFR 0.95, mid LCx FFR 0.78, OM1 FFR 0.77, proximal RCA FFR 0.98, mid RCA FFR 0.82, PDA FFR 0.79. Given this, he underwent diagnostic LHC on 06/26/2019 that showed severe single-vessel CAD with 80-90% proximal LAD stenosis with heavy calcification as well as mild to moderate nonobstructive CAD involving the mid LAD, mid LCx, and mid RCA with elevated LVEDP and normal LV contraction consistent with diastolic dysfunction. Given the heavy calcification of the LAD, it was recommended he undergo staged PCI with atherectomy. He underwent successful complex orbital atherectomy and 2 overlapping DES to the proximal and mid LAD extending into the ostium with balloon angioplasty of the ostial D2. Procedure was complicated by a non-flow limiting dissection in the ostial diagonal with normal flow that did not require treatment.  ?  ?He was seen in the office in 07/2021 and was doing well from a cardiac perspective without symptoms of angina or decompensation.  He noted an improvement in his overall palpitation burden following prior titration of Cardizem CD to 240 mg daily.   ? ?He was seen on 03/19/2022 noting some palpitations and chest/leg heaviness and fatigue that felt similar to what he experienced leading up to his PCI.  At the time of his visit, his palpitation burden had improved.  Subsequent Lexiscan MPI on 03/25/2022 showed no evidence of ischemia with an EF of 56% and was  overall low risk.  LAD and RCA calcifications were noted on CT imaging.  ? ?He comes in today and is doing reasonably well, without symptoms of angina or decompensation.  Since undergoing Lexiscan MPI he has doing well, with less palpitations as well.  When he does have palpitations, they typically occur in the afternoon hours.  He continues to be active without cardiac limitation and is tolerating medications.  He  will be undergoing a rotator cuff surgery next month with his primary cardiologist having already provided recommendations to hold Plavix.  Overall, he feels well currently.  ? ?RCRI: low risk for noncardiac surgery ?[facility] Activity Status Index: > 4 METs without cardiac limitation ? ? ?Labs independently reviewed: ?03/2022 - HGB 14.3, PLT 166, potassium 3.9, BUN 18, SCr 0.82, albumin 4.2, AST/ALT normal, TSH normal, TC 145, TG 104, HDL 47, LDL 77 ? ? ?Past Medical History:  ?Diagnosis Date  ? Acquired hypothyroidism   ? Coronary artery disease   ? a. cath 06/2019 s/p  orbital atherectomy and 2 overlapped DES to the mid and proximal LAD; angiopasty to 2nd dig  ? ED (erectile dysfunction)   ? Melanoma ([place])   ? Paroxysmal atrial fibrillation (HCC)   ? Diagnosed in 2004  ? Paroxysmal SVT (supraventricular tachycardia) (HCC)   ? Pure hypercholesterolemia   ? ? ?Past Surgical History:  ?Procedure Laterality Date  ? CORONARY ATHERECTOMY N/A 07/05/2019  ? Procedure: CORONARY ATHERECTOMY;  Surgeon: Wellington Hampshire, MD;  Location: [facility];  Service: Cardiovascular;  Laterality: N/A;  ? CORONARY BALLOON ANGIOPLASTY N/A 07/05/2019  ? Procedure: CORONARY BALLOON ANGIOPLASTY;  Surgeon: Wellington Hampshire, MD;  Location: [facility];  Service: Cardiovascular;  Laterality: N/A;  ? CORONARY STENT INTERVENTION N/A 07/05/2019  ? Procedure: CORONARY STENT INTERVENTION;  Surgeon: Wellington Hampshire, MD;  Location: [facility];  Service: Cardiovascular;  Laterality: N/A;  ? LEFT HEART CATH AND CORONARY ANGIOGRAPHY Left 06/26/2019  ? Procedure: LEFT HEART CATH AND CORONARY ANGIOGRAPHY;  Surgeon: Nelva Bush, MD;  Location: [facility];  Service: Cardiovascular;  Laterality: Left;  ? MELANOMA EXCISION  02/17/1983  ? NECK SURGERY    ? XI ROBOTIC ASSISTED INGUINAL HERNIA REPAIR WITH MESH Bilateral 04/18/2020  ? Procedure: XI ROBOTIC ASSISTED INGUINAL HERNIA REPAIR WITH MESH;  Surgeon: Jules Husbands, MD;   Location: [facility];  Service: General;  Laterality: Bilateral;  ? ? ?Current Medications: ?Current Meds  ?Medication Sig  ? acetaminophen (TYLENOL) 500 MG tablet Take 500 mg by mouth every 6 (six) hours as needed.  ? aspirin EC 81 MG tablet Take 81 mg by mouth daily.   ? cetirizine (ZYRTEC) 10 MG tablet Take 10 mg by mouth at bedtime.   ? clopidogrel (PLAVIX) 75 MG tablet Take 1 tablet (75 mg total) by mouth daily.  ? diltiazem (CARDIZEM CD) 120 MG 24 hr capsule Take 1 capsule (120 mg total) by mouth in the morning and at bedtime.  ? ezetimibe (ZETIA) 10 MG tablet TAKE 1 TABLET BY MOUTH ONCE DAILY IN THE EVENING  ? famotidine (PEPCID) 20 MG tablet Take 20 mg by mouth daily.  ? levothyroxine (SYNTHROID) 25 MCG tablet Take 1 tablet (25 mcg total) by mouth daily before breakfast.  ? nitroGLYCERIN (NITROSTAT) 0.4 MG SL tablet Place 1 tablet (0.4 mg total) under the tongue every 5 (five) minutes as needed for chest pain.  ? rosuvastatin (CRESTOR) 40 MG tablet Take 1 tablet by mouth once daily  ?  tadalafil (CIALIS) 20 MG tablet 1 tab 1 hour prior to intercourse  ? VITAMIN D, CHOLECALCIFEROL, PO Take by mouth daily.  ? [DISCONTINUED] diltiazem (CARDIZEM CD) 240 MG 24 hr capsule Take 1 capsule by mouth once daily  ? ? ?Allergies:   Hydrocodone-acetaminophen and Vicodin [hydrocodone-acetaminophen]  ? ?Social History  ? ?Socioeconomic History  ? Marital status: Married  ?  Spouse name: Morey Hummingbird   ? Number of children: 2  ? Years of education: Not on file  ? Highest education level: Not on file  ?Occupational History  ? Not on file  ?Tobacco Use  ? Smoking status: Never  ? Smokeless tobacco: Never  ?Vaping Use  ? Vaping Use: Never used  ?Substance and Sexual Activity  ? Alcohol use: Yes  ?  Comment: Occ.   ? Drug use: No  ? Sexual activity: Yes  ?  Birth control/protection: None  ?Other Topics Concern  ? Not on file  ?Social History Narrative  ? Not on file  ? ?Social Determinants of Health  ? ?Financial Resource Strain: Not on  file  ?Food Insecurity: Not on file  ?Transportation Needs: Not on file  ?Physical Activity: Not on file  ?Stress: Not on file  ?Social Connections: Not on file  ?  ? ?Family History:  ?The patient's famil

## 2022-04-22 ENCOUNTER — Ambulatory Visit: Payer: Medicare Other | Admitting: Physician Assistant

## 2022-04-22 ENCOUNTER — Encounter: Payer: Self-pay | Admitting: Physician Assistant

## 2022-04-22 VITALS — BP 140/70 | HR 70 | Ht 72.0 in | Wt 211.0 lb

## 2022-04-22 DIAGNOSIS — E785 Hyperlipidemia, unspecified: Secondary | ICD-10-CM | POA: Diagnosis not present

## 2022-04-22 DIAGNOSIS — E7849 Other hyperlipidemia: Secondary | ICD-10-CM

## 2022-04-22 DIAGNOSIS — I471 Supraventricular tachycardia: Secondary | ICD-10-CM

## 2022-04-22 DIAGNOSIS — I480 Paroxysmal atrial fibrillation: Secondary | ICD-10-CM | POA: Diagnosis not present

## 2022-04-22 DIAGNOSIS — Z01810 Encounter for preprocedural cardiovascular examination: Secondary | ICD-10-CM

## 2022-04-22 DIAGNOSIS — I2510 Atherosclerotic heart disease of native coronary artery without angina pectoris: Secondary | ICD-10-CM

## 2022-04-22 MED ORDER — DILTIAZEM HCL ER COATED BEADS 120 MG PO CP24: 120.0000 mg | ORAL_CAPSULE | Freq: Two times a day (BID) | ORAL | 3 refills | Status: DC

## 2022-04-22 NOTE — Patient Instructions (Signed)
Medication Instructions:  ?Your physician has recommended you make the following change in your medication:  ? ?STOP Cardizem CD 240 mg ?START Cardizem CD 120 mg twice a day ? ?*If you need a refill on your cardiac medications before your next appointment, please call your pharmacy* ? ? ?Lab Work: ?None ? ?If you have labs (blood work) drawn today and your tests are completely normal, you will receive your results only by: ?MyChart Message (if you have MyChart) OR ?A paper copy in the mail ?If you have any lab test that is abnormal or we need to change your treatment, we will call you to review the results. ? ? ?Testing/Procedures: ?None ? ? ?Follow-Up: ?At [facility], you and your health needs are our priority.  As part of our continuing mission to provide you with exceptional heart care, we have created designated Provider Care Teams.  These Care Teams include your primary Cardiologist (physician) and Advanced Practice Providers (APPs -  Physician Assistants and Nurse Practitioners) who all work together to provide you with the care you need, when you need it. ? ? ? ?Your next appointment:   ?6 month(s) ? ?The format for your next appointment:   ?In Person ? ?Provider:   ?Kathlyn Sacramento, MD or Christell Faith, PA-C  ? ? ? ? ? ? ?Important Information About Sugar ? ? ? ? ?  ?

## 2022-05-07 DIAGNOSIS — Z8669 Personal history of other diseases of the nervous system and sense organs: Secondary | ICD-10-CM | POA: Diagnosis not present

## 2022-05-07 DIAGNOSIS — H2513 Age-related nuclear cataract, bilateral: Secondary | ICD-10-CM | POA: Diagnosis not present

## 2022-05-07 DIAGNOSIS — H5213 Myopia, bilateral: Secondary | ICD-10-CM | POA: Diagnosis not present

## 2022-05-07 DIAGNOSIS — H35341 Macular cyst, hole, or pseudohole, right eye: Secondary | ICD-10-CM | POA: Diagnosis not present

## 2022-05-14 ENCOUNTER — Ambulatory Visit: Payer: Medicare Other | Admitting: Dermatology

## 2022-05-18 ENCOUNTER — Encounter: Payer: Self-pay | Admitting: Orthopedic Surgery

## 2022-05-18 ENCOUNTER — Other Ambulatory Visit: Payer: Self-pay | Admitting: Orthopedic Surgery

## 2022-05-18 DIAGNOSIS — Z01818 Encounter for other preprocedural examination: Secondary | ICD-10-CM

## 2022-05-18 NOTE — H&P (Signed)
NAME: [name] [name] MRN:   [id number] DOB:   01-Mar-1951     HISTORY AND PHYSICAL  CHIEF COMPLAINT:  right shoulder pain  HISTORY:   [name] Ruppertis a 71 y.o. male  with right  Shoulder Pain Patient complains of right shoulder pain. The symptoms began several weeks ago. Aggravating factors: no known event. Pain is located between the neck and shoulder. Discomfort is described as aching. Symptoms are exacerbated by repetitive movements. Evaluation to date: MRI: abnormal rotator cuff tear . Therapy to date includes: rest, ice, avoidance of offending activity, OTC analgesics which are somewhat effective, and home exercises which are somewhat effective.     Plan for Right SHOULDER ARTHROSCOPY WITH ROTATOR CUFF REPAIR   PAST MEDICAL HISTORY:   Past Medical History:  Diagnosis Date   Acquired hypothyroidism    Coronary artery disease    a. cath 06/2019 s/p  orbital atherectomy and 2 overlapped DES to the mid and proximal LAD; angiopasty to 2nd dig   ED (erectile dysfunction)    Melanoma ([place])    Paroxysmal atrial fibrillation ([place])    Diagnosed in 2004   Paroxysmal SVT (supraventricular tachycardia) ([place])    Pure hypercholesterolemia     PAST SURGICAL HISTORY:   Past Surgical History:  Procedure Laterality Date   CORONARY ATHERECTOMY N/A 07/05/2019   Procedure: CORONARY ATHERECTOMY;  Surgeon: Wellington Hampshire, MD;  Location: [facility];  Service: Cardiovascular;  Laterality: N/A;   CORONARY BALLOON ANGIOPLASTY N/A 07/05/2019   Procedure: CORONARY BALLOON ANGIOPLASTY;  Surgeon: Wellington Hampshire, MD;  Location: [facility];  Service: Cardiovascular;  Laterality: N/A;   CORONARY STENT INTERVENTION N/A 07/05/2019   Procedure: CORONARY STENT INTERVENTION;  Surgeon: Wellington Hampshire, MD;  Location: [facility];  Service: Cardiovascular;  Laterality: N/A;   LEFT HEART CATH AND CORONARY ANGIOGRAPHY Left 06/26/2019   Procedure: LEFT HEART CATH AND CORONARY ANGIOGRAPHY;  Surgeon:  Nelva Bush, MD;  Location: [facility];  Service: Cardiovascular;  Laterality: Left;   MELANOMA EXCISION  02/17/1983   NECK SURGERY     XI ROBOTIC ASSISTED INGUINAL HERNIA REPAIR WITH MESH Bilateral 04/18/2020   Procedure: XI ROBOTIC ASSISTED INGUINAL HERNIA REPAIR WITH MESH;  Surgeon: Jules Husbands, MD;  Location: [facility];  Service: General;  Laterality: Bilateral;    MEDICATIONS:  (Not in a hospital admission)   ALLERGIES:   Allergies  Allergen Reactions   Hydrocodone-Acetaminophen Nausea And Vomiting and Other (See Comments)   Vicodin [Hydrocodone-Acetaminophen] Nausea And Vomiting    REVIEW OF SYSTEMS:   Negative except HPI  FAMILY HISTORY:   Family History  Problem Relation Age of Onset   Atrial fibrillation Mother    Stroke Father    Heart disease Father     SOCIAL HISTORY:   reports that he has never smoked. He has never used smokeless tobacco. He reports current alcohol use. He reports that he does not use drugs.  PHYSICAL EXAM:  General appearance: alert, cooperative, and no distress    LABORATORY STUDIES: No results for input(s): "WBC", "HGB", "HCT", "PLT" in the last 72 hours.  No results for input(s): "NA", "K", "CL", "CO2", "GLUCOSE", "BUN", "CREATININE", "CALCIUM" in the last 72 hours.  STUDIES/RESULTS:  No results found.  ASSESSMENT:  Right shoulder impingement and rotator cuff tear        Active Problems:   * No active hospital problems. *    PLAN:  RIGHT SHOULDER ARTHROSCOPY WITH ROTATOR CUFF REPAIR  Carlynn Spry 05/18/2022. 9:39 AM

## 2022-05-25 ENCOUNTER — Encounter
Admission: RE | Admit: 2022-05-25 | Discharge: 2022-05-25 | Disposition: A | Discharge status: Home / Self Care | Payer: Medicare Other | Source: Ambulatory Visit | Attending: Orthopedic Surgery | Admitting: Orthopedic Surgery

## 2022-05-25 HISTORY — DX: Essential (primary) hypertension: I10

## 2022-05-25 HISTORY — DX: Gastro-esophageal reflux disease without esophagitis: K21.9

## 2022-05-25 HISTORY — DX: Cardiac arrhythmia, unspecified: I49.9

## 2022-05-25 NOTE — Patient Instructions (Addendum)
Your procedure is scheduled on: Wednesday June 03, 2022. Report to Day Surgery inside [facility] 2nd floor, stop by admissions desk before getting on elevator. To find out your arrival time please call [phone] between 1PM - 3PM on Tuesday June 02, 2022.  Remember: Instructions that are not followed completely may result in serious medical risk,  up to and including death, or upon the discretion of your surgeon and anesthesiologist your  surgery may need to be rescheduled.     _X__ 1. Do not eat food or drink fluids after midnight the night before your procedure.                 No chewing gum or hard candies.  __X__2.  On the morning of surgery brush your teeth with toothpaste and water, you                may rinse your mouth with mouthwash if you wish.  Do not swallow any toothpaste or mouthwash.     _X__ 3.  No Alcohol for 24 hours before or after surgery.   _X__ 4.  Do Not Smoke or use e-cigarettes For 24 Hours Prior to Your Surgery.                 Do not use any chewable tobacco products for at least 6 hours prior to                 Surgery.  _X__  5.  Do not use any recreational drugs (marijuana, cocaine, heroin, ecstasy, MDMA or other)                For at least one week prior to your surgery.  Combination of these drugs with anesthesia                May have life threatening results.  ____  6.  Bring all medications with you on the day of surgery if instructed.   __X_ 7.  Notify your doctor if there is any change in your medical condition      (cold, fever, infections).     Do not wear jewelry, make-up, hairpins, clips or nail polish. Do not wear lotions, powders, or perfumes. You may wear deodorant. Do not shave 48 hours prior to surgery. Men may shave face and neck. Do not bring valuables to the hospital.    [facility] is not responsible for any belongings or valuables.  Contacts, dentures or bridgework may not be worn into  surgery. Leave your suitcase in the car. After surgery it may be brought to your room. For patients admitted to the hospital, discharge time is determined by your treatment team.   Patients discharged the day of surgery will not be allowed to drive home.   Make arrangements for someone to be with you for the first 24 hours of your Same Day Discharge.   __X__ Take these medicines the morning of surgery with A SIP OF WATER:    1. levothyroxine (SYNTHROID) 25 MCG tablet  2. famotidine (PEPCID) 20 MG tablet  3. diltiazem (CARDIZEM CD) 120 MG  4.  5.  6.  ____ Fleet Enema (as directed)   __X__ Use CHG Soap (or wipes) as directed  ____ Use Benzoyl Peroxide Gel as instructed  ____ Use inhalers on the day of surgery  ____ Stop metformin 2 days prior to surgery    ____ Take 1/2 of usual insulin dose the night before surgery. No  insulin the morning          of surgery.   __X__ Stop clopidogrel (PLAVIX) 75 MG 5 days before your surgery. (05/28/22) as instructed by your doctor.  __X__ One Week prior to surgery- Stop Anti-inflammatories such as Ibuprofen, Aleve, Advil, Motrin, meloxicam (MOBIC), diclofenac, etodolac, ketorolac, Toradol, Daypro, piroxicam, Goody's or BC powders. OK TO USE TYLENOL IF NEEDED   __X__ Stop supplements until after surgery.    ____ Bring C-Pap to the hospital.    If you have any questions regarding your pre-procedure instructions,  Please call Pre-admit Testing at [phone]

## 2022-05-26 ENCOUNTER — Encounter
Admission: RE | Admit: 2022-05-26 | Discharge: 2022-05-26 | Disposition: A | Discharge status: Home / Self Care | Payer: Medicare Other | Source: Ambulatory Visit | Attending: Orthopedic Surgery | Admitting: Orthopedic Surgery

## 2022-05-26 DIAGNOSIS — Z01812 Encounter for preprocedural laboratory examination: Secondary | ICD-10-CM | POA: Insufficient documentation

## 2022-05-26 DIAGNOSIS — Z01818 Encounter for other preprocedural examination: Secondary | ICD-10-CM

## 2022-05-26 LAB — URINALYSIS, ROUTINE W REFLEX MICROSCOPIC
Bilirubin Urine: NEGATIVE
Glucose, UA: NEGATIVE mg/dL
Hgb urine dipstick: NEGATIVE
Ketones, ur: NEGATIVE mg/dL
Leukocytes,Ua: NEGATIVE
Nitrite: NEGATIVE
Protein, ur: NEGATIVE mg/dL
Specific Gravity, Urine: 1.017 (ref 1.005–1.030)
pH: 5 (ref 5.0–8.0)

## 2022-05-26 LAB — CBC
HCT: 43.5 % (ref 39.0–52.0)
Hemoglobin: 14.5 g/dL (ref 13.0–17.0)
MCH: 29.4 pg (ref 26.0–34.0)
MCHC: 33.3 g/dL (ref 30.0–36.0)
MCV: 88.2 fL (ref 80.0–100.0)
Platelets: 168 10*3/uL (ref 150–400)
RBC: 4.93 MIL/uL (ref 4.22–5.81)
RDW: 13.6 % (ref 11.5–15.5)
WBC: 4.8 10*3/uL (ref 4.0–10.5)
nRBC: 0 % (ref 0.0–0.2)

## 2022-05-26 LAB — BASIC METABOLIC PANEL
Anion gap: 7 (ref 5–15)
BUN: 21 mg/dL (ref 8–23)
CO2: 25 mmol/L (ref 22–32)
Calcium: 10.4 mg/dL — ABNORMAL HIGH (ref 8.9–10.3)
Chloride: 109 mmol/L (ref 98–111)
Creatinine, Ser: 0.75 mg/dL (ref 0.61–1.24)
GFR, Estimated: >60 mL/min (ref >60)
Glucose, Bld: 137 mg/dL — ABNORMAL HIGH (ref 70–99)
Potassium: 3.8 mmol/L (ref 3.5–5.1)
Sodium: 141 mmol/L (ref 135–145)

## 2022-05-28 ENCOUNTER — Other Ambulatory Visit: Payer: Self-pay | Admitting: Orthopedic Surgery

## 2022-05-28 ENCOUNTER — Encounter: Payer: Self-pay | Admitting: Orthopedic Surgery

## 2022-05-28 ENCOUNTER — Ambulatory Visit: Payer: Medicare Other | Admitting: Dermatology

## 2022-05-28 NOTE — Progress Notes (Incomplete)
Perioperative Services  Pre-Admission/Anesthesia Testing Clinical Review  Date: 05/28/22  Patient Demographics:  Name: [name] [name] DOB:   30-Aug-1951 MRN:   [id number]  Planned Surgical Procedure(s):    Case: [id number] Date/Time: 06/03/22 1431   Procedure: SHOULDER ARTHROSCOPY WITH ROTATOR CUFF REPAIR (SURG) (Right)   Anesthesia type: General   Pre-op diagnosis: M75.121 Complete rotatr-cuff tear/ruptr of r shoulder, not trauma   Location: [facility] OR ROOM 02 / [place] [facility]   Surgeons: Lovell Sheehan, MD   NOTE: Available PAT nursing documentation and vital signs have been reviewed. Clinical nursing staff has updated patient's PMH/PSHx, current medication list, and drug allergies/intolerances to ensure comprehensive history available to assist in medical decision making as it pertains to the aforementioned surgical procedure and anticipated anesthetic course. Extensive review of available clinical information performed. [name] [name] PMH and PSHx updated with any diagnoses/procedures that  may have been inadvertently omitted during his intake with the pre-admission testing department's nursing staff.  Clinical Discussion:  [name] [name] is a 71 y.o. male who is submitted for pre-surgical anesthesia review and clearance prior to him undergoing the above procedure. Patient has never been a smoker. Pertinent PMH includes: CAD, PAF, PSVT, aortic atherosclerosis, HTN, HLD, hypothyroidism, GERD (on daily H2 blocker), ED (on PDE5i), melanoma, elevated LFTs.  Patient is followed by cardiology Fletcher Anon, MD). He was last seen in the cardiology clinic on ***; notes reviewed. ***  ***Patient with a PMH significant for ***. CHA2DS2-VASc Score = ***. Patient chronically anticoagulated using ***; compliant with therapy with no evidence of GI bleeding. Last TTE performed on *** revealed normal left ventricular systolic function with an EF of ***%.  Subsequent myocardial perfusion imaging study  performed on ***revealed no evidence of stress-induced myocardial ischemia or arrhythmia (see full interpretation of cardiovascular testing below). Patient on GDMT for his HTN and HLD diagnoses.  Blood pressure well controlled at *** on currently prescribed *** therapies.  Patient is on a statin for his HLD. T2DM *** controlled on currently prescribed regimen; Hgb A1c ***% when last checked on ***. Functional capacity, as defined by [facility]AS[facility], is documented as being >/= 4 METS.  No changes were made to patient's medication regimen.  Patient to follow-up with outpatient cardiology in *** months or sooner if needed.  [name] [name] is scheduled for an elective RIGHT SHOULDER ARTHROSCOPY WITH ROTATOR CUFF REPAIR on 06/03/2022 with Dr. Kurtis Bushman, MD.  Given patient's past medical history significant for cardiovascular diagnoses, presurgical cardiac clearance was sought by the PAT team.  Per cardiology, "per [facility]CRI, he is low risk for noncardiac surgery.  Per [facility]ASI, he can achieve > 4 METs without cardiac limitation. He may proceed with noncardiac surgery at an overall LOW risk without further cardiac testing". Again, this patient is on daily DAPT therapy.  He has been instructed on recommendations from his cardiologist for holding his Coumadin for 5 days prior to his procedure with plans to restart as soon as postoperative bleeding respectively minimized by his primary attending surgeon.  The patient is aware that his last dose of clopidogrel should be on 05/28/2022.  Patient will continue his daily low-dose ASA throughout his perioperative course.  Patient denies previous perioperative complications with anesthesia in the past. In review of the available records, it is noted that patient underwent a general anesthetic course here at [facility] (ASA III) in 04/2020 without documented complications.      05/25/2022    2:45 PM 04/22/2022  2:11 PM 03/19/2022    3:36 PM  Vitals with  BMI  Height '6\' 0"'$  '6\' 0"'$  '6\' 0"'$   Weight 202 lbs 211 lbs 212 lbs 4 oz  BMI 27.39 56.81 27.51  Systolic  700 174  Diastolic  70 58  Pulse  70 72    Providers/Specialists:   NOTE: Primary physician provider listed below. Patient may have been seen by APP or partner within same practice.   PROVIDER ROLE / SPECIALTY LAST OV  Lovell Sheehan, MD Orthopedics (Surgeon) 05/18/2022  Gwyneth Sprout, FNP Primary Care Provider 11/12/2021  Kathlyn Sacramento, MD Cardiology 04/22/2022   Allergies:  Hydrocodone-acetaminophen and Vicodin [hydrocodone-acetaminophen]  Current Home Medications:   No current facility-administered medications for this encounter.    acetaminophen (TYLENOL) 500 MG tablet   aspirin EC 81 MG tablet   cetirizine (ZYRTEC) 10 MG tablet   clopidogrel (PLAVIX) 75 MG tablet   diltiazem (CARDIZEM CD) 120 MG 24 hr capsule   ezetimibe (ZETIA) 10 MG tablet   famotidine (PEPCID) 20 MG tablet   levothyroxine (SYNTHROID) 25 MCG tablet   nitroGLYCERIN (NITROSTAT) 0.4 MG SL tablet   rosuvastatin (CRESTOR) 40 MG tablet   tadalafil (CIALIS) 20 MG tablet   VITAMIN D, CHOLECALCIFEROL, PO   History:   Past Medical History:  Diagnosis Date   Acquired hypothyroidism    Aortic atherosclerosis (HCC)    Coronary artery disease    a.) LHC 06/26/2019: EF 55-60%; LVEFP 25-30%; 80 pLAD, 40% mLAD, 40% p-mLCx, 30% mRCA; staged PCI placed. b.) LHC/staged PCI 07/05/2019: 55% mLAD, 40% p-mLCx, 30% mRCA, 85% p-mLAD, 50% D2 --> PCI performed placing overlapping 3.0 x 8 mm (pLAD) and 3.0 x 30 mm (mLAD) Resolute Onyx DES x 2   Diverticulosis    ED (erectile dysfunction)    a.) on PDE5i (tadalifil)   Elevated LFTs    GERD (gastroesophageal reflux disease)    HLD (hyperlipidemia)    Hypertension    Long term current use of antithrombotics/antiplatelets    a.) daily DAPT therapy (ASA + clopidogrel)   Melanoma (HCC)    PAF (paroxysmal atrial fibrillation) ([place]) 2004   a.) CHA2DS2-VASc = 3 (age,  HTN, aortic plaque). b.) rate/rhythm maintained on oral diltiazem; on daily DAPT (ASA + clopidogrel)   Paroxysmal SVT (supraventricular tachycardia) (HCC)    Pure hypercholesterolemia    Past Surgical History:  Procedure Laterality Date   CORONARY ATHERECTOMY N/A 07/05/2019   Procedure: CORONARY ATHERECTOMY;  Surgeon: Wellington Hampshire, MD;  Location: [place] [facility];  Service: Cardiovascular;  Laterality: N/A;   CORONARY BALLOON ANGIOPLASTY N/A 07/05/2019   Procedure: CORONARY BALLOON ANGIOPLASTY;  Surgeon: Wellington Hampshire, MD;  Location: [place] [facility];  Service: Cardiovascular;  Laterality: N/A;   CORONARY STENT INTERVENTION N/A 07/05/2019   Procedure: CORONARY STENT INTERVENTION;  Surgeon: Wellington Hampshire, MD;  Location: [place] [facility];  Service: Cardiovascular;  Laterality: N/A;   EYE SURGERY Right 2014   Retina detachment   LEFT HEART CATH AND CORONARY ANGIOGRAPHY Left 06/26/2019   Procedure: LEFT HEART CATH AND CORONARY ANGIOGRAPHY;  Surgeon: Nelva Bush, MD;  Location: [place] [facility];  Service: Cardiovascular;  Laterality: Left;   MELANOMA EXCISION  02/17/1983   NECK SURGERY  2004   XI ROBOTIC ASSISTED INGUINAL HERNIA REPAIR WITH MESH Bilateral 04/18/2020   Procedure: XI ROBOTIC ASSISTED INGUINAL HERNIA REPAIR WITH MESH;  Surgeon: Jules Husbands, MD;  Location: [facility] ORS;  Service: General;  Laterality: Bilateral;  Family History  Problem Relation Age of Onset   Atrial fibrillation Mother    Stroke Father    Heart disease Father    Social History   Tobacco Use   Smoking status: Never   Smokeless tobacco: Never  Vaping Use   Vaping Use: Never used  Substance Use Topics   Alcohol use: Yes    Comment: Occ.    Drug use: No    Pertinent Clinical Results:  LABS: Labs reviewed: Acceptable for surgery.  Hospital Outpatient Visit on 05/26/2022  Component Date Value Ref Range Status   WBC 05/26/2022 4.8  4.0 - 10.5 K/uL Final   RBC 05/26/2022 4.93   4.22 - 5.81 MIL/uL Final   Hemoglobin 05/26/2022 14.5  13.0 - 17.0 g/dL Final   HCT 05/26/2022 43.5  39.0 - 52.0 % Final   MCV 05/26/2022 88.2  80.0 - 100.0 fL Final   MCH 05/26/2022 29.4  26.0 - 34.0 pg Final   MCHC 05/26/2022 33.3  30.0 - 36.0 g/dL Final   RDW 05/26/2022 13.6  11.5 - 15.5 % Final   Platelets 05/26/2022 168  150 - 400 K/uL Final   nRBC 05/26/2022 0.0  0.0 - 0.2 % Final   Performed at [facility], [place], [place] [address]   Sodium 05/26/2022 141  135 - 145 mmol/L Final   Potassium 05/26/2022 3.8  3.5 - 5.1 mmol/L Final   Chloride 05/26/2022 109  98 - 111 mmol/L Final   CO2 05/26/2022 25  22 - 32 mmol/L Final   Glucose, Bld 05/26/2022 137 (H)  70 - 99 mg/dL Final   Glucose reference range applies only to samples taken after fasting for at least 8 hours.   BUN 05/26/2022 21  8 - 23 mg/dL Final   Creatinine, Ser 05/26/2022 0.75  0.61 - 1.24 mg/dL Final   Calcium 05/26/2022 10.4 (H)  8.9 - 10.3 mg/dL Final   GFR, Estimated 05/26/2022 >60  >60 mL/min Final   Comment: (NOTE) Calculated using the CKD-EPI Creatinine Equation (2021)    Anion gap 05/26/2022 7  5 - 15 Final   Performed at [facility], [place], [place] [address]   Color, Urine 05/26/2022 YELLOW (A)  YELLOW Final   APPearance 05/26/2022 CLEAR (A)  CLEAR Final   Specific Gravity, Urine 05/26/2022 1.017  1.005 - 1.030 Final   pH 05/26/2022 5.0  5.0 - 8.0 Final   Glucose, UA 05/26/2022 NEGATIVE  NEGATIVE mg/dL Final   Hgb urine dipstick 05/26/2022 NEGATIVE  NEGATIVE Final   Bilirubin Urine 05/26/2022 NEGATIVE  NEGATIVE Final   Ketones, ur 05/26/2022 NEGATIVE  NEGATIVE mg/dL Final   Protein, ur 05/26/2022 NEGATIVE  NEGATIVE mg/dL Final   Nitrite 05/26/2022 NEGATIVE  NEGATIVE Final   Leukocytes,Ua 05/26/2022 NEGATIVE  NEGATIVE Final   Performed at [facility], [address] [place], [place] [address]    ECG: Date: 03/19/2022 Time ECG  obtained: 1543 PM Rate: 72 bpm Rhythm: normal sinus Axis (leads I and aVF): Normal Intervals: PR 174 ms. QRS 86 ms. QTc 433 ms. ST segment and T wave changes: No evidence of acute ST segment elevation or depression Comparison: Similar to previous tracing obtained on 07/16/2021   IMAGING / PROCEDURES: MYOCARDIAL PERFUSION IMAGING STUDY (LEXISCAN) performed on 03/25/2022 Normal ventricular systolic function with EF 56% LAD and RCA calcifications noted No evidence for ischemia Low risk study  LEFT HEART CATHETERIZATION; CORONARY BALLOON ANGIOPLASTY, STENT INTERVENTION, ATHERECTOMY performed on  07/05/2019 Multivessel CAD 55% mid LAD Left proximal to mid LCx 30% mid RCA 85% proximal to mid LAD 50% D2 Complex orbital atherectomy performed Successful PCI Overlapping 3.0 x 8 mm (proximal LAD) and 3.0 x 30 mm) resolute Onyx DES x2 placed Recommendations: DAPT therapy for at least 1 year.  Aggressive treatment of risk factors.   LEFT HEART CATHETERIZATION AND CORONARY ANGIOGRAPHY performed on 06/26/2019 Normal left ventricular systolic function with EF of 55 to 60% LVEDP elevated at 25-30 mmHg Severe multivessel CAD 80% proximal LAD 40% mid LAD 40% proximal to mid LCx 30% mid RCA Recommendations/plan:  Staged PCI at [facility] using atherectomy DAPT therapy with aspirin and clopidogrel.  Long-term, patient will transition to NOAC + clopidogrel will need to be considered with his history of PAF Aggressive secondary prevention    CT CORONARY MORPH W/CTA COR W/SCORE W/CA W/CM &/OR WO/CM performed on 06/16/2019 Coronary calcium score of 1333. This was 51 percentile for age and sex matched control. Normal coronary origin with right dominance. Proximal LAD has a long, complex, severe, predominantly calcified plaque with stenosis 50-69%, but stenosis > 70% can't be excluded. Aggressive medical management is recommended FFR-CT analysis showed severe stenosis of the proximal LAD, mid RCA,  mid LCx arteries.  Cardiac catheterization was recommended. Left Main: 0.98. LAD: Ostial: 0.98, proximal: 0.50. LCX: Proximal: 0.95, mid: 078. OM1: 0.77. RCA: Proximal: 0.98, mid: 0.82, PDA: 0.79.  TRANSTHORACIC ECHOCARDIOGRAM performed on 05/04/2019 The left ventricle has normal systolic function, with an ejection fraction of 55-60%. The cavity size was normal. There is mild concentric left ventricular hypertrophy. Left ventricular diastolic parameters were normal.  The right ventricle has normal systolic function. The cavity was normal. There is no increase in right ventricular wall thickness.  The mitral valve is grossly normal.  The tricuspid valve is grossly normal.  The aortic valve has an indeterminate number of cusps. Aortic valve regurgitation is mild by color flow Doppler.   Impression and Plan:  [name] [name] has been referred for pre-anesthesia review and clearance prior to him undergoing the planned anesthetic and procedural courses. Available labs, pertinent testing, and imaging results were personally reviewed by me. This patient has been appropriately cleared by cardiology with an overall LOW risk of significant perioperative cardiovascular complications.  Based on clinical review performed today (05/28/22), barring any significant acute changes in the patient's overall condition, it is anticipated that he will be able to proceed with the planned surgical intervention. Any acute changes in clinical condition may necessitate his procedure being postponed and/or cancelled. Patient will meet with anesthesia team (MD and/or CRNA) on the day of his procedure for preoperative evaluation/assessment. Questions regarding anesthetic course will be fielded at that time.   Pre-surgical instructions were reviewed with the patient during his PAT appointment and questions were fielded by PAT clinical staff. Patient was advised that if any questions or concerns arise prior to his procedure then  he should return a call to PAT and/or his surgeon's office to discuss.  Honor Loh, MSN, APRN, FNP-C, CEN [facility]  Peri-operative Services Nurse Practitioner Phone: [phone] Fax: [phone] 05/28/22 1:16 PM  NOTE: This note has been prepared using Dragon dictation software. Despite my best ability to proofread, there is always the potential that unintentional transcriptional errors may still occur from this process.

## 2022-06-01 ENCOUNTER — Other Ambulatory Visit: Payer: Self-pay | Admitting: Family Medicine

## 2022-06-01 ENCOUNTER — Ambulatory Visit: Payer: Self-pay

## 2022-06-01 ENCOUNTER — Encounter: Payer: Self-pay | Admitting: Orthopedic Surgery

## 2022-06-01 DIAGNOSIS — U071 COVID-19: Secondary | ICD-10-CM

## 2022-06-01 MED ORDER — MOLNUPIRAVIR EUA 200MG CAPSULE: 4.0000 | ORAL_CAPSULE | Freq: Two times a day (BID) | ORAL | 0 refills | Status: DC

## 2022-06-01 NOTE — Telephone Encounter (Signed)
Summary: COVID Positive   Pt stated slight cold symptoms started late Saturday night expiriencing cough.  Pt stated that he took a test and tested positive for COVID. Pt stated that he has heart conditions, and Paxlovid is not a good antiviral. Pt stated this is not the first time he has COVID.   Pt seeking clinical advice.      Chief Complaint: covid + requesting antiviral, Symptoms: chest congestion, cough,headache Sx started Sat 05/30/22 Frequency: see above Pertinent Negatives: Patient denies fever, SOB Disposition: [] ED /[] Urgent Care (no appt availability in office) / [x] Appointment(In office/virtual)/ []  [facility]/ [] Home Care/ [] Refused Recommended Disposition /[] [facility]/ []  Follow-up with PCP Additional Notes: in office virtual visit. Pt stated had video visit last August and was prescribed molnupiravir. Pt stated the provider ordered that for him at that visit because of his heart issue.  Reason for Disposition  [1] HIGH RISK for severe COVID complications (e.g., weak immune system, age > 64 years, obesity with BMI > 25, pregnant, chronic lung disease or other chronic medical condition) AND [2] COVID symptoms (e.g., cough, fever)  (Exceptions: Already seen by PCP and no new or worsening symptoms.)  Answer Assessment - Initial Assessment Questions 1. COVID-19 DIAGNOSIS: "Who made your COVID-19 diagnosis?" "Was it confirmed by a positive lab test or self-test?" If not diagnosed by a doctor (or NP/PA), ask "Are there lots of cases (community spread) where you live?" Note: See public health department website, if unsure.     Self test 2. COVID-19 EXPOSURE: "Was there any known exposure to COVID before the symptoms began?" CDC Definition of close contact: within 6 feet (2 meters) for a total of 15 minutes or more over a 24-hour period.      no 3. ONSET: "When did the COVID-19 symptoms start?"      Sat night 4. WORST SYMPTOM: "What is your worst symptom?"  (e.g., cough, fever, shortness of breath, muscle aches)     congestion 5. COUGH: "Do you have a cough?" If Yes, ask: "How bad is the cough?"       yes 6. FEVER: "Do you have a fever?" If Yes, ask: "What is your temperature, how was it measured, and when did it start?"     no 7. RESPIRATORY STATUS: "Describe your breathing?" (e.g., shortness of breath, wheezing, unable to speak)      none 8. BETTER-SAME-WORSE: "Are you getting better, staying the same or getting worse compared to yesterday?"  If getting worse, ask, "In what way?"     worse 9. HIGH RISK DISEASE: "Do you have any chronic medical problems?" (e.g., asthma, heart or lung disease, weak immune system, obesity, etc.)     Heart problems 10. VACCINE: "Have you had the COVID-19 vaccine?" If Yes, ask: "Which one, how many shots, when did you get it?"       4 vax 11. BOOSTER: "Have you received your COVID-19 booster?" If Yes, ask: "Which one and when did you get it?"       Moderna 13. OTHER SYMPTOMS: "Do you have any other symptoms?"  (e.g., chills, fatigue, headache, loss of smell or taste, muscle pain, sore throat)       Chest congestion, cough, headache 14. O2 SATURATION MONITOR:  "Do you use an oxygen saturation monitor (pulse oximeter) at home?" If Yes, ask "What is your reading (oxygen level) today?" "What is your usual oxygen saturation reading?" (e.g., 95%)       N/a  Protocols used:  Coronavirus (COVID-19) Diagnosed or Suspected-A-AH

## 2022-06-02 ENCOUNTER — Telehealth (INDEPENDENT_AMBULATORY_CARE_PROVIDER_SITE_OTHER): Payer: Medicare Other | Admitting: Family Medicine

## 2022-06-02 ENCOUNTER — Encounter: Payer: Self-pay | Admitting: Family Medicine

## 2022-06-02 ENCOUNTER — Ambulatory Visit: Payer: Medicare Other | Admitting: Dermatology

## 2022-06-02 VITALS — Ht 70.0 in | Wt 205.0 lb

## 2022-06-02 DIAGNOSIS — U071 COVID-19: Secondary | ICD-10-CM | POA: Diagnosis not present

## 2022-06-02 MED ORDER — MOLNUPIRAVIR EUA 200MG CAPSULE: 4.0000 | ORAL_CAPSULE | Freq: Two times a day (BID) | ORAL | 0 refills | Status: AC

## 2022-06-17 ENCOUNTER — Ambulatory Visit: Payer: Medicare Other | Admitting: Dermatology

## 2022-06-23 ENCOUNTER — Other Ambulatory Visit: Payer: Self-pay | Admitting: Orthopedic Surgery

## 2022-06-23 ENCOUNTER — Encounter: Payer: Self-pay | Admitting: Orthopedic Surgery

## 2022-06-23 NOTE — H&P (Signed)
NAME: [name] [name] MRN:   [id number] DOB:   02/05/1951     HISTORY AND PHYSICAL  CHIEF COMPLAINT:  Right shoulder pain  HISTORY:   [name] [name] a 71 y.o. male  with complaints of right shoulder pain. The symptoms began several weeks ago. Aggravating factors: no known event. Pain is located between the neck and shoulder. Discomfort is described as aching. Symptoms are exacerbated by repetitive movements. Evaluation to date: MRI: abnormal rotator cuff tear . Therapy to date includes: rest, ice, avoidance of offending activity, OTC analgesics which are somewhat effective, and home exercises which are somewhat effective.     Plan for Right SHOULDER ARTHROSCOPY WITH ROTATOR CUFF REPAIR   PAST MEDICAL HISTORY:   Past Medical History:  Diagnosis Date   Acquired hypothyroidism    Aortic atherosclerosis ([place])    Coronary artery disease    a.) LHC 06/26/2019: EF 55-60%; LVEDP 25-30%; 80 pLAD, 40% mLAD, 40% p-mLCx, 30% mRCA; staged PCI planned. b.) LHC/staged PCI 07/05/2019: 55% mLAD, 40% p-mLCx, 30% mRCA, 85% p-mLAD, 50% D2 --> orbital atherectomy and PCI performed placing overlapping 3.0 x 8 mm (pLAD) and 3.0 x 30 mm (mLAD) Resolute Onyx DES x 2   Diverticulosis    ED (erectile dysfunction)    a.) on PDE5i (tadalifil)   Elevated LFTs    GERD (gastroesophageal reflux disease)    HLD (hyperlipidemia)    Hypertension    Long term current use of antithrombotics/antiplatelets    a.) daily DAPT therapy (ASA + clopidogrel)   Melanoma (HCC)    PAF (paroxysmal atrial fibrillation) ([place]) 2004   a.) CHA2DS2-VASc = 3 (age, HTN, aortic plaque). b.) rate/rhythm maintained on oral diltiazem; on daily DAPT (ASA + clopidogrel)   Paroxysmal SVT (supraventricular tachycardia) (HCC)    Pure hypercholesterolemia     PAST SURGICAL HISTORY:   Past Surgical History:  Procedure Laterality Date   CORONARY ATHERECTOMY N/A 07/05/2019   Procedure: CORONARY ATHERECTOMY;  Surgeon: Wellington Hampshire, MD;  Location:  [place] [facility];  Service: Cardiovascular;  Laterality: N/A;   CORONARY BALLOON ANGIOPLASTY N/A 07/05/2019   Procedure: CORONARY BALLOON ANGIOPLASTY;  Surgeon: Wellington Hampshire, MD;  Location: [place] [facility];  Service: Cardiovascular;  Laterality: N/A;   CORONARY STENT INTERVENTION N/A 07/05/2019   Procedure: CORONARY STENT INTERVENTION;  Surgeon: Wellington Hampshire, MD;  Location: [place] [facility];  Service: Cardiovascular;  Laterality: N/A;   EYE SURGERY Right 2014   Retina detachment   LEFT HEART CATH AND CORONARY ANGIOGRAPHY Left 06/26/2019   Procedure: LEFT HEART CATH AND CORONARY ANGIOGRAPHY;  Surgeon: Nelva Bush, MD;  Location: [place] [facility];  Service: Cardiovascular;  Laterality: Left;   MELANOMA EXCISION  02/17/1983   NECK SURGERY  2004   XI ROBOTIC ASSISTED INGUINAL HERNIA REPAIR WITH MESH Bilateral 04/18/2020   Procedure: XI ROBOTIC ASSISTED INGUINAL HERNIA REPAIR WITH MESH;  Surgeon: Jules Husbands, MD;  Location: [facility]S;  Service: General;  Laterality: Bilateral;    MEDICATIONS:  (Not in a hospital admission)   ALLERGIES:   Allergies  Allergen Reactions   Hydrocodone-Acetaminophen Nausea And Vomiting and Other (See Comments)   Vicodin [Hydrocodone-Acetaminophen] Nausea And Vomiting    REVIEW OF SYSTEMS:   Negative except HPI  FAMILY HISTORY:   Family History  Problem Relation Age of Onset   Atrial fibrillation Mother    Stroke Father    Heart disease Father     SOCIAL HISTORY:   reports that he has  never smoked. He has never used smokeless tobacco. He reports current alcohol use. He reports that he does not use drugs.  PHYSICAL EXAM:  General appearance: alert, cooperative, and no distress Neck: no JVD and supple, symmetrical, trachea midline Resp: clear to auscultation bilaterally Cardio: regular rate and rhythm, S1, S2 normal, no murmur, click, rub or gallop GI: soft, non-tender; bowel sounds normal; no masses,  no  organomegaly Extremities: extremities normal, atraumatic, no cyanosis or edema Pulses: 2+ and symmetric Skin: Skin color, texture, turgor normal. No rashes or lesions Neurologic: Alert and oriented X 3, normal strength and tone. Normal symmetric reflexes. Normal coordination and gait    LABORATORY STUDIES: No results for input(s): "WBC", "HGB", "HCT", "PLT" in the last 72 hours.  No results for input(s): "NA", "K", "CL", "CO2", "GLUCOSE", "BUN", "CREATININE", "CALCIUM" in the last 72 hours.  STUDIES/RESULTS:  No results found.  ASSESSMENT: Right shoulder rotator cuff tear and impingement        Active Problems:   * No active hospital problems. *    PLAN:  Right Shoulder arthroscopy and rotator cuff repair   Carlynn Spry 06/23/2022. 11:09 AM

## 2022-06-25 NOTE — Patient Instructions (Addendum)
Your procedure is scheduled on: 07/07/22 - Tuesday Report to the Registration Desk on the 1st floor of the [facility]. To find out your arrival time, please call [phone] between 1PM - 3PM on: 07/06/22 - Monday If your arrival time is 6:00 am, do not arrive prior to that time as the [facility] entrance doors do not open until 6:00 am.  REMEMBER: Instructions that are not followed completely may result in serious medical risk, up to and including death; or upon the discretion of your surgeon and anesthesiologist your surgery may need to be rescheduled.  Do not eat food or drink any fluids after midnight the night before surgery.  No gum chewing, lozengers or hard candies.  TAKE  ONLY THESE MEDICATIONS THE MORNING OF SURGERY WITH A SIP OF WATER:  - diltiazem (CARDIZEM CD) - famotidine (PEPCID) - (take one the night before and one on the morning of surgery - helps to prevent nausea after surgery.) - levothyroxine (SYNTHROID)  Follow recommendations from Cardiologist, Pulmonologist or PCP regarding stopping Aspirin, Coumadin, Plavix, Eliquis, Pradaxa, or Pletal. Stop taking Plavix beginning 07/02/22, continue your Aspirin daily but do not take on the day of surgery.  One week prior to surgery: Stop Anti-inflammatories (NSAIDS) such as Advil, Aleve, Ibuprofen, Motrin, Naproxen, Naprosyn and Aspirin based products such as Excedrin, Goodys Powder, BC Powder.  Stop ANY OVER THE COUNTER supplements until after surgery.  You may take Tylenol if needed for pain up until the day of surgery.  No Alcohol for 24 hours before or after surgery.  No Smoking including e-cigarettes for 24 hours prior to surgery.  No chewable tobacco products for at least 6 hours prior to surgery.  No nicotine patches on the day of surgery.  Do not use any "recreational" drugs for at least a week prior to your surgery.  Please be advised that the combination of cocaine and anesthesia may have negative outcomes,  up to and including death. If you test positive for cocaine, your surgery will be cancelled.  On the morning of surgery brush your teeth with toothpaste and water, you may rinse your mouth with mouthwash if you wish. Do not swallow any toothpaste or mouthwash.  Use CHG Soap or wipes as directed on instruction sheet.  Do not wear jewelry, make-up, hairpins, clips or nail polish.  Do not wear lotions, powders, or perfumes.   Do not shave body from the neck down 48 hours prior to surgery just in case you cut yourself which could leave a site for infection.  Also, freshly shaved skin may become irritated if using the CHG soap.  Contact lenses, hearing aids and dentures may not be worn into surgery.  Do not bring valuables to the hospital. [facility] is not responsible for any missing/lost belongings or valuables.   Notify your doctor if there is any change in your medical condition (cold, fever, infection).  Wear comfortable clothing (specific to your surgery type) to the hospital.  After surgery, you can help prevent lung complications by doing breathing exercises.  Take deep breaths and cough every 1-2 hours. Your doctor may order a device called an Incentive Spirometer to help you take deep breaths. When coughing or sneezing, hold a pillow firmly against your incision with both hands. This is called "splinting." Doing this helps protect your incision. It also decreases belly discomfort.  If you are being admitted to the hospital overnight, leave your suitcase in the car. After surgery it may be brought to  your room.  If you are being discharged the day of surgery, you will not be allowed to drive home. You will need a responsible adult (18 years or older) to drive you home and stay with you that night.   If you are taking public transportation, you will need to have a responsible adult (18 years or older) with you. Please confirm with your physician that it is acceptable to use  public transportation.   Please call the [facility] at [phone] if you have any questions about these instructions.  Surgery Visitation Policy:  Patients undergoing a surgery or procedure may have two family members or support persons with them as long as the person is not COVID-19 positive or experiencing its symptoms.   Inpatient Visitation:    Visiting hours are 7 a.m. to 8 p.m. Up to four visitors are allowed at one time in a patient room, including children. The visitors may rotate out with other people during the day. One designated support person (adult) may remain overnight.

## 2022-06-26 ENCOUNTER — Encounter
Admission: RE | Admit: 2022-06-26 | Discharge: 2022-06-26 | Disposition: A | Discharge status: Home / Self Care | Payer: Medicare Other | Source: Ambulatory Visit | Attending: Orthopedic Surgery | Admitting: Orthopedic Surgery

## 2022-06-26 NOTE — Pre-Procedure Instructions (Signed)
Reviewed patients pre - op instructions, no medication changes , patient has no questions at this time.

## 2022-07-07 ENCOUNTER — Encounter: Payer: Self-pay | Admitting: Orthopedic Surgery

## 2022-07-07 ENCOUNTER — Other Ambulatory Visit: Payer: Self-pay

## 2022-07-07 ENCOUNTER — Ambulatory Visit: Payer: Medicare Other | Admitting: Urgent Care

## 2022-07-07 ENCOUNTER — Ambulatory Visit
Admission: RE | Admit: 2022-07-07 | Discharge: 2022-07-07 | Disposition: A | Discharge status: Home / Self Care | Payer: Medicare Other | Attending: Orthopedic Surgery | Admitting: Orthopedic Surgery

## 2022-07-07 ENCOUNTER — Encounter
Admission: RE | Disposition: A | Discharge status: Home / Self Care | Payer: Self-pay | Source: Home / Self Care | Attending: Orthopedic Surgery

## 2022-07-07 ENCOUNTER — Ambulatory Visit: Payer: Medicare Other

## 2022-07-07 DIAGNOSIS — I10 Essential (primary) hypertension: Secondary | ICD-10-CM | POA: Diagnosis not present

## 2022-07-07 DIAGNOSIS — Z955 Presence of coronary angioplasty implant and graft: Secondary | ICD-10-CM | POA: Insufficient documentation

## 2022-07-07 DIAGNOSIS — E039 Hypothyroidism, unspecified: Secondary | ICD-10-CM | POA: Insufficient documentation

## 2022-07-07 DIAGNOSIS — I2510 Atherosclerotic heart disease of native coronary artery without angina pectoris: Secondary | ICD-10-CM | POA: Insufficient documentation

## 2022-07-07 DIAGNOSIS — M19011 Primary osteoarthritis, right shoulder: Secondary | ICD-10-CM | POA: Diagnosis not present

## 2022-07-07 DIAGNOSIS — M7551 Bursitis of right shoulder: Secondary | ICD-10-CM | POA: Diagnosis not present

## 2022-07-07 DIAGNOSIS — M75121 Complete rotator cuff tear or rupture of right shoulder, not specified as traumatic: Secondary | ICD-10-CM | POA: Diagnosis not present

## 2022-07-07 HISTORY — DX: Hyperlipidemia, unspecified: E78.5

## 2022-07-07 HISTORY — DX: Other specified abnormal findings of blood chemistry: R79.89

## 2022-07-07 HISTORY — DX: Diverticulosis of intestine, part unspecified, without perforation or abscess without bleeding: K57.90

## 2022-07-07 HISTORY — DX: Atherosclerosis of aorta: I70.0

## 2022-07-07 HISTORY — PX: SHOULDER ARTHROSCOPY WITH SUBACROMIAL DECOMPRESSION, ROTATOR CUFF REPAIR AND BICEP TENDON REPAIR: SHX5687

## 2022-07-07 HISTORY — DX: Long term (current) use of antithrombotics/antiplatelets: Z79.02

## 2022-07-07 SURGERY — SHOULDER ARTHROSCOPY WITH SUBACROMIAL DECOMPRESSION, ROTATOR CUFF REPAIR AND BICEP TENDON REPAIR
Anesthesia: General | Site: Shoulder | Laterality: Right

## 2022-07-07 MED ORDER — DEXAMETHASONE SODIUM PHOSPHATE 10 MG/ML IJ SOLN
INTRAMUSCULAR | Status: DC | PRN
  Administered 2022-07-07: 10 mg via INTRAVENOUS

## 2022-07-07 MED ORDER — LIDOCAINE HCL (PF) 1 % IJ SOLN
INTRAMUSCULAR | Status: DC | PRN
  Administered 2022-07-07: 2 mL via SUBCUTANEOUS

## 2022-07-07 MED ORDER — PROPOFOL 10 MG/ML IV BOLUS
INTRAVENOUS | Status: AC
  Filled 2022-07-07: qty 20

## 2022-07-07 MED ORDER — EPINEPHRINE PF 1 MG/ML IJ SOLN
INTRAMUSCULAR | Status: AC
  Filled 2022-07-07: qty 4

## 2022-07-07 MED ORDER — ROCURONIUM BROMIDE 100 MG/10ML IV SOLN
INTRAVENOUS | Status: DC | PRN
  Administered 2022-07-07: 20 mg via INTRAVENOUS
  Administered 2022-07-07: 30 mg via INTRAVENOUS

## 2022-07-07 MED ORDER — CHLORHEXIDINE GLUCONATE 0.12 % MT SOLN: 15.0000 mL | Freq: Once | OROMUCOSAL | Status: AC

## 2022-07-07 MED ORDER — LACTATED RINGERS IR SOLN
Status: DC | PRN
  Administered 2022-07-07: 12000 mL

## 2022-07-07 MED ORDER — CHLORHEXIDINE GLUCONATE 0.12 % MT SOLN
OROMUCOSAL | Status: AC
  Administered 2022-07-07: 15 mL via OROMUCOSAL
  Filled 2022-07-07: qty 15

## 2022-07-07 MED ORDER — MIDAZOLAM HCL 2 MG/2ML IJ SOLN
INTRAMUSCULAR | Status: AC
  Administered 2022-07-07: 1.5 mg via INTRAVENOUS
  Filled 2022-07-07: qty 2

## 2022-07-07 MED ORDER — LIDOCAINE HCL (PF) 2 % IJ SOLN
INTRAMUSCULAR | Status: AC
  Filled 2022-07-07: qty 5

## 2022-07-07 MED ORDER — FENTANYL CITRATE (PF) 100 MCG/2ML IJ SOLN
INTRAMUSCULAR | Status: AC
  Filled 2022-07-07: qty 2

## 2022-07-07 MED ORDER — LIDOCAINE HCL (CARDIAC) PF 100 MG/5ML IV SOSY
PREFILLED_SYRINGE | INTRAVENOUS | Status: DC | PRN
  Administered 2022-07-07: 100 mg via INTRAVENOUS

## 2022-07-07 MED ORDER — ONDANSETRON HCL 4 MG/2ML IJ SOLN: 4.0000 mg | Freq: Once | INTRAMUSCULAR | Status: DC | PRN

## 2022-07-07 MED ORDER — SUGAMMADEX SODIUM 200 MG/2ML IV SOLN
INTRAVENOUS | Status: DC | PRN
  Administered 2022-07-07: 200 mg via INTRAVENOUS

## 2022-07-07 MED ORDER — BUPIVACAINE LIPOSOME 1.3 % IJ SUSP
INTRAMUSCULAR | Status: AC
  Filled 2022-07-07: qty 10

## 2022-07-07 MED ORDER — LIDOCAINE HCL (PF) 1 % IJ SOLN
INTRAMUSCULAR | Status: AC
  Filled 2022-07-07: qty 5

## 2022-07-07 MED ORDER — PROPOFOL 10 MG/ML IV BOLUS
INTRAVENOUS | Status: DC | PRN
Start: 2022-07-07 — End: 2022-07-07
  Administered 2022-07-07: 160 mg via INTRAVENOUS

## 2022-07-07 MED ORDER — TRAMADOL HCL 50 MG PO TABS: 50.0000 mg | ORAL_TABLET | Freq: Four times a day (QID) | ORAL | 0 refills | Status: DC | PRN

## 2022-07-07 MED ORDER — CEFAZOLIN SODIUM-DEXTROSE 2-4 GM/100ML-% IV SOLN
INTRAVENOUS | Status: AC
  Filled 2022-07-07: qty 100

## 2022-07-07 MED ORDER — ORAL CARE MOUTH RINSE: 15.0000 mL | Freq: Once | OROMUCOSAL | Status: AC

## 2022-07-07 MED ORDER — ONDANSETRON HCL 4 MG/2ML IJ SOLN
INTRAMUSCULAR | Status: AC
  Filled 2022-07-07: qty 2

## 2022-07-07 MED ORDER — FENTANYL CITRATE (PF) 100 MCG/2ML IJ SOLN
INTRAMUSCULAR | Status: DC | PRN
  Administered 2022-07-07 (×2): 50 ug via INTRAVENOUS

## 2022-07-07 MED ORDER — DEXMEDETOMIDINE HCL IN NACL 200 MCG/50ML IV SOLN
INTRAVENOUS | Status: DC | PRN
  Administered 2022-07-07: 16 ug via INTRAVENOUS

## 2022-07-07 MED ORDER — BUPIVACAINE-EPINEPHRINE (PF) 0.25% -1:200000 IJ SOLN
INTRAMUSCULAR | Status: AC
  Filled 2022-07-07: qty 30

## 2022-07-07 MED ORDER — FENTANYL CITRATE (PF) 100 MCG/2ML IJ SOLN: 25.0000 ug | INTRAMUSCULAR | Status: DC | PRN

## 2022-07-07 MED ORDER — BUPIVACAINE HCL (PF) 0.5 % IJ SOLN
INTRAMUSCULAR | Status: AC
  Filled 2022-07-07: qty 20

## 2022-07-07 MED ORDER — CEFAZOLIN SODIUM-DEXTROSE 2-4 GM/100ML-% IV SOLN
2.0000 g | INTRAVENOUS | Status: AC
  Administered 2022-07-07: 2 g via INTRAVENOUS

## 2022-07-07 MED ORDER — MIDAZOLAM HCL 2 MG/2ML IJ SOLN: 2.0000 mg | Freq: Once | INTRAMUSCULAR | Status: AC

## 2022-07-07 MED ORDER — LACTATED RINGERS IV SOLN: INTRAVENOUS | Status: DC

## 2022-07-07 MED ORDER — SUCCINYLCHOLINE CHLORIDE 200 MG/10ML IV SOSY
PREFILLED_SYRINGE | INTRAVENOUS | Status: AC
  Filled 2022-07-07: qty 10

## 2022-07-07 MED ORDER — BUPIVACAINE HCL (PF) 0.5 % IJ SOLN
INTRAMUSCULAR | Status: DC | PRN
  Administered 2022-07-07: 20 mL via PERINEURAL

## 2022-07-07 MED ORDER — ONDANSETRON HCL 4 MG/2ML IJ SOLN
INTRAMUSCULAR | Status: DC | PRN
  Administered 2022-07-07: 4 mg via INTRAVENOUS

## 2022-07-07 MED ORDER — MIDAZOLAM HCL 2 MG/2ML IJ SOLN
INTRAMUSCULAR | Status: AC
  Filled 2022-07-07: qty 2

## 2022-07-07 MED ORDER — LACTATED RINGERS IR SOLN
Status: DC | PRN
  Administered 2022-07-07: 3000 mL

## 2022-07-07 MED ORDER — ROCURONIUM BROMIDE 10 MG/ML (PF) SYRINGE
PREFILLED_SYRINGE | INTRAVENOUS | Status: AC
  Filled 2022-07-07: qty 10

## 2022-07-07 MED ORDER — BUPIVACAINE LIPOSOME 1.3 % IJ SUSP
INTRAMUSCULAR | Status: DC | PRN
  Administered 2022-07-07: 10 mL via PERINEURAL

## 2022-07-07 MED ORDER — SUCCINYLCHOLINE CHLORIDE 200 MG/10ML IV SOSY
PREFILLED_SYRINGE | INTRAVENOUS | Status: DC | PRN
  Administered 2022-07-07: 100 mg via INTRAVENOUS

## 2022-07-07 MED ORDER — 0.9 % SODIUM CHLORIDE (POUR BTL) OPTIME
TOPICAL | Status: DC | PRN
  Administered 2022-07-07: 30 mL

## 2022-07-07 SURGICAL SUPPLY — 60 items
ADAPTER IRRIG TUBE 2 SPIKE SOL (ADAPTER) ×4 IMPLANT
ADPR TBG 2 SPK PMP STRL ASCP (ADAPTER) ×2
ANCH SUT 2 TPE SLF PNCH BLK (Anchor) ×1 IMPLANT
ANCH SUT 2 TPE SLF PNCH BLU (Anchor) ×1 IMPLANT
ANCH SUT STRL LF DISP ARGO (Anchor) ×2 IMPLANT
ANCHOR ARGO 4.75 1 RIBBON (Anchor) ×2 IMPLANT
ANCHOR YKNOT PRO RC BLUE TAPE (Anchor) ×1 IMPLANT
ANCHOR YKNOT PRO RC HI-FI TAPE (Anchor) ×1 IMPLANT
BLADE FULL RADIUS 3.5 (BLADE) ×2 IMPLANT
BLADE INCISOR PLUS 4.5 (BLADE) ×2 IMPLANT
BRUSH SCRUB EZ  4% CHG (MISCELLANEOUS) ×1
BRUSH SCRUB EZ 4% CHG (MISCELLANEOUS) ×1 IMPLANT
BUR BR 5.5 WIDE MOUTH (BURR) IMPLANT
CANNULA 5.75X7 CRYSTAL CLEAR (CANNULA) ×1 IMPLANT
CANNULA PARTIAL THREAD 2X7 (CANNULA) ×1 IMPLANT
CANNULA SHOULDER 7CM (CANNULA) ×2 IMPLANT
CANNULA TWIST IN 8.25X7CM (CANNULA) ×1 IMPLANT
CANNULA TWIST IN 8.25X9CM (CANNULA) ×1 IMPLANT
COOLER POLAR GLACIER W/PUMP (MISCELLANEOUS) ×2 IMPLANT
DRAPE 3/4 80X56 (DRAPES) ×2 IMPLANT
DRAPE INCISE IOBAN 66X45 STRL (DRAPES) ×2 IMPLANT
DRAPE STERI 35X30 U-POUCH (DRAPES) ×2 IMPLANT
DRAPE U-SHAPE 47X51 STRL (DRAPES) ×2 IMPLANT
GAUZE SPONGE 4X4 12PLY STRL (GAUZE/BANDAGES/DRESSINGS) ×2 IMPLANT
GAUZE XEROFORM 1X8 LF (GAUZE/BANDAGES/DRESSINGS) ×2 IMPLANT
GLOVE BIOGEL PI IND STRL 8 (GLOVE) ×1 IMPLANT
GLOVE BIOGEL PI INDICATOR 8 (GLOVE) ×1
GLOVE SURG ORTHO 8.5 STRL (GLOVE) ×2 IMPLANT
GOWN STRL REUS W/ TWL LRG LVL3 (GOWN DISPOSABLE) ×2 IMPLANT
GOWN STRL REUS W/TWL LRG LVL3 (GOWN DISPOSABLE) ×4
IV LACTATED RINGER IRRG 3000ML (IV SOLUTION) ×8
IV LR IRRIG 3000ML ARTHROMATIC (IV SOLUTION) ×6 IMPLANT
KIT STABILIZATION SHOULDER (MISCELLANEOUS) ×2 IMPLANT
KIT TURNOVER KIT A (KITS) ×2 IMPLANT
MANIFOLD NEPTUNE II (INSTRUMENTS) ×4 IMPLANT
MASK FACE SPIDER DISP (MASK) ×2 IMPLANT
MAT ABSORB  FLUID 56X50 GRAY (MISCELLANEOUS) ×2
MAT ABSORB FLUID 56X50 GRAY (MISCELLANEOUS) ×2 IMPLANT
NDL SCORPION MULTI FIRE (NEEDLE) IMPLANT
NDL SPNL 18GX3.5 QUINCKE PK (NEEDLE) ×1 IMPLANT
NEEDLE HYPO 22GX1.5 SAFETY (NEEDLE) ×2 IMPLANT
NEEDLE SCORPION MULTI FIRE (NEEDLE) ×2 IMPLANT
NEEDLE SPNL 18GX3.5 QUINCKE PK (NEEDLE) ×2 IMPLANT
PACK ARTHROSCOPY SHOULDER (MISCELLANEOUS) ×2 IMPLANT
PAD ABD DERMACEA PRESS 5X9 (GAUZE/BANDAGES/DRESSINGS) ×3 IMPLANT
PAD ARMBOARD 7.5X6 YLW CONV (MISCELLANEOUS) ×4 IMPLANT
PAD WRAPON POLAR SHDR XLG (MISCELLANEOUS) ×1 IMPLANT
SLING ULTRA II LG (MISCELLANEOUS) ×2 IMPLANT
SPONGE T-LAP 18X18 ~~LOC~~+RFID (SPONGE) ×2 IMPLANT
STRAP SAFETY 5IN WIDE (MISCELLANEOUS) ×2 IMPLANT
SUT ETHILON 4 0 PS 2 18 (SUTURE) ×2 IMPLANT
SUT ETHILON NAB PS2 4-0 18IN (SUTURE) ×2 IMPLANT
SYR 10ML LL (SYRINGE) ×2 IMPLANT
SYR 50ML LL SCALE MARK (SYRINGE) ×2 IMPLANT
TAPE MICROFOAM 4IN (TAPE) ×2 IMPLANT
TUBING CONNECTING 10 (TUBING) ×2 IMPLANT
TUBING INFLOW SET DBFLO PUMP (TUBING) ×2 IMPLANT
WAND WEREWOLF FLOW 90D (MISCELLANEOUS) ×2 IMPLANT
WATER STERILE IRR 500ML POUR (IV SOLUTION) ×1 IMPLANT
WRAPON POLAR PAD SHDR XLG (MISCELLANEOUS) ×2

## 2022-07-07 NOTE — Discharge Instructions (Addendum)
PATIENT HAS PHYSICAL THERAPY AND FOLLOW-UP ALREADY SCHEDULED  Wear sling at all times, including sleep.  You will need to use the sling for a total of 4 weeks following surgery.  Do not try and lift your arm up or away from your body for any reason.   Keep the dressing dry.  You may remove bandage in 3 days.  You may place Band-Aids over top of the incisions.  May shower once dressing is removed in 3 days.  Remove sling carefully only for showers, leaving arm down by your side while in the shower.  +++ Make sure to take some pain medication this evening before you fall asleep, in preparation for the nerve block wearing off in the middle of the night.  If the the pain medication causes itching, or is too strong, try taking a single tablet at a time, or combining with Benadryl.  You may be most comfortable sleeping in a recliner.  If you do sleep in near bed, placed pillows behind the shoulder that have the operation to support it.  AMBULATORY SURGERY  DISCHARGE INSTRUCTIONS   The drugs that you were given will stay in your system until tomorrow so for the next 24 hours you should not:  Drive an automobile Make any legal decisions Drink any alcoholic beverage   You may resume regular meals tomorrow.  Today it is better to start with liquids and gradually work up to solid foods.  You may eat anything you prefer, but it is better to start with liquids, then soup and crackers, and gradually work up to solid foods.   Please notify your doctor immediately if you have any unusual bleeding, trouble breathing, redness and pain at the surgery site, drainage, fever, or pain not relieved by medication.    Additional Instructions:     Please contact your physician with any problems or Same Day Surgery at [phone], Monday through Friday 6 am to 4 pm, or [place] [facility] number at [phone].   Interscalene Nerve Block (ISNB) Discharge Instructions    For your surgery  you have received an Interscalene Nerve Block. Nerve Blocks affect many types of nerves, including nerves that control movement, pain and normal sensation.  You may experience feelings such as numbness, tingling, heaviness, weakness or the inability to move your arm or the feeling or sensation that your arm has "fallen asleep". A nerve block can last for 2 - 36 hours or more depending on the medication used.  Usually the weakness wears off first.  The tingling and heaviness usually wear off next.  Finally you may start to notice pain.  Keep in mind that this may occur in any order.  once a nerve block starts to wear off it is usually completely gone within 60 minutes. ISNB may cause mild shortness of breath, a hoarse voice, blurry vision, unequal pupils, or drooping of the face on the same side as the nerve block.  These symptoms will usually go away within 12 hours.  Very rarely the procedure itself can cause mild seizures. If needed, your surgeon will give you a prescription for pain medication.  It will take about 60 minutes for the oral pain medication to become fully effective.  So, it is recommended that you start taking this medication before the nerve block first begins to wear off, or when you first begin to feel discomfort. Keep in mind that nerve blocks often wear off in the middle of the night.  If you are going to bed and the block has not started to wear off or you have not started to have any discomfort, consider setting an alarm for 2 to 3 hours, so you can assess your block.  If you notice the block is wearing off or you are starting to have discomfort, you can take your pain medication. Take your pain medication only as prescribed.  Pain medication can cause sedation and decrease your breathing if you take more than you need for the level of pain that you have. Nausea is a common side effect of many pain medications.  You may want to eat something before taking your pain medicine to prevent  nausea. After an Interscalene nerve block, you cannot feel pain, pressure or extremes in temperature in the effected arm.  Because your arm is numb it is at an increased risk for injury.  To decrease the possibility of injury, please practice the following:  While you are awake change the position of your arm frequently to prevent too much pressure on any one area for prolonged periods of time.  If you have a cast or tight dressing, check the color or your fingers every couple of hours.  Call your surgeon with the appearance of any discoloration (white or blue). If you are given a sling to wear before you go home, please wear it  at all times until the block has completely worn off.  Do not get up at night without your sling. If you experience any problems or concerns, please contact your surgeon's office. If you experience severe or prolonged shortness of breath go to the nearest emergency department.  SHOULDER SLING IMMOBILIZER   VIDEO Slingshot 2 Shoulder Brace Application - YouTube ---[email or web address]  INSTRUCTIONS While supporting the injured arm, slide the forearm into the sling. Wrap the adjustable shoulder strap around the neck and shoulders and attach the strap end to the sling using  the "alligator strap tab."  Adjust the shoulder strap to the required length. Position the shoulder pad behind the neck. To secure the shoulder pad location (optional), pull the shoulder strap away from the shoulder pad, unfold the hook material on the top of the pad, then press the shoulder strap back onto the hook material to secure the pad in place. Attach the closure strap across the open top of the sling. Position the strap so that it holds the arm securely in the sling. Next, attach the thumb strap to the open end of the sling between the thumb and fingers. After sling has been fit, it may be easily removed and reapplied using the quick release buckle on shoulder strap. If a  neutral pillow or 15 abduction pillow is included, place the pillow at the waistline. Attach the sling to the pillow, lining up hook material on the pillow with the loop on sling. Adjust the waist strap to fit.  If waist strap is too long, cut it to fit. Use the small piece of double sided hook material (located on top of the pillow) to secure the strap end. Place the double sided hook material on the inside of the cut strap end and secure it to the waist strap.     If no pillow is included, attach the waist strap to the sling and adjust to fit.    Washing Instructions: Straps and sling must be removed and cleaned regularly depending on your activity level and perspiration. Hand wash straps and sling in cold water with mild detergent, rinse,  air dry

## 2022-07-07 NOTE — Op Note (Signed)
07/07/2022  9:30 AM  PATIENT:  [name] [name]  71 y.o. 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    The risks benefits and alternatives were discussed with the patient preoperatively including but not limited to the risks of infection, bleeding, nerve injury, persistent pain or weakness, failure of the hardware, re-tear of the rotator cuff and the need for further surgery. Medical risks include DVT and pulmonary embolism, myocardial infarction, stroke, pneumonia, respiratory failure and death. Patient understood these risks and wished to proceed.  OPERATIVE IMPLANTS: Conmed Suture Bridge with 2 medial Y-Knot anchors and 2 lateral crossFT anchors  OPERATIVE PROCEDURE: The patient was met in the preoperative area. The right shoulder was signed with my initials according the hospital's correct site of surgery protocol. The patient is brought to the OR and underwent a supraclavicular block and general endotracheal intubation by the anesthesia service.  The patient was placed in a beachchair position.  A spider arm positioner was used for this case. Examination under anesthesia revealed full passive ROM and a negative sulcus sign. There was anterior/posterior instability.  The patient was prepped and draped in a sterile fashion. A timeout was performed to  verify the patient's name, date of birth, medical record number, correct site of surgery and correct procedure to be performed there was also used to verify the patient received antibiotics that all appropriate instruments, implants and radiographs studies were available in the room. Once all in attendance were in agreement case began.  Bony landmarks were drawn out with a surgical marker along with proposed arthroscopy incisions. These were pre-injected with 0.25% marcaine with epi. An 11 blade was used to establish a posterior portal through which the arthroscope was placed in the glenohumeral joint. A full diagnostic examination of the shoulder was performed.  The anterior portal was established under direct visualization with an 18-gauge spinal needle.  A 5.75 mm arthroscopic cannula was placed through the anterior portal.   The intra-articular portion of the biceps tendon was found to have a partial tear involving greater than 50% of the diameter. Therefore the decision was made to perform a tenotomy. An arthroscopic wand was used to release the biceps tendon off the superior labrum. The arthroscopic shaver was then used to debride the frayed edges of the labrum. There were no anterior or superior labral tears seen.  Subscapularis tendon was intact. Patient had a full-thickness tear involving the supraspinatus with retraction. There were no loose bodies within the inferior recess and no evidence of HAGL lesion.  The arthroscope was then placed in the subacromial space. A lateral portal was then established using an 18-gauge spinal needle for localization.   The greater tuberosity was debrided using a 5.5 mm resector shaver blade to remove all remaining foreign fibers of the rotator cuff.  Debridement was performed until punctate bleeding was seen at the greater tuberosity footprint, which will allow  for rotator cuff healing.  Extensive bursitis was encountered and debrided using a 4-0 resector shaver  blade and a 90 ArthroCare wand from the lateral portal. Using the a double row suture bridge system medial anchors with fiber tape were placed. The cuff was mobilized and the tape passed through the rotator cuff. The tape was then crossed in usual fashion and fixated on the lateral side with two CrossFT anchors. The final construct was stable and moved as a unit with excellent coverage of the humeral head.  A subacromial decompression was performed using a 4.0 mm burr from the lateral portal. The 4.0 mm burr was then placed through the anterior portal.  A distal clavicle excision was performed, with 8 mm of distal clavicle resected. Subacromial space was then copiously irrigated to remove all osseous debris. Final arthroscopic images were taken. Arthroscopic images were then removed.  All incisions were copiously irrigated. Skin closure for the arthroscopic incisions was performed with 3-0 nylon.  A dry sterile dressing including Steri-Strips was applied .  The patient was placed in an abduction sling.  All sharp and instrument counts were correct at the conclusion of the case. I was scrubbed and present for the entire case. I spoke with the patient's family in the post-op consultation room and informed them that the case had been performed without complication and the patient was stable in recovery room.   [name] Bushman, MD

## 2022-07-07 NOTE — Anesthesia Procedure Notes (Signed)
Anesthesia Regional Block: Interscalene brachial plexus block   Pre-Anesthetic Checklist: , timeout performed,  Correct Patient, Correct Site, Correct Laterality,  Correct Procedure, Correct Position, site marked,  Risks and benefits discussed,  Surgical consent,  Pre-op evaluation,  At surgeon's request and post-op pain management  Laterality: Upper and Right  Prep: chloraprep       Needles:  Injection technique: Single-shot  Needle Type: Echogenic Stimulator Needle     Needle Length: 10cm  Needle Gauge: 20     Additional Needles:   Procedures:,,,, ultrasound used (permanent image in chart),,    Narrative:  Start time: 07/07/2022 7:35 AM End time: 07/07/2022 7:37 AM  Performed by: Personally  Anesthesiologist: Molli Barrows, MD  Additional Notes: Pt. Identified and accepting of procedure after risks and benefits fully reviewed and questions answered. Time out performed and laterality confirmed prior to procedure.  ISNB  performed without difficulty and well tolerated.  Neg IV and SATD.  No pain on injection of Local anesthetic and VSST.

## 2022-07-07 NOTE — Transfer of Care (Signed)
Immediate Anesthesia Transfer of Care Note  Patient: [name] [name]  Procedure(s) Performed: SUBACROMIAL DECOMPRESSION, ROTATOR CUFF REPAIR AND BICEP TENDON REPAIR     distal clavicle incision, debridement (Right: Shoulder)  Patient Location: PACU  Anesthesia Type:General  Level of Consciousness: awake and alert   Airway & Oxygen Therapy: Patient Spontanous Breathing and Patient connected to nasal cannula oxygen  Post-op Assessment: Report given to RN and Post -op Vital signs reviewed and stable  Post vital signs: Reviewed and stable  Last Vitals:  Vitals Value Taken Time  BP    Temp    Pulse 60 07/07/22 0939  Resp 15 07/07/22 0939  SpO2 93 % 07/07/22 0939  Vitals shown include unvalidated device data.  Last Pain:  Vitals:   07/07/22 0623  TempSrc: Oral  PainSc: 3          Complications: No notable events documented.

## 2022-07-07 NOTE — Anesthesia Procedure Notes (Signed)
Procedure Name: Intubation Date/Time: 07/07/2022 7:51 AM  Performed by: Fredderick Phenix, CRNAPre-anesthesia Checklist: Patient identified, Emergency Drugs available, Suction available and Patient being monitored Patient Re-evaluated:Patient Re-evaluated prior to induction Oxygen Delivery Method: Circle system utilized Preoxygenation: Pre-oxygenation with 100% oxygen Induction Type: IV induction Ventilation: Mask ventilation without difficulty Laryngoscope Size: McGraph and 4 Grade View: Grade I Tube type: Oral Tube size: 7.5 mm Number of attempts: 1 Airway Equipment and Method: Stylet and Oral airway Placement Confirmation: ETT inserted through vocal cords under direct vision, positive ETCO2 and breath sounds checked- equal and bilateral Secured at: 21 cm Tube secured with: Tape Dental Injury: Teeth and Oropharynx as per pre-operative assessment

## 2022-07-07 NOTE — H&P (Signed)
The patient has been re-examined, and the chart reviewed, and there have been no interval changes to the documented history and physical.  Plan a right shoulder scope today.  Anesthesia is consulted regarding a peripheral nerve block for post-operative pain.  The risks, benefits, and alternatives have been discussed at length, and the patient is willing to proceed.    

## 2022-07-07 NOTE — Anesthesia Preprocedure Evaluation (Signed)
Anesthesia Evaluation  Patient identified by MRN, date of birth, ID band Patient awake    Reviewed: Allergy & Precautions, H&P , NPO status , Patient's Chart, lab work & pertinent test results, reviewed documented beta blocker date and time   Airway Mallampati: III  TM Distance: >3 FB Neck ROM: full    Dental  (+) Teeth Intact   Pulmonary neg pulmonary ROS,    Pulmonary exam normal        Cardiovascular Exercise Tolerance: Good hypertension, On Medications (-) angina+ CAD and + Cardiac Stents  Normal cardiovascular exam Rhythm:regular Rate:Normal     Neuro/Psych negative neurological ROS  negative psych ROS   GI/Hepatic Neg liver ROS, GERD  Medicated,  Endo/Other  Hypothyroidism   Renal/GU negative Renal ROS  negative genitourinary   Musculoskeletal   Abdominal   Peds  Hematology negative hematology ROS (+)   Anesthesia Other Findings Past Medical History: No date: Acquired hypothyroidism No date: Aortic atherosclerosis (HCC) No date: Coronary artery disease     Comment:  a.) LHC 06/26/2019: EF 55-60%; LVEDP 25-30%; 80 pLAD,               40% mLAD, 40% p-mLCx, 30% mRCA; staged PCI planned. b.)               LHC/staged PCI 07/05/2019: 55% mLAD, 40% p-mLCx, 30%               mRCA, 85% p-mLAD, 50% D2 --> orbital atherectomy and PCI               performed placing overlapping 3.0 x 8 mm (pLAD) and 3.0 x              30 mm (mLAD) Resolute Onyx DES x 2 No date: Diverticulosis No date: ED (erectile dysfunction)     Comment:  a.) on PDE5i (tadalifil) No date: Elevated LFTs No date: GERD (gastroesophageal reflux disease) No date: HLD (hyperlipidemia) No date: Hypertension No date: Long term current use of antithrombotics/antiplatelets     Comment:  a.) daily DAPT therapy (ASA + clopidogrel) No date: Melanoma ([place]) 2004: PAF (paroxysmal atrial fibrillation) (HCC)     Comment:  a.) CHA2DS2-VASc = 3 (age, HTN,  aortic plaque). b.)               rate/rhythm maintained on oral diltiazem; on daily DAPT               (ASA + clopidogrel) No date: Paroxysmal SVT (supraventricular tachycardia) (HCC) No date: Pure hypercholesterolemia Past Surgical History: 07/05/2019: CORONARY ATHERECTOMY; N/A     Comment:  Procedure: CORONARY ATHERECTOMY;  Surgeon: Wellington Hampshire, MD;  Location: [place] [facility];  Service:               Cardiovascular;  Laterality: N/A; 07/05/2019: CORONARY BALLOON ANGIOPLASTY; N/A     Comment:  Procedure: CORONARY BALLOON ANGIOPLASTY;  Surgeon:               Wellington Hampshire, MD;  Location: [place] [facility];                Service: Cardiovascular;  Laterality: N/A; 07/05/2019: CORONARY STENT INTERVENTION; N/A     Comment:  Procedure: CORONARY STENT INTERVENTION;  Surgeon: Wellington Hampshire, MD;  Location: [place] [facility];  Service:  Cardiovascular;  Laterality: N/A; 2014: EYE SURGERY; Right     Comment:  Retina detachment 06/26/2019: LEFT HEART CATH AND CORONARY ANGIOGRAPHY; Left     Comment:  Procedure: LEFT HEART CATH AND CORONARY ANGIOGRAPHY;                Surgeon: Nelva Bush, MD;  Location: [place]               [facility];  Service: Cardiovascular;  Laterality: Left; 02/17/1983: MELANOMA EXCISION 2004: NECK SURGERY 04/18/2020: XI ROBOTIC ASSISTED INGUINAL HERNIA REPAIR WITH MESH;  Bilateral     Comment:  Procedure: XI ROBOTIC ASSISTED INGUINAL HERNIA REPAIR               WITH MESH;  Surgeon: Jules Husbands, MD;  Location: [facility]               ORS;  Service: General;  Laterality: Bilateral; BMI    Body Mass Index: 27.67 kg/m     Reproductive/Obstetrics negative OB ROS                             Anesthesia Physical Anesthesia Plan  ASA: 3  Anesthesia Plan: General ETT   Post-op Pain Management: Regional block*   Induction:   PONV Risk Score and Plan:   Airway Management  Planned:   Additional Equipment:   Intra-op Plan:   Post-operative Plan:   Informed Consent: I have reviewed the patients History and Physical, chart, labs and discussed the procedure including the risks, benefits and alternatives for the proposed anesthesia with the patient or authorized representative who has indicated his/her understanding and acceptance.     Dental Advisory Given  Plan Discussed with: CRNA  Anesthesia Plan Comments:         Anesthesia Quick Evaluation

## 2022-07-08 ENCOUNTER — Encounter: Payer: Self-pay | Admitting: Orthopedic Surgery

## 2022-07-10 NOTE — Anesthesia Postprocedure Evaluation (Signed)
Anesthesia Post Note  Patient: [name] [name]  Procedure(s) Performed: SUBACROMIAL DECOMPRESSION, ROTATOR CUFF REPAIR AND DISTAL CLAVICLE INCISION AND DEBRIDEMENT     distal clavicle incision, debridement (Right: Shoulder)  Patient location during evaluation: PACU Anesthesia Type: General Level of consciousness: awake and alert Pain management: pain level controlled Vital Signs Assessment: post-procedure vital signs reviewed and stable Respiratory status: spontaneous breathing, nonlabored ventilation, respiratory function stable and patient connected to nasal cannula oxygen Cardiovascular status: blood pressure returned to baseline and stable Postop Assessment: no apparent nausea or vomiting Anesthetic complications: no   No notable events documented.   Last Vitals:  Vitals:   07/07/22 1000 07/07/22 1018  BP: 111/64   Pulse: 72 (P) 64  Resp: 16 (P) 16  Temp:  (!) (P) 36.2 C  SpO2: 94% (P) 95%    Last Pain:  Vitals:   07/09/22 0842  TempSrc:   PainSc: 0-No pain                 Molli Barrows

## 2022-07-16 DIAGNOSIS — M25511 Pain in right shoulder: Secondary | ICD-10-CM | POA: Diagnosis not present

## 2022-07-16 DIAGNOSIS — M25611 Stiffness of right shoulder, not elsewhere classified: Secondary | ICD-10-CM | POA: Diagnosis not present

## 2022-07-20 ENCOUNTER — Ambulatory Visit (INDEPENDENT_AMBULATORY_CARE_PROVIDER_SITE_OTHER): Payer: Medicare Other | Admitting: Family Medicine

## 2022-07-20 ENCOUNTER — Ambulatory Visit: Payer: Medicare Other

## 2022-07-20 ENCOUNTER — Ambulatory Visit: Payer: Self-pay

## 2022-07-20 ENCOUNTER — Encounter: Payer: Self-pay | Admitting: Family Medicine

## 2022-07-20 VITALS — BP 138/82 | HR 91 | Temp 98.7°F | Resp 16 | Wt 200.0 lb

## 2022-07-20 DIAGNOSIS — B029 Zoster without complications: Secondary | ICD-10-CM | POA: Diagnosis not present

## 2022-07-20 MED ORDER — PREDNISONE 10 MG (21) PO TBPK: ORAL_TABLET | ORAL | 0 refills | Status: DC

## 2022-07-20 MED ORDER — VALACYCLOVIR HCL 1 G PO TABS: 1000.0000 mg | ORAL_TABLET | Freq: Two times a day (BID) | ORAL | 0 refills | Status: AC

## 2022-07-20 NOTE — Progress Notes (Signed)
    SUBJECTIVE:   CHIEF COMPLAINT / HPI:   RASH Duration:  2-3 days  Location: R sided face extending down R side of neck. No eye involvement. Itching: yes Redness: yes Oozing: no Scaling: no Blisters: yes Painful: yes Fevers: no Change in detergents/soaps/personal care products: no History of same: yes Treatments attempted: none   OBJECTIVE:   BP 138/82 (BP Location: Left Arm, Patient Position: Sitting, Cuff Size: Large)   Pulse 91   Temp 98.7 F (37.1 C) (Oral)   Resp 16   Wt 200 lb (90.7 kg)   BMI 27.12 kg/m   Gen: well appearing, in NAD Skin: erythematous vesicular rash to R side face along V2, V3 of trigeminal nerve distribution    ASSESSMENT/PLAN:   Shingles Uncomplicated, in trigeminal nerve distribution on R side of face. No ocular or nasal involvement. Rx valtrex, steroid taper for pain. Recommend shingles vaccine once healed. F/u prn.     Myles Gip, DO

## 2022-07-20 NOTE — Telephone Encounter (Signed)
FYI-Review? Scheduled for this afternoon

## 2022-07-20 NOTE — Telephone Encounter (Signed)
  Chief Complaint: Rash on side of face - currently itchy Symptoms: Rash Frequency: 3 days Pertinent Negatives: Patient denies painful Disposition: '[]'$ ED /'[]'$ Urgent Care (no appt availability in office) / '[x]'$ Appointment(In office/virtual)/ '[]'$  [facility]/ '[]'$ Home Care/ '[]'$ Refused Recommended Disposition /'[]'$ [facility]/ '[]'$  Follow-up with PCP Additional Notes: PT has had shingles in the past and believes that this is shingles again. Pt is unable to come into office until this afternoon as he is recovering from sx and cannot drive.    Summary: rash on face   Pt states he has a rash on the rt side of his face and neck for x3d   Please assist pt further      Reason for Disposition  [1] Shingles rash (matches SYMPTOMS) AND [2] weak immune system (e.g., HIV positive, cancer chemotherapy, chronic steroid treatment, splenectomy) AND [3] NOT taking antiviral medication  Answer Assessment - Initial Assessment Questions 1. APPEARANCE of RASH: "Describe the rash."      Blisters on side of face 2. LOCATION: "Where is the rash located?"      *No Answer* 3. ONSET: "When did the rash start?"      *No Answer* 4. ITCHING: "Does the rash itch?" If Yes, ask: "How bad is the itch?"  (Scale 1-10; or mild, moderate, severe)     *No Answer* 5. PAIN: "Does the rash hurt?" If Yes, ask: "How bad is the pain?"  (Scale 0-10; or none, mild, moderate, severe)    - NONE (0): no pain    - MILD (1-3): doesn't interfere with normal activities     - MODERATE (4-7): interferes with normal activities or awakens from sleep     - SEVERE (8-10): excruciating pain, unable to do any normal activities     *No Answer* 6. OTHER SYMPTOMS: "Do you have any other symptoms?" (e.g., fever)     *No Answer* 7. PREGNANCY: "Is there any chance you are pregnant?" "When was your last menstrual period?"     na  Protocols used: Shingles (Z[place])-A-AH

## 2022-07-30 NOTE — H&P (Signed)
NAME: [name] [name] MRN:   [id number] DOB:   21-Mar-1951      HISTORY AND PHYSICAL   CHIEF COMPLAINT:  Right shoulder pain   HISTORY:   [name] Ruppertis a 71 y.o. male  with complaints of right shoulder pain. The symptoms began several weeks ago. Aggravating factors: no known event. Pain is located between the neck and shoulder. Discomfort is described as aching. Symptoms are exacerbated by repetitive movements. Evaluation to date: MRI: abnormal rotator cuff tear . Therapy to date includes: rest, ice, avoidance of offending activity, OTC analgesics which are somewhat effective, and home exercises which are somewhat effective.     Plan for Right SHOULDER ARTHROSCOPY WITH ROTATOR CUFF REPAIR    PAST MEDICAL HISTORY:       Past Medical History:  Diagnosis Date   Acquired hypothyroidism     Aortic atherosclerosis ([place])     Coronary artery disease      a.) LHC 06/26/2019: EF 55-60%; LVEDP 25-30%; 80 pLAD, 40% mLAD, 40% p-mLCx, 30% mRCA; staged PCI planned. b.) LHC/staged PCI 07/05/2019: 55% mLAD, 40% p-mLCx, 30% mRCA, 85% p-mLAD, 50% D2 --> orbital atherectomy and PCI performed placing overlapping 3.0 x 8 mm (pLAD) and 3.0 x 30 mm (mLAD) Resolute Onyx DES x 2   Diverticulosis     ED (erectile dysfunction)      a.) on PDE5i (tadalifil)   Elevated LFTs     GERD (gastroesophageal reflux disease)     HLD (hyperlipidemia)     Hypertension     Long term current use of antithrombotics/antiplatelets      a.) daily DAPT therapy (ASA + clopidogrel)   Melanoma (HCC)     PAF (paroxysmal atrial fibrillation) ([place]) 2004    a.) CHA2DS2-VASc = 3 (age, HTN, aortic plaque). b.) rate/rhythm maintained on oral diltiazem; on daily DAPT (ASA + clopidogrel)   Paroxysmal SVT (supraventricular tachycardia) (HCC)     Pure hypercholesterolemia        PAST SURGICAL HISTORY:        Past Surgical History:  Procedure Laterality Date   CORONARY ATHERECTOMY N/A 07/05/2019    Procedure: CORONARY ATHERECTOMY;  Surgeon:  Wellington Hampshire, MD;  Location: [place] [facility];  Service: Cardiovascular;  Laterality: N/A;   CORONARY BALLOON ANGIOPLASTY N/A 07/05/2019    Procedure: CORONARY BALLOON ANGIOPLASTY;  Surgeon: Wellington Hampshire, MD;  Location: [place] [facility];  Service: Cardiovascular;  Laterality: N/A;   CORONARY STENT INTERVENTION N/A 07/05/2019    Procedure: CORONARY STENT INTERVENTION;  Surgeon: Wellington Hampshire, MD;  Location: [place] [facility];  Service: Cardiovascular;  Laterality: N/A;   EYE SURGERY Right 2014    Retina detachment   LEFT HEART CATH AND CORONARY ANGIOGRAPHY Left 06/26/2019    Procedure: LEFT HEART CATH AND CORONARY ANGIOGRAPHY;  Surgeon: Nelva Bush, MD;  Location: [place] [facility];  Service: Cardiovascular;  Laterality: Left;   MELANOMA EXCISION   02/17/1983   NECK SURGERY   2004   XI ROBOTIC ASSISTED INGUINAL HERNIA REPAIR WITH MESH Bilateral 04/18/2020    Procedure: XI ROBOTIC ASSISTED INGUINAL HERNIA REPAIR WITH MESH;  Surgeon: Jules Husbands, MD;  Location: [facility];  Service: General;  Laterality: Bilateral;      MEDICATIONS:  (Not in a hospital admission)     ALLERGIES:       Allergies  Allergen Reactions   Hydrocodone-Acetaminophen Nausea And Vomiting and Other (See Comments)   Vicodin [Hydrocodone-Acetaminophen] Nausea And Vomiting  REVIEW OF SYSTEMS:   Negative except HPI   FAMILY HISTORY:        Family History  Problem Relation Age of Onset   Atrial fibrillation Mother     Stroke Father     Heart disease Father        SOCIAL HISTORY:   reports that he has never smoked. He has never used smokeless tobacco. He reports current alcohol use. He reports that he does not use drugs.   PHYSICAL EXAM:  General appearance: alert, cooperative, and no distress Neck: no JVD and supple, symmetrical, trachea midline Resp: clear to auscultation bilaterally Cardio: regular rate and rhythm, S1, S2 normal, no murmur, click, rub or gallop GI: soft,  non-tender; bowel sounds normal; no masses,  no organomegaly Extremities: extremities normal, atraumatic, no cyanosis or edema Pulses: 2+ and symmetric Skin: Skin color, texture, turgor normal. No rashes or lesions Neurologic: Alert and oriented X 3, normal strength and tone. Normal symmetric reflexes. Normal coordination and gait      LABORATORY STUDIES: Recent Labs (last 2 labs)   No results for input(s): "WBC", "HGB", "HCT", "PLT" in the last 72 hours.                Recent Labs (last 2 labs)   No results for input(s): "NA", "K", "CL", "CO2", "GLUCOSE", "BUN", "CREATININE", "CALCIUM" in the last 72 hours.     STUDIES/RESULTS:              Imaging Results  No results found.     ASSESSMENT: Right shoulder rotator cuff tear and impingement                              Active Problems:   * No active hospital problems. *     PLAN:  Right Shoulder arthroscopy and rotator cuff repair   Carlynn Spry 07/30/2022. 6:37 AM

## 2022-08-07 DIAGNOSIS — M25611 Stiffness of right shoulder, not elsewhere classified: Secondary | ICD-10-CM | POA: Diagnosis not present

## 2022-08-07 DIAGNOSIS — M25511 Pain in right shoulder: Secondary | ICD-10-CM | POA: Diagnosis not present

## 2022-08-12 DIAGNOSIS — M25611 Stiffness of right shoulder, not elsewhere classified: Secondary | ICD-10-CM | POA: Diagnosis not present

## 2022-08-12 DIAGNOSIS — M25511 Pain in right shoulder: Secondary | ICD-10-CM | POA: Diagnosis not present

## 2022-08-13 ENCOUNTER — Ambulatory Visit: Payer: Medicare Other | Admitting: Dermatology

## 2022-08-13 ENCOUNTER — Encounter: Payer: Self-pay | Admitting: Dermatology

## 2022-08-13 DIAGNOSIS — L578 Other skin changes due to chronic exposure to nonionizing radiation: Secondary | ICD-10-CM

## 2022-08-13 DIAGNOSIS — D492 Neoplasm of unspecified behavior of bone, soft tissue, and skin: Secondary | ICD-10-CM

## 2022-08-13 DIAGNOSIS — L570 Actinic keratosis: Secondary | ICD-10-CM

## 2022-08-13 DIAGNOSIS — C44619 Basal cell carcinoma of skin of left upper limb, including shoulder: Secondary | ICD-10-CM | POA: Diagnosis not present

## 2022-08-13 DIAGNOSIS — L720 Epidermal cyst: Secondary | ICD-10-CM

## 2022-08-13 DIAGNOSIS — S90414A Abrasion, right lesser toe(s), initial encounter: Secondary | ICD-10-CM

## 2022-08-13 DIAGNOSIS — L814 Other melanin hyperpigmentation: Secondary | ICD-10-CM

## 2022-08-13 DIAGNOSIS — L739 Follicular disorder, unspecified: Secondary | ICD-10-CM

## 2022-08-13 DIAGNOSIS — Z85820 Personal history of malignant melanoma of skin: Secondary | ICD-10-CM | POA: Diagnosis not present

## 2022-08-13 DIAGNOSIS — Z1283 Encounter for screening for malignant neoplasm of skin: Secondary | ICD-10-CM

## 2022-08-13 DIAGNOSIS — C4491 Basal cell carcinoma of skin, unspecified: Secondary | ICD-10-CM

## 2022-08-13 DIAGNOSIS — D229 Melanocytic nevi, unspecified: Secondary | ICD-10-CM

## 2022-08-13 DIAGNOSIS — L821 Other seborrheic keratosis: Secondary | ICD-10-CM

## 2022-08-13 DIAGNOSIS — L738 Other specified follicular disorders: Secondary | ICD-10-CM | POA: Diagnosis not present

## 2022-08-13 DIAGNOSIS — T148XXA Other injury of unspecified body region, initial encounter: Secondary | ICD-10-CM

## 2022-08-13 DIAGNOSIS — D1801 Hemangioma of skin and subcutaneous tissue: Secondary | ICD-10-CM

## 2022-08-13 HISTORY — DX: Basal cell carcinoma of skin, unspecified: C44.91

## 2022-08-13 NOTE — Progress Notes (Signed)
Follow-Up Visit   Subjective  [name] [name] is a 71 y.o. male who presents for the following: Annual Exam (Skin cancer screening. Full body. HxMM 1984 at left shoulder. Areas of concern today. Hx of AK's). The patient presents for Total-Body Skin Exam (TBSE) for skin cancer screening and mole check.  The patient has spots, moles and lesions to be evaluated, some may be new or changing and the patient has concerns that these could be cancer.   The following portions of the chart were reviewed this encounter and updated as appropriate:  Tobacco  Allergies  Meds  Problems  Med Hx  Surg Hx  Fam Hx      Review of Systems: No other skin or systemic complaints except as noted in HPI or Assessment and Plan.   Objective  Well appearing patient in no apparent distress; mood and affect are within normal limits.  A full examination was performed including scalp, head, eyes, ears, nose, lips, neck, chest, axillae, abdomen, back, buttocks, bilateral upper extremities, bilateral lower extremities, hands, feet, fingers, toes, fingernails, and toenails. All findings within normal limits unless otherwise noted below.  Neck - Anterior Scattered erythematous follicular based papules  Left Dorsal Forearm 0.7 cm pink papule     Right Dorsal Forearm x1, right anterior shoulder x1 (2) Erythematous thin papules/macules with gritty scale.   Right Hallux Metatarsophalangeal Joint abrasion   Assessment & Plan   History of Melanoma. Left shoulder, 1984. - No evidence of recurrence today - No lymphadenopathy - Recommend regular full body skin exams - Recommend daily broad spectrum sunscreen SPF 30+ to sun-exposed areas, reapply every 2 hours as needed.  - Call if any new or changing lesions are noted between office visits   Lentigines - Scattered tan macules - Due to sun exposure - Benign-appearing, observe - Recommend daily broad spectrum sunscreen SPF 30+ to sun-exposed areas, reapply  every 2 hours as needed. - Call for any changes  Seborrheic Keratoses - Stuck-on, waxy, tan-brown papules and/or plaques  - Benign-appearing - Discussed benign etiology and prognosis. - Observe - Call for any changes  Melanocytic Nevi - Tan-brown and/or pink-flesh-colored symmetric macules and papules - Benign appearing on exam today - Observation - Call clinic for new or changing moles - Recommend daily use of broad spectrum spf 30+ sunscreen to sun-exposed areas.   Hemangiomas - Red papules - Discussed benign nature - Observe - Call for any changes  Actinic Damage - Chronic condition, secondary to cumulative UV/sun exposure - diffuse scaly erythematous macules with underlying dyspigmentation - Recommend daily broad spectrum sunscreen SPF 30+ to sun-exposed areas, reapply every 2 hours as needed.  - Staying in the shade or wearing long sleeves, sun glasses (UVA+UVB protection) and wide brim hats (4-inch brim around the entire circumference of the hat) are also recommended for sun protection.  - Call for new or changing lesions.  Skin cancer screening performed today.  Sebaceous Hyperplasia. Face. - Small yellow papules with a central dell - Benign - Observe  [place]. Face.  - tiny firm white papules - type of cyst - benign - may be extracted if symptomatic - observe  Folliculitis Neck - Anterior  Benign, Observe. Patient deferred treatment at this time.     Neoplasm of skin Left Dorsal Forearm  Skin / nail biopsy Type of biopsy: tangential   Informed consent: discussed and consent obtained   Anesthesia: the lesion was anesthetized in a standard fashion   Anesthesia comment:  Area prepped  with alcohol Anesthetic:  1% lidocaine w/ epinephrine 1-100,000 buffered w/ 8.4% NaHCO3 Instrument used: flexible razor blade   Hemostasis achieved with: pressure, aluminum chloride and electrodesiccation   Outcome: patient tolerated procedure well   Post-procedure  details: wound care instructions given   Post-procedure details comment:  Ointment and small bandage applied  Specimen 1 - Surgical pathology Differential Diagnosis: R/O BCC  Check Margins: No  AK (actinic keratosis) (2) Right Dorsal Forearm x1, right anterior shoulder x1  Actinic keratoses are precancerous spots that appear secondary to cumulative UV radiation exposure/sun exposure over time. They are chronic with expected duration over 1 year. A portion of actinic keratoses will progress to squamous cell carcinoma of the skin. It is not possible to reliably predict which spots will progress to skin cancer and so treatment is recommended to prevent development of skin cancer.  Recommend daily broad spectrum sunscreen SPF 30+ to sun-exposed areas, reapply every 2 hours as needed.  Recommend staying in the shade or wearing long sleeves, sun glasses (UVA+UVB protection) and wide brim hats (4-inch brim around the entire circumference of the hat). Call for new or changing lesions.  Destruction of lesion - Right Dorsal Forearm x1, right anterior shoulder x1  Destruction method: cryotherapy   Informed consent: discussed and consent obtained   Lesion destroyed using liquid nitrogen: Yes   Outcome: patient tolerated procedure well with no complications   Post-procedure details: wound care instructions given   Additional details:  Prior to procedure, discussed risks of blister formation, small wound, skin dyspigmentation, or rare scar following cryotherapy. Recommend Vaseline ointment to treated areas while healing.   Abrasion Right Hallux Metatarsophalangeal Joint  Recommend using OTC antibiotic topical and keep area covered.  Watch for signs of infection   Return in about 1 year (around 08/14/2023) for TBSE, HxMM.  I, Emelia Salisbury, CMA, am acting as scribe for Forest Gleason, MD.  Documentation: I have reviewed the above documentation for accuracy and completeness, and I agree with the  above.  Forest Gleason, MD

## 2022-08-13 NOTE — Patient Instructions (Addendum)
Cryotherapy Aftercare  Wash gently with soap and water everyday.   Apply Vaseline daily until healed.     Wound Care Instructions  Cleanse wound gently with soap and water once a day then pat dry with clean gauze. Apply a thin coat of Petrolatum (petroleum jelly, "Vaseline") over the wound (unless you have an allergy to this). We recommend that you use a new, sterile tube of Vaseline. Do not pick or remove scabs. Do not remove the yellow or white "healing tissue" from the base of the wound.  Cover the wound with fresh, clean, nonstick gauze and secure with paper tape. You may use Band-Aids in place of gauze and tape if the wound is small enough, but would recommend trimming much of the tape off as there is often too much. Sometimes Band-Aids can irritate the skin.  You should call the office for your biopsy report after 1 week if you have not already been contacted.  If you experience any problems, such as abnormal amounts of bleeding, swelling, significant bruising, significant pain, or evidence of infection, please call the office immediately.  FOR ADULT SURGERY PATIENTS: If you need something for pain relief you may take 1 extra strength Tylenol (acetaminophen) AND 2 Ibuprofen (200mg each) together every 4 hours as needed for pain. (do not take these if you are allergic to them or if you have a reason you should not take them.) Typically, you may only need pain medication for 1 to 3 days.      Recommend taking Heliocare sun protection supplement daily in sunny weather for additional sun protection. For maximum protection on the sunniest days, you can take up to 2 capsules of regular Heliocare OR take 1 capsule of Heliocare Ultra. For prolonged exposure (such as a full day in the sun), you can repeat your dose of the supplement 4 hours after your first dose. Heliocare can be purchased at [facility], at some [facility] or at www.heliocare.com.     Recommend daily broad spectrum  sunscreen SPF 30+ to sun-exposed areas, reapply every 2 hours as needed. Call for new or changing lesions.  Staying in the shade or wearing long sleeves, sun glasses (UVA+UVB protection) and wide brim hats (4-inch brim around the entire circumference of the hat) are also recommended for sun protection.    Melanoma ABCDEs  Melanoma is the most dangerous type of skin cancer, and is the leading cause of death from skin disease.  You are more likely to develop melanoma if you: Have light-colored skin, light-colored eyes, or red or blond hair Spend a lot of time in the sun Tan regularly, either outdoors or in a tanning bed Have had blistering sunburns, especially during childhood Have a close family member who has had a melanoma Have atypical moles or large birthmarks  Early detection of melanoma is key since treatment is typically straightforward and cure rates are extremely high if we catch it early.   The first sign of melanoma is often a change in a mole or a new dark spot.  The ABCDE system is a way of remembering the signs of melanoma.  A for asymmetry:  The two halves do not match. B for border:  The edges of the growth are irregular. C for color:  A mixture of colors are present instead of an even brown color. D for diameter:  Melanomas are usually (but not always) greater than 6mm - the size of a pencil eraser. E for evolution:  The spot   keeps changing in size, shape, and color.  Please check your skin once per month between visits. You can use a small mirror in front and a large mirror behind you to keep an eye on the back side or your body.   If you see any new or changing lesions before your next follow-up, please call to schedule a visit.  Please continue daily skin protection including broad spectrum sunscreen SPF 30+ to sun-exposed areas, reapplying every 2 hours as needed when you're outdoors.   Staying in the shade or wearing long sleeves, sun glasses (UVA+UVB protection)  and wide brim hats (4-inch brim around the entire circumference of the hat) are also recommended for sun protection.    Due to recent changes in healthcare laws, you may see results of your pathology and/or laboratory studies on MyChart before the doctors have had a chance to review them. We understand that in some cases there may be results that are confusing or concerning to you. Please understand that not all results are received at the same time and often the doctors may need to interpret multiple results in order to provide you with the best plan of care or course of treatment. Therefore, we ask that you please give us 2 business days to thoroughly review all your results before contacting the office for clarification. Should we see a critical lab result, you will be contacted sooner.   If You Need Anything After Your Visit  If you have any questions or concerns for your doctor, please call our main line at [phone] and press option 4 to reach your doctor's medical assistant. If no one answers, please leave a voicemail as directed and we will return your call as soon as possible. Messages left after 4 pm will be answered the following business day.   You may also send us a message via MyChart. We typically respond to MyChart messages within 1-2 business days.  For prescription refills, please ask your pharmacy to contact our office. Our fax number is [phone].  If you have an urgent issue when the clinic is closed that cannot wait until the next business day, you can page your doctor at the number below.    Please note that while we do our best to be available for urgent issues outside of office hours, we are not available 24/7.   If you have an urgent issue and are unable to reach us, you may choose to seek medical care at your doctor's office, retail clinic, urgent care center, or emergency room.  If you have a medical emergency, please immediately call 911 or go to the emergency  department.  Pager Numbers  - Dr. Kowalski: [phone]  - Dr. Moye: [phone]  - Dr. Stewart: [phone]  In the event of inclement weather, please call our main line at [phone] for an update on the status of any delays or closures.  Dermatology Medication Tips: Please keep the boxes that topical medications come in in order to help keep track of the instructions about where and how to use these. Pharmacies typically print the medication instructions only on the boxes and not directly on the medication tubes.   If your medication is too expensive, please contact our office at [phone] option 4 or send us a message through MyChart.   We are unable to tell what your co-pay for medications will be in advance as this is different depending on your insurance coverage. However, we may be able to find a substitute   medication at lower cost or fill out paperwork to get insurance to cover a needed medication.   If a prior authorization is required to get your medication covered by your insurance company, please allow us 1-2 business days to complete this process.  Drug prices often vary depending on where the prescription is filled and some pharmacies may offer cheaper prices.  The website www.goodrx.com contains coupons for medications through different pharmacies. The prices here do not account for what the cost may be with help from insurance (it may be cheaper with your insurance), but the website can give you the price if you did not use any insurance.  - You can print the associated coupon and take it with your prescription to the pharmacy.  - You may also stop by our office during regular business hours and pick up a GoodRx coupon card.  - If you need your prescription sent electronically to a different pharmacy, notify our office through [place] MyChart or by phone at [phone] option 4.     Si Usted Necesita Algo Despus de Su Visita  Tambin puede enviarnos un  mensaje a travs de MyChart. Por lo general respondemos a los mensajes de MyChart en el transcurso de 1 a 2 das hbiles.  Para renovar recetas, por favor pida a su farmacia que se ponga en contacto con nuestra oficina. Nuestro nmero de fax es el [phone].  Si tiene un asunto urgente cuando la clnica est cerrada y que no puede esperar hasta el siguiente da hbil, puede llamar/localizar a su doctor(a) al nmero que aparece a continuacin.   Por favor, tenga en cuenta que aunque hacemos todo lo posible para estar disponibles para asuntos urgentes fuera del horario de oficina, no estamos disponibles las 24 horas del da, los 7 das de la semana.   Si tiene un problema urgente y no puede comunicarse con nosotros, puede optar por buscar atencin mdica  en el consultorio de su doctor(a), en una clnica privada, en un centro de atencin urgente o en una sala de emergencias.  Si tiene una emergencia mdica, por favor llame inmediatamente al 911 o vaya a la sala de emergencias.  Nmeros de bper  - Dr. Kowalski: [phone]  - Dra. Moye: [phone]  - Dra. Stewart: [phone]  En caso de inclemencias del tiempo, por favor llame a nuestra lnea principal al [phone] para una actualizacin sobre el estado de cualquier retraso o cierre.  Consejos para la medicacin en dermatologa: Por favor, guarde las cajas en las que vienen los medicamentos de uso tpico para ayudarle a seguir las instrucciones sobre dnde y cmo usarlos. Las farmacias generalmente imprimen las instrucciones del medicamento slo en las cajas y no directamente en los tubos del medicamento.   Si su medicamento es muy caro, por favor, pngase en contacto con nuestra oficina llamando al [phone] y presione la opcin 4 o envenos un mensaje a travs de MyChart.   No podemos decirle cul ser su copago por los medicamentos por adelantado ya que esto es diferente dependiendo de la cobertura de su seguro. Sin embargo,  es posible que podamos encontrar un medicamento sustituto a menor costo o llenar un formulario para que el seguro cubra el medicamento que se considera necesario.   Si se requiere una autorizacin previa para que su compaa de seguros cubra su medicamento, por favor permtanos de 1 a 2 das hbiles para completar este proceso.  Los precios de los medicamentos varan con frecuencia dependiendo del lugar de   lugar de dnde se surte la receta y alguna farmacias pueden ofrecer precios ms baratos.  El sitio web www.goodrx.com tiene cupones para medicamentos de diferentes farmacias. Los precios aqu no tienen en cuenta lo que podra costar con la ayuda del seguro (puede ser ms barato con su seguro), pero el sitio web puede darle el precio si no utiliz ningn seguro.  - Puede imprimir el cupn correspondiente y llevarlo con su receta a la farmacia.  - Tambin puede pasar por nuestra oficina durante el horario de atencin regular y recoger una tarjeta de cupones de GoodRx.  - Si necesita que su receta se enve electrnicamente a una farmacia diferente, informe a nuestra oficina a travs de MyChart de [place] o por telfono llamando al [phone] y presione la opcin 4.  

## 2022-08-18 ENCOUNTER — Telehealth: Payer: Self-pay

## 2022-08-18 NOTE — Telephone Encounter (Signed)
Discussed pathology results with patient. Scheduled for EDC. JP

## 2022-08-18 NOTE — Telephone Encounter (Signed)
-----   Message from Alfonso Patten, MD sent at 08/18/2022  1:32 PM EDT ----- Skin , left dorsal forearm BASAL CELL CARCINOMA, SUPERFICIAL AND NODULAR PATTERNS --> ED&C  MAs please call with results and schedule for [facility]. Thank you!

## 2022-08-19 DIAGNOSIS — M25611 Stiffness of right shoulder, not elsewhere classified: Secondary | ICD-10-CM | POA: Diagnosis not present

## 2022-08-19 DIAGNOSIS — M25511 Pain in right shoulder: Secondary | ICD-10-CM | POA: Diagnosis not present

## 2022-08-24 ENCOUNTER — Encounter: Payer: Self-pay | Admitting: Dermatology

## 2022-08-26 DIAGNOSIS — M25611 Stiffness of right shoulder, not elsewhere classified: Secondary | ICD-10-CM | POA: Diagnosis not present

## 2022-08-26 DIAGNOSIS — M25511 Pain in right shoulder: Secondary | ICD-10-CM | POA: Diagnosis not present

## 2022-09-02 DIAGNOSIS — M25511 Pain in right shoulder: Secondary | ICD-10-CM | POA: Diagnosis not present

## 2022-09-02 DIAGNOSIS — M25611 Stiffness of right shoulder, not elsewhere classified: Secondary | ICD-10-CM | POA: Diagnosis not present

## 2022-09-09 DIAGNOSIS — M25611 Stiffness of right shoulder, not elsewhere classified: Secondary | ICD-10-CM | POA: Diagnosis not present

## 2022-09-09 DIAGNOSIS — M25511 Pain in right shoulder: Secondary | ICD-10-CM | POA: Diagnosis not present

## 2022-09-17 ENCOUNTER — Ambulatory Visit: Payer: Medicare Other | Admitting: Dermatology

## 2022-09-17 ENCOUNTER — Encounter: Payer: Self-pay | Admitting: Dermatology

## 2022-09-17 DIAGNOSIS — C44619 Basal cell carcinoma of skin of left upper limb, including shoulder: Secondary | ICD-10-CM

## 2022-09-17 DIAGNOSIS — L814 Other melanin hyperpigmentation: Secondary | ICD-10-CM

## 2022-09-17 NOTE — Progress Notes (Signed)
   Follow-Up Visit   Subjective  [name] [name] is a 71 y.o. male who presents for the following: Skin Cancer (Here for EDC of  BCC at left dorsal forearm. Superficial and nodular. Bx 08/13/2022). He notes some dark spots as well.  The following portions of the chart were reviewed this encounter and updated as appropriate:  Tobacco  Allergies  Meds  Problems  Med Hx  Surg Hx  Fam Hx      Review of Systems: No other skin or systemic complaints except as noted in HPI or Assessment and Plan.   Objective  Well appearing patient in no apparent distress; mood and affect are within normal limits.  A focused examination was performed including left forearm, right hand. Relevant physical exam findings are noted in the Assessment and Plan.  Left Dorsal Forearm Pink healing biopsy site   Assessment & Plan  Basal cell carcinoma (BCC) of skin of left upper extremity including shoulder Left Dorsal Forearm  Destruction of lesion  Destruction method: electrodesiccation and curettage   Informed consent: discussed and consent obtained   Timeout:  patient name, date of birth, surgical site, and procedure verified Anesthesia: the lesion was anesthetized in a standard fashion   Anesthetic:  1% lidocaine w/ epinephrine 1-100,000 buffered w/ 8.4% NaHCO3 Curettage performed in three different directions: Yes   Electrodesiccation performed over the curetted area: Yes   Curettage cycles:  3 Final wound size (cm):  1.2 Hemostasis achieved with:  electrodesiccation Outcome: patient tolerated procedure well with no complications   Post-procedure details: sterile dressing applied and wound care instructions given   Dressing type: petrolatum     Lentigines - Scattered tan macules - Due to sun exposure - Benign-appearing, observe - Recommend daily broad spectrum sunscreen SPF 30+ to sun-exposed areas, reapply every 2 hours as needed. - Call for any changes   Return in about 6 months (around  03/19/2023) for TBSE, HxBCC.  I, Emelia Salisbury, CMA, am acting as scribe for Forest Gleason, MD.  Documentation: I have reviewed the above documentation for accuracy and completeness, and I agree with the above.  Forest Gleason, MD

## 2022-09-17 NOTE — Patient Instructions (Signed)
Electrodesiccation and Curettage ("Scrape and Burn") Wound Care Instructions  Leave the original bandage on for 24 hours if possible.  If the bandage becomes soaked or soiled before that time, it is OK to remove it and examine the wound.  A small amount of post-operative bleeding is normal.  If excessive bleeding occurs, remove the bandage, place gauze over the site and apply continuous pressure (no peeking) over the area for 30 minutes. If this does not work, please call our clinic as soon as possible or page your doctor if it is after hours.   Once a day, cleanse the wound with soap and water. It is fine to shower. If a thick crust develops you may use a Q-tip dipped into dilute hydrogen peroxide (mix 1:1 with water) to dissolve it.  Hydrogen peroxide can slow the healing process, so use it only as needed.    After washing, apply petroleum jelly (Vaseline) or an antibiotic ointment if your doctor prescribed one for you, followed by a bandage.    For best healing, the wound should be covered with a layer of ointment at all times. If you are not able to keep the area covered with a bandage to hold the ointment in place, this may mean re-applying the ointment several times a day.  Continue this wound care until the wound has healed and is no longer open. It may take several weeks for the wound to heal and close.  Itching and mild discomfort is normal during the healing process.  If you have any discomfort, you can take Tylenol (acetaminophen) or ibuprofen as directed on the bottle. (Please do not take these if you have an allergy to them or cannot take them for another reason).  Some redness, tenderness and white or yellow material in the wound is normal healing.  If the area becomes very sore and red, or develops a thick yellow-green material (pus), it may be infected; please notify us.    Wound healing continues for up to one year following surgery. It is not unusual to experience pain in the scar  from time to time during the interval.  If the pain becomes severe or the scar thickens, you should notify the office.    A slight amount of redness in a scar is expected for the first six months.  After six months, the redness will fade and the scar will soften and fade.  The color difference becomes less noticeable with time.  If there are any problems, return for a post-op surgery check at your earliest convenience.  To improve the appearance of the scar, you can use silicone scar gel, cream, or sheets (such as Mederma or Serica) every night for up to one year. These are available over the counter (without a prescription).  Please call our office at [phone] for any questions or concerns.     Due to recent changes in healthcare laws, you may see results of your pathology and/or laboratory studies on MyChart before the doctors have had a chance to review them. We understand that in some cases there may be results that are confusing or concerning to you. Please understand that not all results are received at the same time and often the doctors may need to interpret multiple results in order to provide you with the best plan of care or course of treatment. Therefore, we ask that you please give us 2 business days to thoroughly review all your results before contacting the office for clarification. Should   we see a critical lab result, you will be contacted sooner.   If You Need Anything After Your Visit  If you have any questions or concerns for your doctor, please call our main line at [phone] and press option 4 to reach your doctor's medical assistant. If no one answers, please leave a voicemail as directed and we will return your call as soon as possible. Messages left after 4 pm will be answered the following business day.   You may also send us a message via MyChart. We typically respond to MyChart messages within 1-2 business days.  For prescription refills, please ask your  pharmacy to contact our office. Our fax number is [phone].  If you have an urgent issue when the clinic is closed that cannot wait until the next business day, you can page your doctor at the number below.    Please note that while we do our best to be available for urgent issues outside of office hours, we are not available 24/7.   If you have an urgent issue and are unable to reach us, you may choose to seek medical care at your doctor's office, retail clinic, urgent care center, or emergency room.  If you have a medical emergency, please immediately call 911 or go to the emergency department.  Pager Numbers  - Dr. Kowalski: [phone]  - Dr. Moye: [phone]  - Dr. Stewart: [phone]  In the event of inclement weather, please call our main line at [phone] for an update on the status of any delays or closures.  Dermatology Medication Tips: Please keep the boxes that topical medications come in in order to help keep track of the instructions about where and how to use these. Pharmacies typically print the medication instructions only on the boxes and not directly on the medication tubes.   If your medication is too expensive, please contact our office at [phone] option 4 or send us a message through MyChart.   We are unable to tell what your co-pay for medications will be in advance as this is different depending on your insurance coverage. However, we may be able to find a substitute medication at lower cost or fill out paperwork to get insurance to cover a needed medication.   If a prior authorization is required to get your medication covered by your insurance company, please allow us 1-2 business days to complete this process.  Drug prices often vary depending on where the prescription is filled and some pharmacies may offer cheaper prices.  The website www.goodrx.com contains coupons for medications through different pharmacies. The prices here do not  account for what the cost may be with help from insurance (it may be cheaper with your insurance), but the website can give you the price if you did not use any insurance.  - You can print the associated coupon and take it with your prescription to the pharmacy.  - You may also stop by our office during regular business hours and pick up a GoodRx coupon card.  - If you need your prescription sent electronically to a different pharmacy, notify our office through [facility] MyChart or by phone at [phone] option 4.     Si Usted Necesita Algo Despus de Su Visita  Tambin puede enviarnos un mensaje a travs de MyChart. Por lo general respondemos a los mensajes de MyChart en el transcurso de 1 a 2 das hbiles.  Para renovar recetas, por favor pida a su farmacia que se ponga en   contacto con nuestra oficina. Nuestro nmero de fax es el [phone].  Si tiene un asunto urgente cuando la clnica est cerrada y que no puede esperar hasta el siguiente da hbil, puede llamar/localizar a su doctor(a) al nmero que aparece a continuacin.   Por favor, tenga en cuenta que aunque hacemos todo lo posible para estar disponibles para asuntos urgentes fuera del horario de oficina, no estamos disponibles las 24 horas del da, los 7 das de la semana.   Si tiene un problema urgente y no puede comunicarse con nosotros, puede optar por buscar atencin mdica  en el consultorio de su doctor(a), en una clnica privada, en un centro de atencin urgente o en una sala de emergencias.  Si tiene una emergencia mdica, por favor llame inmediatamente al 911 o vaya a la sala de emergencias.  Nmeros de bper  - Dr. Kowalski: [phone]  - Dra. Moye: [phone]  - Dra. Stewart: [phone]  En caso de inclemencias del tiempo, por favor llame a nuestra lnea principal al [phone] para una actualizacin sobre el estado de cualquier retraso o cierre.  Consejos para la medicacin en dermatologa: Por  favor, guarde las cajas en las que vienen los medicamentos de uso tpico para ayudarle a seguir las instrucciones sobre dnde y cmo usarlos. Las farmacias generalmente imprimen las instrucciones del medicamento slo en las cajas y no directamente en los tubos del medicamento.   Si su medicamento es muy caro, por favor, pngase en contacto con nuestra oficina llamando al [phone] y presione la opcin 4 o envenos un mensaje a travs de MyChart.   No podemos decirle cul ser su copago por los medicamentos por adelantado ya que esto es diferente dependiendo de la cobertura de su seguro. Sin embargo, es posible que podamos encontrar un medicamento sustituto a menor costo o llenar un formulario para que el seguro cubra el medicamento que se considera necesario.   Si se requiere una autorizacin previa para que su compaa de seguros cubra su medicamento, por favor permtanos de 1 a 2 das hbiles para completar este proceso.  Los precios de los medicamentos varan con frecuencia dependiendo del lugar de dnde se surte la receta y alguna farmacias pueden ofrecer precios ms baratos.  El sitio web www.goodrx.com tiene cupones para medicamentos de diferentes farmacias. Los precios aqu no tienen en cuenta lo que podra costar con la ayuda del seguro (puede ser ms barato con su seguro), pero el sitio web puede darle el precio si no utiliz ningn seguro.  - Puede imprimir el cupn correspondiente y llevarlo con su receta a la farmacia.  - Tambin puede pasar por nuestra oficina durante el horario de atencin regular y recoger una tarjeta de cupones de GoodRx.  - Si necesita que su receta se enve electrnicamente a una farmacia diferente, informe a nuestra oficina a travs de MyChart de [place] o por telfono llamando al [phone] y presione la opcin 4.  

## 2022-09-18 ENCOUNTER — Encounter: Payer: Self-pay | Admitting: Dermatology

## 2022-09-23 DIAGNOSIS — M25511 Pain in right shoulder: Secondary | ICD-10-CM | POA: Diagnosis not present

## 2022-09-23 DIAGNOSIS — M25611 Stiffness of right shoulder, not elsewhere classified: Secondary | ICD-10-CM | POA: Diagnosis not present

## 2022-09-30 ENCOUNTER — Ambulatory Visit: Payer: Medicare Other | Admitting: Dermatology

## 2022-10-06 ENCOUNTER — Other Ambulatory Visit: Payer: Self-pay | Admitting: Cardiovascular Disease

## 2022-10-06 DIAGNOSIS — R079 Chest pain, unspecified: Secondary | ICD-10-CM

## 2022-10-14 DIAGNOSIS — M25611 Stiffness of right shoulder, not elsewhere classified: Secondary | ICD-10-CM | POA: Diagnosis not present

## 2022-10-14 DIAGNOSIS — M25511 Pain in right shoulder: Secondary | ICD-10-CM | POA: Diagnosis not present

## 2022-10-19 NOTE — Progress Notes (Unsigned)
Cardiology Office Note    Date:  10/21/2022   ID:  [name] [name], DOB July 28, 1951, MRN [id number]  PCP:  Gwyneth Sprout, FNP  Cardiologist:  Kathlyn Sacramento, MD  Electrophysiologist:  None   Chief Complaint: Follow-up  History of Present Illness:   [name] [name] is a 71 y.o. 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   He was previously managed by Dr. Dion Saucier in [place], [place].  He was initially diagnosed with A. fib in 2004 and was managed with metoprolol though this was discontinued secondary to nightmares.  Since then, he has been managed with diltiazem.  Prior stress echo in 2013 was unremarkable.  Holter monitor in 12/2014 showed the predominant rhythm was sinus with an average heart rate of 71 bpm, minimum heart rate 45 bpm, maximum heart rate 118 bpm, rare isolated PVCs, frequent isolated PACs totaling 4587, 6 atrial couplets, 3 runs of SVT with the longest lasting 7 beats with a maximum rate of 141 bpm, no significant pauses.  No evidence of A. Fib.  He underwent treadmill stress test on 03/16/2018 that showed no evidence of ischemia with good exercise capacity with an exercise duration of 7 minutes and 48 seconds.  He was evaluated in 04/2019 for palpitations and chest pain.  Echo showed an EF of 55-60%, mild concentric LVH, normal diastolic function, normal RVSF, grossly normal mitral and tricuspid valves, mild AI.  Subsequent Zio in 05/2019 showed NSR with an average heart rate of 73 bpm with 27 episodes of short SVT with the longest episode lasting 13 beats.  He underwent coronary CTA in 06/2019, which showed left  main FFR 0.98, ostial LAD FFR 0.98, proximal LAD FFR 0.50, proximal LCx FFR 0.95, mid LCx FFR 0.78, OM1 FFR 0.77, proximal RCA FFR 0.98, mid RCA FFR 0.82, PDA FFR 0.79. Given this, he underwent diagnostic LHC on 06/26/2019 that showed severe single-vessel CAD with 80-90% proximal LAD stenosis with heavy calcification as well as mild to moderate nonobstructive CAD involving the mid LAD, mid LCx, and mid RCA with elevated LVEDP and normal LV contraction consistent with diastolic dysfunction. Given the heavy calcification of the LAD, it was recommended he undergo staged PCI with atherectomy. He underwent successful complex orbital atherectomy and 2 overlapping DES to the proximal and mid LAD extending into the ostium with balloon angioplasty of the ostial D2. Procedure was complicated by a non-flow limiting dissection in the ostial diagonal with normal flow that did not require treatment.    He was seen in the office in 07/2021 and was doing well from a cardiac perspective without symptoms of angina or decompensation.  He noted an improvement in his overall palpitation burden following prior titration of Cardizem CD to 240 mg daily.     He was seen on 03/19/2022 noting some palpitations and chest/leg heaviness and fatigue that felt similar to what he experienced leading up to his PCI.  At the time of his visit, his palpitation burden had improved.  Subsequent Lexiscan MPI on 03/25/2022 showed no evidence of ischemia with an EF of 56% and was overall low risk.  LAD and RCA calcifications were noted on CT  imaging.   He was last seen in the office in 04/2022 and was without symptoms of angina or decompensation.  He also noted less palpitations, though did continue to have some in the p.m.  Given this, Cardizem CD was changed to 120 mg twice daily.  He comes in doing well from a cardiac perspective and is without symptoms of angina or decompensation.  No dyspnea, dizziness, presyncope, or syncope.  He has noted an  improvement in palpitation burden with taking Cardizem CD 120 mg twice daily rather than 240 mg daily.  With this, however he has noted an increase in lower extremity swelling that is progressive throughout the day and improves when laying supine overnight.  He has attempted compression socks, though this causes the swelling to accumulate above the sock with some associated tightness.  No abdominal distention or progressive orthopnea.  No falls or symptoms concerning for bleeding.   Labs independently reviewed: 05/2022 - potassium 3.8, BUN 21, serum creatinine 0.75, Hgb 14.5, PLT 168 03/2022 - albumin 4.2, AST/ALT normal, TSH normal, TC 145, TG 104, HDL 47, LDL 77   Past Medical History:  Diagnosis Date   Acquired hypothyroidism    Aortic atherosclerosis ([place])    Basal cell carcinoma 08/13/2022   Left dorsal forearm. Superficial and nodular. EDC pending.   Coronary artery disease    a.) LHC 06/26/2019: EF 55-60%; LVEDP 25-30%; 80 pLAD, 40% mLAD, 40% p-mLCx, 30% mRCA; staged PCI planned. b.) LHC/staged PCI 07/05/2019: 55% mLAD, 40% p-mLCx, 30% mRCA, 85% p-mLAD, 50% D2 --> orbital atherectomy and PCI performed placing overlapping 3.0 x 8 mm (pLAD) and 3.0 x 30 mm (mLAD) Resolute Onyx DES x 2   Diverticulosis    ED (erectile dysfunction)    a.) on PDE5i (tadalifil)   Elevated LFTs    GERD (gastroesophageal reflux disease)    HLD (hyperlipidemia)    Hypertension    Long term current use of antithrombotics/antiplatelets    a.) daily DAPT therapy (ASA + clopidogrel)   Melanoma ([place])    Left shoulder. 1984   PAF (paroxysmal atrial fibrillation) ([place]) 2004   a.) CHA2DS2-VASc = 3 (age, HTN, aortic plaque). b.) rate/rhythm maintained on oral diltiazem; on daily DAPT (ASA + clopidogrel)   Paroxysmal SVT (supraventricular tachycardia)    Pure hypercholesterolemia    Skin cancer    Left forearm. NMSC. Removed 2008 or prior    Past Surgical History:  Procedure Laterality Date   CORONARY  ATHERECTOMY N/A 07/05/2019   Procedure: CORONARY ATHERECTOMY;  Surgeon: Wellington Hampshire, MD;  Location: [facility];  Service: Cardiovascular;  Laterality: N/A;   CORONARY BALLOON ANGIOPLASTY N/A 07/05/2019   Procedure: CORONARY BALLOON ANGIOPLASTY;  Surgeon: Wellington Hampshire, MD;  Location: [facility];  Service: Cardiovascular;  Laterality: N/A;   CORONARY STENT INTERVENTION N/A 07/05/2019   Procedure: CORONARY STENT INTERVENTION;  Surgeon: Wellington Hampshire, MD;  Location: [facility];  Service: Cardiovascular;  Laterality: N/A;   EYE SURGERY Right 2014   Retina detachment   LEFT HEART CATH AND CORONARY ANGIOGRAPHY Left 06/26/2019   Procedure: LEFT HEART CATH AND CORONARY ANGIOGRAPHY;  Surgeon: Nelva Bush, MD;  Location: [facility];  Service: Cardiovascular;  Laterality: Left;   MELANOMA EXCISION  02/17/1983   NECK SURGERY  2004   SHOULDER ARTHROSCOPY WITH SUBACROMIAL DECOMPRESSION, ROTATOR CUFF REPAIR AND BICEP TENDON REPAIR Right 07/07/2022   Procedure: SUBACROMIAL DECOMPRESSION, ROTATOR CUFF REPAIR AND DISTAL CLAVICLE INCISION AND DEBRIDEMENT  distal clavicle incision, debridement;  Surgeon: Lovell Sheehan, MD;  Location: [facility];  Service: Orthopedics;  Laterality: Right;   XI ROBOTIC ASSISTED INGUINAL HERNIA REPAIR WITH MESH Bilateral 04/18/2020   Procedure: XI ROBOTIC ASSISTED INGUINAL HERNIA REPAIR WITH MESH;  Surgeon: Jules Husbands, MD;  Location: [facility];  Service: General;  Laterality: Bilateral;    Current Medications: Current Meds  Medication Sig   acetaminophen (TYLENOL) 500 MG tablet Take 500 mg by mouth every 6 (six) hours as needed.   aspirin EC 81 MG tablet Take 81 mg by mouth daily.    cetirizine (ZYRTEC) 10 MG tablet Take 10 mg by mouth at bedtime.    clopidogrel (PLAVIX) 75 MG tablet Take 1 tablet (75 mg total) by mouth daily.   diltiazem (CARDIZEM CD) 120 MG 24 hr capsule Take 1 capsule (120 mg total) by mouth in the  morning and at bedtime.   ezetimibe (ZETIA) 10 MG tablet TAKE 1 TABLET BY MOUTH ONCE DAILY IN THE EVENING   famotidine (PEPCID) 20 MG tablet Take 20 mg by mouth daily.   furosemide (LASIX) 20 MG tablet Take 1 tablet (20 mg total) by mouth daily.   levothyroxine (SYNTHROID) 25 MCG tablet Take 1 tablet (25 mcg total) by mouth daily before breakfast.   nitroGLYCERIN (NITROSTAT) 0.4 MG SL tablet Place 1 tablet (0.4 mg total) under the tongue every 5 (five) minutes as needed for chest pain.   rosuvastatin (CRESTOR) 40 MG tablet Take 1 tablet by mouth once daily   tadalafil (CIALIS) 20 MG tablet 1 tab 1 hour prior to intercourse   VITAMIN D, CHOLECALCIFEROL, PO Take by mouth daily.    Allergies:   Hydrocodone-acetaminophen and Vicodin [hydrocodone-acetaminophen]   Social History   Socioeconomic History   Marital status: Married    Spouse name: Morey Hummingbird    Number of children: 2   Years of education: Not on file   Highest education level: Not on file  Occupational History   Not on file  Tobacco Use   Smoking status: Never   Smokeless tobacco: Never  Vaping Use   Vaping Use: Never used  Substance and Sexual Activity   Alcohol use: Yes    Comment: Occ.    Drug use: No   Sexual activity: Yes    Birth control/protection: None  Other Topics Concern   Not on file  Social History Narrative   Not on file   Social Determinants of Health   Financial Resource Strain: Not on file  Food Insecurity: No Food Insecurity (06/26/2019)   Hunger Vital Sign    Worried About Running Out of Food in the Last Year: Never true    Ran Out of Food in the Last Year: Never true  Transportation Needs: No Transportation Needs (06/26/2019)   PRAPARE - Hydrologist (Medical): No    Lack of Transportation (Non-Medical): No  Physical Activity: Not on file  Stress: No Stress Concern Present (06/26/2019)   [place]     Feeling of Stress : Only a little  Social Connections: Unknown (06/26/2019)   Social Connection and Isolation Panel [NHANES]    Frequency of Communication with Friends and Family: More than three times a week    Frequency of Social Gatherings with Friends and Family: Not on file    Attends Religious Services: Not on file    Active Member of Clubs or Organizations: Not on file  Attends Archivist Meetings: Not on file    Marital Status: Not on file     Family History:  The patient's family history includes Atrial fibrillation in his mother; Heart disease in his father; Stroke in his father.  ROS:   12-point review of systems is negative unless otherwise noted in HPI   EKGs/Labs/Other Studies Reviewed:    Studies reviewed were summarized above. The additional studies were reviewed today:  Lexiscan MPI 03/25/2022:   . The study is low risk.   There is no evidence for ischemia   Left ventricular function is normal. LVEF = 56%   LAD and RCA calcifications noted. __________   2D Echo 04/2018: 1. The left ventricle has normal systolic function, with an ejection fraction of 55-60%. The cavity size was normal. There is mild concentric left ventricular hypertrophy. Left ventricular diastolic parameters were normal.  2. The right ventricle has normal systolic function. The cavity was normal. There is no increase in right ventricular wall thickness.  3. The mitral valve is grossly normal.  4. The tricuspid valve is grossly normal.  5. The aortic valve has an indeterminate number of cusps. Aortic valve regurgitation is mild by color flow Doppler. __________   CTA chest/abd/pelvis: 1. No dissection.  No PE.  No aortic aneurysm. 2. Atherosclerotic changes are noted throughout the thoracic and abdominal aorta. 3. No acute intra-abdominal abnormality. 4. Severe sigmoid diverticulosis without CT evidence of diverticulitis. 5. Enlarged prostate gland. Bilateral fat containing  inguinal hernias are noted.   Zio 05/2019: Normal sinus rhythm with an average heart rate of 73 bpm. 27 episodes of short SVTs.  The longest lasted 13 beats. __________   Coronary CTA/FFR: 1. Left Main: 0.98. 2. LAD: Ostial: 0.98, proximal: 0.50. 3. LCX: Proximal: 0.95, mid: 078. 4. OM1: 0.77. 5. RCA: Proximal: 0.98, mid: 0.82, PDA: 0.79.   IMPRESSION: 1. CT FFR analysis showed severe stenosis in the proximal LAD, mid RCA, mid LCX artery. A cardiac catheterization is recommended. __________   [facility] 06/2019: Conclusions: Severe single-vessel CAD with 80-90% proximal LAD stenosis with heavy calcification. Mild to moderate, non-obstructive CAD involving mid LAD, mid LCx, and mid RCA. Elevated LVEDP with normal left ventricular contraction consistent with diastolic dysfunction.   Recommendations: Plan for staged PCI to proximal LAD at [facility] using atherectomy.  Will start patient on dual antiplatelet therapy with aspirin and clopidogrel.  Long-term, transitioning Mr. Jaquay to NOAC + clopidogrel will need to be considered with his history of paroxysmal atrial fibrillation and CHADSVASC score of at least 2 (age + CAD). Aggressive secondary prevention. __________   PCI 06/2019: Mid LAD lesion is 55% stenosed. Prox Cx to Mid Cx lesion is 40% stenosed. Mid RCA lesion is 30% stenosed. Prox LAD to Mid LAD lesion is 85% stenosed. 2nd Diag lesion is 50% stenosed. Post intervention, there is a 0% residual stenosis. A drug-eluting stent was successfully placed using a STENT RESOLUTE OYDX4.1O87. Post intervention, there is a 0% residual stenosis. A drug-eluting stent was successfully placed using a STENT RESOLUTE ONYX 3.0X8. Post intervention, there is a 30% residual stenosis. Balloon angioplasty was performed using a BALLOON SAPPHIRE 2.5X20.   Successful complex orbital atherectomy and 2 overlapped drug-eluting stent placement to the mid and proximal LAD extending into the ostium with  balloon angioplasty of the ostial second diagonal.  There was non-flow-limiting dissection in ostial diagonal with normal flow.  This did not require treatment.   Recommendations: Dual antiplatelet therapy for at least 1  year.  Aggressive treatment of risk factors.   EKG:  EKG is ordered today.  The EKG ordered today demonstrates NSR, 64 bpm, no acute ST-T changes, no changes when compared to prior  Recent Labs: 03/25/2022: ALT 32; TSH 2.341 05/26/2022: BUN 21; Creatinine, Ser 0.75; Hemoglobin 14.5; Platelets 168; Potassium 3.8; Sodium 141  Recent Lipid Panel    Component Value Date/Time   CHOL 145 03/25/2022 1025   CHOL 135 08/28/2020 0925   TRIG 104 03/25/2022 1025   HDL 47 03/25/2022 1025   HDL 46 08/28/2020 0925   CHOLHDL 3.1 03/25/2022 1025   VLDL 21 03/25/2022 1025   LDLCALC 77 03/25/2022 1025   LDLCALC 71 08/28/2020 0925    PHYSICAL EXAM:    VS:  BP 124/60 (BP Location: Left Arm, Patient Position: Sitting, Cuff Size: Normal)   Pulse 64   Ht 6' (1.829 m)   Wt 210 lb 9.6 oz (95.5 kg)   SpO2 95%   BMI 28.56 kg/m   BMI: Body mass index is 28.56 kg/m.  Physical Exam Vitals reviewed.  Constitutional:      Appearance: He is well-developed.  HENT:     Head: Normocephalic and atraumatic.  Eyes:     General:        Right eye: No discharge.        Left eye: No discharge.  Neck:     Vascular: No JVD.  Cardiovascular:     Rate and Rhythm: Normal rate and regular rhythm.     Heart sounds: Normal heart sounds, S1 normal and S2 normal. Heart sounds not distant. No midsystolic click and no opening snap. No murmur heard.    No friction rub.  Pulmonary:     Effort: Pulmonary effort is normal. No respiratory distress.     Breath sounds: Normal breath sounds. No decreased breath sounds, wheezing or rales.  Chest:     Chest wall: No tenderness.  Abdominal:     General: There is no distension.  Musculoskeletal:     Cervical back: Normal range of motion.     Comments:  Trivial bilateral pretibial edema.  Skin:    General: Skin is warm and dry.     Nails: There is no clubbing.  Neurological:     Mental Status: He is alert and oriented to person, place, and time.  Psychiatric:        Speech: Speech normal.        Behavior: Behavior normal.        Thought Content: Thought content normal.        Judgment: Judgment normal.     Wt Readings from Last 3 Encounters:  10/21/22 210 lb 9.6 oz (95.5 kg)  07/20/22 200 lb (90.7 kg)  07/07/22 204 lb (92.5 kg)     ASSESSMENT & PLAN:   CAD involving the native coronary arteries without angina: He continues to do well and is without symptoms of angina or decompensation.  Recent low risk Lexiscan MPI.  Continue aggressive risk factor modification and secondary prevention including long-term DAPT as tolerated, diltiazem, ezetimibe, and rosuvastatin.  No indication for further ischemic testing at this time.  PAF/PSVT: Improved palpitations on Cardizem CD 120 mg twice daily.  Diastolic dysfunction/lower extremity swelling: He does note some lower extremity swelling that is progressive throughout the day when he is up on his feet and improves when laying supine.  Suspect some of this is related to calcium channel blocker usage.  However, he has previously been noted  to be intolerant to beta-blocker secondary to nightmares.  Recommend leg elevation, compression stocking, and initiation of low-dose furosemide 20 mg daily with a follow-up CMP in 7 to 10 days to ensure stable potassium and renal function.  HLD with familial hyperlipidemia: LDL 77.  He remains on rosuvastatin 40 mg and ezetimibe.  Follow-up CMP and fasting lipid panel in 7 to 10 days when he has labs drawn as outlined above with recommendation to escalate lipid-lowering therapy as indicated with possible addition of PCSK9 inhibitor to achieve target LDL.   Disposition: F/u with Dr. Fletcher Anon or an APP in 6 months.   Medication Adjustments/Labs and Tests  Ordered: Current medicines are reviewed at length with the patient today.  Concerns regarding medicines are outlined above. Medication changes, Labs and Tests ordered today are summarized above and listed in the Patient Instructions accessible in Encounters.   Signed, Christell Faith, PA-C 10/21/2022 11:21 AM     [facility] [place] [address] [place] [address] [place], [place] [address] [phone]

## 2022-10-21 ENCOUNTER — Ambulatory Visit: Payer: Medicare Other | Attending: Physician Assistant | Admitting: Physician Assistant

## 2022-10-21 ENCOUNTER — Encounter: Payer: Self-pay | Admitting: Physician Assistant

## 2022-10-21 VITALS — BP 124/60 | HR 64 | Ht 72.0 in | Wt 210.6 lb

## 2022-10-21 DIAGNOSIS — E785 Hyperlipidemia, unspecified: Secondary | ICD-10-CM

## 2022-10-21 DIAGNOSIS — E7849 Other hyperlipidemia: Secondary | ICD-10-CM

## 2022-10-21 DIAGNOSIS — I2510 Atherosclerotic heart disease of native coronary artery without angina pectoris: Secondary | ICD-10-CM | POA: Diagnosis not present

## 2022-10-21 DIAGNOSIS — I480 Paroxysmal atrial fibrillation: Secondary | ICD-10-CM

## 2022-10-21 DIAGNOSIS — I471 Supraventricular tachycardia, unspecified: Secondary | ICD-10-CM | POA: Diagnosis not present

## 2022-10-21 DIAGNOSIS — I5189 Other ill-defined heart diseases: Secondary | ICD-10-CM

## 2022-10-21 DIAGNOSIS — M7989 Other specified soft tissue disorders: Secondary | ICD-10-CM

## 2022-10-21 MED ORDER — FUROSEMIDE 20 MG PO TABS: 20.0000 mg | ORAL_TABLET | Freq: Every day | ORAL | 3 refills | Status: DC

## 2022-10-21 NOTE — Patient Instructions (Addendum)
Medication Instructions:  Your physician has recommended you make the following change in your medication:   START Furosemide 20 mg once daily   *If you need a refill on your cardiac medications before your next appointment, please call your pharmacy*   Lab Work: CMET & Lipid panel to be done in 7-10 days. This will be fasting labs so nothing to eat or drink after midnight before except sip of water with medications.  No appointment is needed. Go to the following location: Medical Mall Entrance at [facility] 1st desk on the right to check in (REGISTRATION)  Lab hours: Monday- Friday (7:30 am- 5:30 pm)  If you have labs (blood work) drawn today and your tests are completely normal, you will receive your results only by: [facility] (if you have MyChart) OR A paper copy in the mail If you have any lab test that is abnormal or we need to change your treatment, we will call you to review the results.   Testing/Procedures: None   Follow-Up: At [facility], you and your health needs are our priority.  As part of our continuing mission to provide you with exceptional heart care, we have created designated Provider Care Teams.  These Care Teams include your primary Cardiologist (physician) and Advanced Practice Providers (APPs -  Physician Assistants and Nurse Practitioners) who all work together to provide you with the care you need, when you need it.   Your next appointment:   6 month(s)  The format for your next appointment:   In Person  Provider:   Kathlyn Sacramento, MD or Christell Faith, PA-C        Important Information About Sugar

## 2022-11-25 DIAGNOSIS — H43812 Vitreous degeneration, left eye: Secondary | ICD-10-CM | POA: Diagnosis not present

## 2022-11-25 DIAGNOSIS — H35341 Macular cyst, hole, or pseudohole, right eye: Secondary | ICD-10-CM | POA: Diagnosis not present

## 2022-11-25 DIAGNOSIS — H2513 Age-related nuclear cataract, bilateral: Secondary | ICD-10-CM | POA: Diagnosis not present

## 2022-11-25 DIAGNOSIS — Z8669 Personal history of other diseases of the nervous system and sense organs: Secondary | ICD-10-CM | POA: Diagnosis not present

## 2023-01-01 DIAGNOSIS — M25511 Pain in right shoulder: Secondary | ICD-10-CM | POA: Diagnosis not present

## 2023-01-01 DIAGNOSIS — M25611 Stiffness of right shoulder, not elsewhere classified: Secondary | ICD-10-CM | POA: Diagnosis not present

## 2023-01-06 ENCOUNTER — Telehealth: Payer: Self-pay | Admitting: Cardiovascular Disease

## 2023-01-06 NOTE — Telephone Encounter (Signed)
Attempted to contact pt several times. Continue to receive message stating your call can not be completed as dialed.

## 2023-01-06 NOTE — Telephone Encounter (Signed)
Pt c/o medication issue:  1. Name of Medication: furosemide (LASIX) 20 MG tablet [[id number]]   2. How are you currently taking this medication (dosage and times per day)? NA       3. Are you having a reaction (difficulty breathing--STAT)? NA    4. What is your medication issue? Pt state he was getting headache and blurred vision and effected his driving.  He wanted to let Dr Fletcher Anon know that he only took this a week.  Side is all gone now    Best number -[phone]

## 2023-01-08 DIAGNOSIS — M25511 Pain in right shoulder: Secondary | ICD-10-CM | POA: Diagnosis not present

## 2023-01-08 DIAGNOSIS — M25611 Stiffness of right shoulder, not elsewhere classified: Secondary | ICD-10-CM | POA: Diagnosis not present

## 2023-01-08 NOTE — Telephone Encounter (Signed)
Attempted to call patient from different phones and keep receiving message "Unable to complete phone call at this time"

## 2023-01-15 ENCOUNTER — Other Ambulatory Visit: Payer: Self-pay | Admitting: Family Medicine

## 2023-01-15 DIAGNOSIS — M25611 Stiffness of right shoulder, not elsewhere classified: Secondary | ICD-10-CM | POA: Diagnosis not present

## 2023-01-15 DIAGNOSIS — E039 Hypothyroidism, unspecified: Secondary | ICD-10-CM

## 2023-01-15 DIAGNOSIS — M25511 Pain in right shoulder: Secondary | ICD-10-CM | POA: Diagnosis not present

## 2023-01-15 NOTE — Telephone Encounter (Signed)
Requested medication (s) are due for refill today -expired Rx  Requested medication (s) are on the active medication list -yes  Future visit scheduled -no  Last refill: 11/12/21 #90 3RF  Notes to clinic: expired Rx  Requested Prescriptions  Pending Prescriptions Disp Refills   levothyroxine (SYNTHROID) 25 MCG tablet [Pharmacy Med Name: Levothyroxine Sodium 25 MCG Oral Tablet] 90 tablet 0    Sig: TAKE 1 TABLET BY MOUTH BEFORE BREAKFAST     Endocrinology:  Hypothyroid Agents Passed - 01/15/2023 11:36 AM      Passed - TSH in normal range and within 360 days    TSH  Date Value Ref Range Status  03/25/2022 2.341 0.350 - 4.500 uIU/mL Final    Comment:    Performed by a 3rd Generation assay with a functional sensitivity of <=0.01 uIU/mL. Performed at [facility], [address] [place], [place] [address]   08/28/2020 3.580 0.450 - 4.500 uIU/mL Final         Passed - Valid encounter within last 12 months    Recent Outpatient Visits           5 months ago Herpes zoster without complication   [place], DO   7 months ago [place] Tally Joe T, FNP   1 year ago Atherosclerotic heart disease of native coronary artery with other forms of angina pectoris [facility])   [place] Gwyneth Sprout, FNP   1 year ago Viral URI   [place] Eulas Post, MD   1 year ago Closed nondisplaced fracture of right calcaneus with routine healing, unspecified portion of calcaneus, subsequent encounter   [place] Trinna Post, [place]       Future Appointments             In 2 weeks Bernardo Heater, Ronda Fairly, MD [place]   In 3 months Fletcher Anon, Mertie Clause, MD [place] [facility]               Requested Prescriptions  Pending Prescriptions Disp Refills   levothyroxine  (SYNTHROID) 25 MCG tablet [Pharmacy Med Name: Levothyroxine Sodium 25 MCG Oral Tablet] 90 tablet 0    Sig: TAKE 1 TABLET BY MOUTH BEFORE BREAKFAST     Endocrinology:  Hypothyroid Agents Passed - 01/15/2023 11:36 AM      Passed - TSH in normal range and within 360 days    TSH  Date Value Ref Range Status  03/25/2022 2.341 0.350 - 4.500 uIU/mL Final    Comment:    Performed by a 3rd Generation assay with a functional sensitivity of <=0.01 uIU/mL. Performed at [facility], [address] [place],  [address]   08/28/2020 3.580 0.450 - 4.500 uIU/mL Final         Passed - Valid encounter within last 12 months    Recent Outpatient Visits           5 months ago Herpes zoster without complication   [place], DO   7 months ago [place] Tally Joe T, FNP   1 year ago Atherosclerotic heart disease of native coronary artery with other forms of angina pectoris [facility])   [place] Tally Joe T, FNP   1 year ago Viral URI   [facility]  [place] Eulas Post, MD   1 year ago Closed nondisplaced fracture of right calcaneus with routine healing, unspecified portion of calcaneus, subsequent encounter   [place], Wendee Beavers, [place]       Future Appointments             In 2 weeks Stoioff, Ronda Fairly, MD [place]   In 3 months Fletcher Anon, Mertie Clause, MD [place] at [facility]

## 2023-01-29 ENCOUNTER — Ambulatory Visit: Payer: Medicare Other | Admitting: Urology

## 2023-01-29 DIAGNOSIS — M25511 Pain in right shoulder: Secondary | ICD-10-CM | POA: Diagnosis not present

## 2023-01-29 DIAGNOSIS — M25611 Stiffness of right shoulder, not elsewhere classified: Secondary | ICD-10-CM | POA: Diagnosis not present

## 2023-02-04 NOTE — Progress Notes (Unsigned)
02/05/2023 10:46 AM   [name] [name] October 14, 1951 [id number]  Referring provider: Gwyneth Sprout, [place],  [place] [address]  Urological history: 1.  Erectile dysfunction -Contributing factors of age, hypothyroidism, coronary artery disease, hypercholesterolemia and BPH -SHIM 13 -Tadalafil 20 mg on-demand dosing  Chief Complaint  Patient presents with   Follow-up   Erectile Dysfunction    HPI: [name] [name] is a 72 y.o. male who presents today for discuss ED.    SHIM     Row Name 02/05/23 1045         SHIM: Over the last 6 months:   How do you rate your confidence that you could get and keep an erection? Low     When you had erections with sexual stimulation, how often were your erections hard enough for penetration (entering your partner)? Sometimes (about half the time)     During sexual intercourse, how often were you able to maintain your erection after you had penetrated (entered) your partner? A Few Times (much less than half the time)     During sexual intercourse, how difficult was it to maintain your erection to completion of intercourse? Difficult     When you attempted sexual intercourse, how often was it satisfactory for you? Sometimes (about half the time)       SHIM Total Score   SHIM 13              Score: 1-7 Severe ED 8-11 Moderate ED 12-16 Mild-Moderate ED 17-21 Mild ED 22-25 No ED    PMH: Past Medical History:  Diagnosis Date   Acquired hypothyroidism    Aortic atherosclerosis ([place])    Basal cell carcinoma 08/13/2022   Left dorsal forearm. Superficial and nodular. EDC pending.   Coronary artery disease    a.) LHC 06/26/2019: EF 55-60%; LVEDP 25-30%; 80 pLAD, 40% mLAD, 40% p-mLCx, 30% mRCA; staged PCI planned. b.) LHC/staged PCI 07/05/2019: 55% mLAD, 40% p-mLCx, 30% mRCA, 85% p-mLAD, 50% D2 --> orbital atherectomy and PCI performed placing overlapping 3.0 x 8 mm (pLAD) and 3.0 x 30 mm (mLAD) Resolute Onyx DES x 2    Diverticulosis    ED (erectile dysfunction)    a.) on PDE5i (tadalifil)   Elevated LFTs    GERD (gastroesophageal reflux disease)    HLD (hyperlipidemia)    Hypertension    Long term current use of antithrombotics/antiplatelets    a.) daily DAPT therapy (ASA + clopidogrel)   Melanoma ([place])    Left shoulder. 1984   PAF (paroxysmal atrial fibrillation) ([place]) 2004   a.) CHA2DS2-VASc = 3 (age, HTN, aortic plaque). b.) rate/rhythm maintained on oral diltiazem; on daily DAPT (ASA + clopidogrel)   Paroxysmal SVT (supraventricular tachycardia)    Pure hypercholesterolemia    Skin cancer    Left forearm. NMSC. Removed 2008 or prior    Surgical History: Past Surgical History:  Procedure Laterality Date   CORONARY ATHERECTOMY N/A 07/05/2019   Procedure: CORONARY ATHERECTOMY;  Surgeon: Wellington Hampshire, MD;  Location: [facility];  Service: Cardiovascular;  Laterality: N/A;   CORONARY BALLOON ANGIOPLASTY N/A 07/05/2019   Procedure: CORONARY BALLOON ANGIOPLASTY;  Surgeon: Wellington Hampshire, MD;  Location: [facility];  Service: Cardiovascular;  Laterality: N/A;   CORONARY STENT INTERVENTION N/A 07/05/2019   Procedure: CORONARY STENT INTERVENTION;  Surgeon: Wellington Hampshire, MD;  Location: [facility];  Service: Cardiovascular;  Laterality: N/A;   EYE SURGERY Right 2014  Retina detachment   LEFT HEART CATH AND CORONARY ANGIOGRAPHY Left 06/26/2019   Procedure: LEFT HEART CATH AND CORONARY ANGIOGRAPHY;  Surgeon: Nelva Bush, MD;  Location: [facility];  Service: Cardiovascular;  Laterality: Left;   MELANOMA EXCISION  02/17/1983   NECK SURGERY  2004   SHOULDER ARTHROSCOPY WITH SUBACROMIAL DECOMPRESSION, ROTATOR CUFF REPAIR AND BICEP TENDON REPAIR Right 07/07/2022   Procedure: SUBACROMIAL DECOMPRESSION, ROTATOR CUFF REPAIR AND DISTAL CLAVICLE INCISION AND DEBRIDEMENT     distal clavicle incision, debridement;  Surgeon: Lovell Sheehan, MD;  Location: [facility];   Service: Orthopedics;  Laterality: Right;   XI ROBOTIC ASSISTED INGUINAL HERNIA REPAIR WITH MESH Bilateral 04/18/2020   Procedure: XI ROBOTIC ASSISTED INGUINAL HERNIA REPAIR WITH MESH;  Surgeon: Jules Husbands, MD;  Location: [facility];  Service: General;  Laterality: Bilateral;    Home Medications:  Allergies as of 02/05/2023       Reactions   Hydrocodone-acetaminophen Nausea And Vomiting, Other (See Comments)   Vicodin [hydrocodone-acetaminophen] Nausea And Vomiting        Medication List        Accurate as of February 05, 2023 10:46 AM. If you have any questions, ask your nurse or doctor.          STOP taking these medications    furosemide 20 MG tablet Commonly known as: LASIX Stopped by: Jewel Mcafee, PA-C   predniSONE 10 MG (21) Tbpk tablet Commonly known as: STERAPRED UNI-PAK 21 TAB Stopped by: Jeanet Lupe, PA-C   traMADol 50 MG tablet Commonly known as: Ultram Stopped by: Zara Council, PA-C       TAKE these medications    acetaminophen 500 MG tablet Commonly known as: TYLENOL Take 500 mg by mouth every 6 (six) hours as needed.   aspirin EC 81 MG tablet Take 81 mg by mouth daily.   cetirizine 10 MG tablet Commonly known as: ZYRTEC Take 10 mg by mouth at bedtime.   clopidogrel 75 MG tablet Commonly known as: PLAVIX Take 1 tablet (75 mg total) by mouth daily.   diltiazem 120 MG 24 hr capsule Commonly known as: Cardizem CD Take 1 capsule (120 mg total) by mouth in the morning and at bedtime.   ezetimibe 10 MG tablet Commonly known as: ZETIA TAKE 1 TABLET BY MOUTH ONCE DAILY IN THE EVENING   famotidine 20 MG tablet Commonly known as: PEPCID Take 20 mg by mouth daily.   levothyroxine 25 MCG tablet Commonly known as: SYNTHROID TAKE 1 TABLET BY MOUTH BEFORE BREAKFAST   nitroGLYCERIN 0.4 MG SL tablet Commonly known as: Nitrostat Place 1 tablet (0.4 mg total) under the tongue every 5 (five) minutes as needed for chest pain.    rosuvastatin 40 MG tablet Commonly known as: CRESTOR Take 1 tablet by mouth once daily   tadalafil 20 MG tablet Commonly known as: CIALIS 1 tab 1 hour prior to intercourse   VITAMIN D (CHOLECALCIFEROL) PO Take by mouth daily.        Allergies:  Allergies  Allergen Reactions   Hydrocodone-Acetaminophen Nausea And Vomiting and Other (See Comments)   Vicodin [Hydrocodone-Acetaminophen] Nausea And Vomiting    Family History: Family History  Problem Relation Age of Onset   Atrial fibrillation Mother    Stroke Father    Heart disease Father     Social History:  reports that he has never smoked. He has never been exposed to tobacco smoke. He has never used smokeless tobacco. He reports current alcohol use.  He reports that he does not use drugs.  ROS: Pertinent ROS in HPI  Physical Exam: BP 133/76   Pulse 80   Ht 6' (1.829 m)   Wt 204 lb (92.5 kg)   BMI 27.67 kg/m   Constitutional:  Well nourished. Alert and oriented, No acute distress. HEENT: [place] AT, moist mucus membranes.  Trachea midline Cardiovascular: No clubbing, cyanosis, or edema. Respiratory: Normal respiratory effort, no increased work of breathing. Neurologic: Grossly intact, no focal deficits, moving all 4 extremities. Psychiatric: Normal mood and affect.  Laboratory Data: Lab Results  Component Value Date   WBC 4.8 05/26/2022   HGB 14.5 05/26/2022   HCT 43.5 05/26/2022   MCV 88.2 05/26/2022   PLT 168 05/26/2022    Lab Results  Component Value Date   CREATININE 0.75 05/26/2022    Lab Results  Component Value Date   TSH 2.341 03/25/2022       Component Value Date/Time   CHOL 145 03/25/2022 1025   CHOL 135 08/28/2020 0925   HDL 47 03/25/2022 1025   HDL 46 08/28/2020 0925   CHOLHDL 3.1 03/25/2022 1025   VLDL 21 03/25/2022 1025   LDLCALC 77 03/25/2022 1025   LDLCALC 71 08/28/2020 0925    Lab Results  Component Value Date   AST 32 03/25/2022   Lab Results  Component Value Date    ALT 32 03/25/2022   Urinalysis    Component Value Date/Time   COLORURINE YELLOW (A) 05/26/2022 0900   APPEARANCEUR CLEAR (A) 05/26/2022 0900   LABSPEC 1.017 05/26/2022 0900   PHURINE 5.0 05/26/2022 0900   GLUCOSEU NEGATIVE 05/26/2022 0900   HGBUR NEGATIVE 05/26/2022 0900   BILIRUBINUR NEGATIVE 05/26/2022 0900   KETONESUR NEGATIVE 05/26/2022 0900   PROTEINUR NEGATIVE 05/26/2022 0900   NITRITE NEGATIVE 05/26/2022 0900   LEUKOCYTESUR NEGATIVE 05/26/2022 0900  I have reviewed the labs.   Pertinent Imaging: N/A  Assessment & Plan:  ***  1. Erectile dysfunction - I explained to the patient that in order to achieve an erection it takes good functioning of the nervous system (parasympathetic and rs, sympathetic, sensory and motor), good blood flow into the erectile tissue of the penis and a desire to have sex - I explained that conditions like diabetes, hypertension, coronary artery disease, peripheral vascular disease, smoking, alcohol consumption, age, sleep apnea and BPH can diminish the ability to have an erection - I explained the ED may be a risk marker for underlying CVD and he should follow up with PCP for further studies *** - we will obtain a serum testosterone level at this time; if it is abnormal we will need to repeat the study for confirmation *** - A recent study published in Sex Med 2018 Apr 13 revealed moderate to vigorous aerobic exercise for 40 minutes 4 times per week can decrease erectile problems caused by physical inactivity, obesity, hypertension, metabolic syndrome and/or cardiovascular diseases *** - We discussed trying a *** different PDE5 inhibitor, intra-urethral suppositories, intracavernous vasoactive drug injection therapy, vacuum erection devices, LI-ESWT and penile prosthesis implantation   No follow-ups on file.  These notes generated with voice recognition software. I apologize for typographical errors.  [place], [place] [address]  [place], [place] [address] [phone]

## 2023-02-05 ENCOUNTER — Ambulatory Visit: Payer: Medicare Other | Admitting: Urology

## 2023-02-05 ENCOUNTER — Encounter: Payer: Self-pay | Admitting: Urology

## 2023-02-05 VITALS — BP 133/76 | HR 80 | Ht 72.0 in | Wt 204.0 lb

## 2023-02-05 DIAGNOSIS — N529 Male erectile dysfunction, unspecified: Secondary | ICD-10-CM

## 2023-02-05 MED ORDER — AMBULATORY NON FORMULARY MEDICATION: 0 refills | Status: DC

## 2023-02-05 MED ORDER — SILDENAFIL CITRATE 100 MG PO TABS: 100.0000 mg | ORAL_TABLET | Freq: Every day | ORAL | 3 refills | Status: AC | PRN

## 2023-03-24 ENCOUNTER — Ambulatory Visit (INDEPENDENT_AMBULATORY_CARE_PROVIDER_SITE_OTHER): Payer: Medicare Other | Admitting: Dermatology

## 2023-03-24 ENCOUNTER — Encounter: Payer: Self-pay | Admitting: Dermatology

## 2023-03-24 VITALS — BP 135/71 | HR 69

## 2023-03-24 DIAGNOSIS — L578 Other skin changes due to chronic exposure to nonionizing radiation: Secondary | ICD-10-CM

## 2023-03-24 DIAGNOSIS — L570 Actinic keratosis: Secondary | ICD-10-CM | POA: Diagnosis not present

## 2023-03-24 DIAGNOSIS — B079 Viral wart, unspecified: Secondary | ICD-10-CM | POA: Diagnosis not present

## 2023-03-24 DIAGNOSIS — L219 Seborrheic dermatitis, unspecified: Secondary | ICD-10-CM

## 2023-03-24 DIAGNOSIS — Z85820 Personal history of malignant melanoma of skin: Secondary | ICD-10-CM | POA: Diagnosis not present

## 2023-03-24 DIAGNOSIS — Z1283 Encounter for screening for malignant neoplasm of skin: Secondary | ICD-10-CM

## 2023-03-24 DIAGNOSIS — D485 Neoplasm of uncertain behavior of skin: Secondary | ICD-10-CM

## 2023-03-24 DIAGNOSIS — L814 Other melanin hyperpigmentation: Secondary | ICD-10-CM

## 2023-03-24 DIAGNOSIS — C44719 Basal cell carcinoma of skin of left lower limb, including hip: Secondary | ICD-10-CM | POA: Diagnosis not present

## 2023-03-24 DIAGNOSIS — L821 Other seborrheic keratosis: Secondary | ICD-10-CM

## 2023-03-24 DIAGNOSIS — Z85828 Personal history of other malignant neoplasm of skin: Secondary | ICD-10-CM

## 2023-03-24 NOTE — Progress Notes (Signed)
Follow-Up Visit   Subjective  [name] [name] is a 72 y.o. male who presents for the following: Skin Cancer Screening and Full Body Skin Exam  The patient presents for Total-Body Skin Exam (TBSE) for skin cancer screening and mole check. The patient has spots, moles and lesions to be evaluated, some may be new or changing and the patient has concerns that these could be cancer.  The following portions of the chart were reviewed this encounter and updated as appropriate: medications, allergies, medical history  Review of Systems:  No other skin or systemic complaints except as noted in HPI or Assessment and Plan.  Objective  Well appearing patient in no apparent distress; mood and affect are within normal limits.  A full examination was performed including scalp, head, eyes, ears, nose, lips, neck, chest, axillae, abdomen, back, buttocks, bilateral upper extremities, bilateral lower extremities, hands, feet, fingers, toes, fingernails, and toenails. All findings within normal limits unless otherwise noted below.   Relevant physical exam findings are noted in the Assessment and Plan.  R lat pretibia 0.8 cm cutaneous horn     L med calf 1.2 cm thin pink plaque  R pretibia x 1, L ventral forearm x 1 (2) Erythematous thin papules/macules with gritty scale.     Assessment & Plan   LENTIGINES, SEBORRHEIC KERATOSES, HEMANGIOMAS - Benign normal skin lesions - Benign-appearing - Call for any changes  MELANOCYTIC NEVI - Tan-brown and/or pink-flesh-colored symmetric macules and papules - Benign appearing on exam today - Observation - Call clinic for new or changing moles - Recommend daily use of broad spectrum spf 30+ sunscreen to sun-exposed areas.   ACTINIC DAMAGE - Chronic condition, secondary to cumulative UV/sun exposure - diffuse scaly erythematous macules with underlying dyspigmentation - Recommend daily broad spectrum sunscreen SPF 30+ to sun-exposed areas, reapply every  2 hours as needed.  - Staying in the shade or wearing long sleeves, sun glasses (UVA+UVB protection) and wide brim hats (4-inch brim around the entire circumference of the hat) are also recommended for sun protection.  - Call for new or changing lesions.  HISTORY OF MELANOMA - L shoulder, tx in 1984 - No evidence of recurrence today - No lymphadenopathy - Recommend regular full body skin exams - Recommend daily broad spectrum sunscreen SPF 30+ to sun-exposed areas, reapply every 2 hours as needed.  - Call if any new or changing lesions are noted between office visits  HISTORY OF BASAL CELL CARCINOMA OF THE SKIN - L dorsal forearm, tx with [facility] 09/17/22 - No evidence of recurrence today - Recommend regular full body skin exams - Recommend daily broad spectrum sunscreen SPF 30+ to sun-exposed areas, reapply every 2 hours as needed.  - Call if any new or changing lesions are noted between office visits  SEBORRHEIC DERMATITIS Exam: Pink patches with greasy scale at scalp  Seborrheic Dermatitis is a chronic persistent rash characterized by pinkness and scaling most commonly of the mid face but also can occur on the scalp (dandruff), ears; mid chest, mid back and groin.  It tends to be exacerbated by stress and cooler weather.  People who have neurologic disease may experience new onset or exacerbation of existing seborrheic dermatitis.  The condition is not curable but treatable and can be controlled.  Treatment Plan: None, not bothersome to pt  Neoplasm of uncertain behavior of skin (2) R lat pretibia  Skin / nail biopsy Type of biopsy: tangential   Informed consent: discussed and consent obtained  Timeout: patient name, date of birth, surgical site, and procedure verified   Procedure prep:  Patient was prepped and draped in usual sterile fashion Prep type:  Isopropyl alcohol Anesthesia: the lesion was anesthetized in a standard fashion   Anesthetic:  1% lidocaine w/ epinephrine  1-100,000 buffered w/ 8.4% NaHCO3 Instrument used: flexible razor blade   Hemostasis achieved with: pressure, aluminum chloride and electrodesiccation   Outcome: patient tolerated procedure well   Post-procedure details: sterile dressing applied and wound care instructions given   Dressing type: bandage and petrolatum    Specimen 1 - Surgical pathology Differential Diagnosis: D48.5 wart vs SCC  Check Margins: No  L med calf  Skin / nail biopsy Type of biopsy: tangential   Informed consent: discussed and consent obtained   Timeout: patient name, date of birth, surgical site, and procedure verified   Procedure prep:  Patient was prepped and draped in usual sterile fashion Prep type:  Isopropyl alcohol Anesthesia: the lesion was anesthetized in a standard fashion   Anesthetic:  1% lidocaine w/ epinephrine 1-100,000 buffered w/ 8.4% NaHCO3 Instrument used: flexible razor blade   Hemostasis achieved with: pressure, aluminum chloride and electrodesiccation   Outcome: patient tolerated procedure well   Post-procedure details: sterile dressing applied and wound care instructions given   Dressing type: bandage and petrolatum    Specimen 2 - Surgical pathology Differential Diagnosis: D48.5 r/o BCC   Check Margins: No  AK (actinic keratosis) (2) R pretibia x 1, L ventral forearm x 1  Hypertrophic   Actinic keratoses are precancerous spots that appear secondary to cumulative UV radiation exposure/sun exposure over time. They are chronic with expected duration over 1 year. A portion of actinic keratoses will progress to squamous cell carcinoma of the skin. It is not possible to reliably predict which spots will progress to skin cancer and so treatment is recommended to prevent development of skin cancer.  Recommend daily broad spectrum sunscreen SPF 30+ to sun-exposed areas, reapply every 2 hours as needed.  Recommend staying in the shade or wearing long sleeves, sun glasses (UVA+UVB  protection) and wide brim hats (4-inch brim around the entire circumference of the hat). Call for new or changing lesions.   Destruction of lesion - R pretibia x 1, L ventral forearm x 1 Complexity: simple   Destruction method: cryotherapy   Informed consent: discussed and consent obtained   Timeout:  patient name, date of birth, surgical site, and procedure verified Lesion destroyed using liquid nitrogen: Yes   Region frozen until ice ball extended beyond lesion: Yes   Outcome: patient tolerated procedure well with no complications   Post-procedure details: wound care instructions given    SKIN CANCER SCREENING PERFORMED TODAY.  Return in about 6 months (around 09/23/2023) for TBSE.  Maylene Roes, CMA, am acting as scribe for Darden Dates, MD .   Documentation: I have reviewed the above documentation for accuracy and completeness, and I agree with the above.  Darden Dates, MD

## 2023-03-24 NOTE — Patient Instructions (Addendum)
Wound Care Instructions  Cleanse wound gently with soap and water once a day then pat dry with clean gauze. Apply a thin coat of Petrolatum (petroleum jelly, "Vaseline") over the wound (unless you have an allergy to this). We recommend that you use a new, sterile tube of Vaseline. Do not pick or remove scabs. Do not remove the yellow or white "healing tissue" from the base of the wound.  Cover the wound with fresh, clean, nonstick gauze and secure with paper tape. You may use Band-Aids in place of gauze and tape if the wound is small enough, but would recommend trimming much of the tape off as there is often too much. Sometimes Band-Aids can irritate the skin.  You should call the office for your biopsy report after 1 week if you have not already been contacted.  If you experience any problems, such as abnormal amounts of bleeding, swelling, significant bruising, significant pain, or evidence of infection, please call the office immediately.  FOR ADULT SURGERY PATIENTS: If you need something for pain relief you may take 1 extra strength Tylenol (acetaminophen) AND 2 Ibuprofen (200mg each) together every 4 hours as needed for pain. (do not take these if you are allergic to them or if you have a reason you should not take them.) Typically, you may only need pain medication for 1 to 3 days.     Due to recent changes in healthcare laws, you may see results of your pathology and/or laboratory studies on MyChart before the doctors have had a chance to review them. We understand that in some cases there may be results that are confusing or concerning to you. Please understand that not all results are received at the same time and often the doctors may need to interpret multiple results in order to provide you with the best plan of care or course of treatment. Therefore, we ask that you please give us 2 business days to thoroughly review all your results before contacting the office for clarification. Should  we see a critical lab result, you will be contacted sooner.   If You Need Anything After Your Visit  If you have any questions or concerns for your doctor, please call our main line at [phone] and press option 4 to reach your doctor's medical assistant. If no one answers, please leave a voicemail as directed and we will return your call as soon as possible. Messages left after 4 pm will be answered the following business day.   You may also send us a message via MyChart. We typically respond to MyChart messages within 1-2 business days.  For prescription refills, please ask your pharmacy to contact our office. Our fax number is [phone].  If you have an urgent issue when the clinic is closed that cannot wait until the next business day, you can page your doctor at the number below.    Please note that while we do our best to be available for urgent issues outside of office hours, we are not available 24/7.   If you have an urgent issue and are unable to reach us, you may choose to seek medical care at your doctor's office, retail clinic, urgent care center, or emergency room.  If you have a medical emergency, please immediately call 911 or go to the emergency department.  Pager Numbers  - Dr. Kowalski: [phone]  - Dr. Moye: [phone]  - Dr. Stewart: [phone]  In the event of inclement weather, please call our main line at   [phone] for an update on the status of any delays or closures.  Dermatology Medication Tips: Please keep the boxes that topical medications come in in order to help keep track of the instructions about where and how to use these. Pharmacies typically print the medication instructions only on the boxes and not directly on the medication tubes.   If your medication is too expensive, please contact our office at [phone] option 4 or send us a message through MyChart.   We are unable to tell what your co-pay for medications will be in  advance as this is different depending on your insurance coverage. However, we may be able to find a substitute medication at lower cost or fill out paperwork to get insurance to cover a needed medication.   If a prior authorization is required to get your medication covered by your insurance company, please allow us 1-2 business days to complete this process.  Drug prices often vary depending on where the prescription is filled and some pharmacies may offer cheaper prices.  The website www.goodrx.com contains coupons for medications through different pharmacies. The prices here do not account for what the cost may be with help from insurance (it may be cheaper with your insurance), but the website can give you the price if you did not use any insurance.  - You can print the associated coupon and take it with your prescription to the pharmacy.  - You may also stop by our office during regular business hours and pick up a GoodRx coupon card.  - If you need your prescription sent electronically to a different pharmacy, notify our office through [place] MyChart or by phone at [phone] option 4.     Si Usted Necesita Algo Despus de Su Visita  Tambin puede enviarnos un mensaje a travs de MyChart. Por lo general respondemos a los mensajes de MyChart en el transcurso de 1 a 2 das hbiles.  Para renovar recetas, por favor pida a su farmacia que se ponga en contacto con nuestra oficina. Nuestro nmero de fax es el [phone].  Si tiene un asunto urgente cuando la clnica est cerrada y que no puede esperar hasta el siguiente da hbil, puede llamar/localizar a su doctor(a) al nmero que aparece a continuacin.   Por favor, tenga en cuenta que aunque hacemos todo lo posible para estar disponibles para asuntos urgentes fuera del horario de oficina, no estamos disponibles las 24 horas del da, los 7 das de la semana.   Si tiene un problema urgente y no puede comunicarse con nosotros, puede  optar por buscar atencin mdica  en el consultorio de su doctor(a), en una clnica privada, en un centro de atencin urgente o en una sala de emergencias.  Si tiene una emergencia mdica, por favor llame inmediatamente al 911 o vaya a la sala de emergencias.  Nmeros de bper  - Dr. Kowalski: [phone]  - Dra. Moye: [phone]  - Dra. Stewart: [phone]  En caso de inclemencias del tiempo, por favor llame a nuestra lnea principal al [phone] para una actualizacin sobre el estado de cualquier retraso o cierre.  Consejos para la medicacin en dermatologa: Por favor, guarde las cajas en las que vienen los medicamentos de uso tpico para ayudarle a seguir las instrucciones sobre dnde y cmo usarlos. Las farmacias generalmente imprimen las instrucciones del medicamento slo en las cajas y no directamente en los tubos del medicamento.   Si su medicamento es muy caro, por favor, pngase en contacto con   nuestra oficina llamando al [phone] y presione la opcin 4 o envenos un mensaje a travs de MyChart.   No podemos decirle cul ser su copago por los medicamentos por adelantado ya que esto es diferente dependiendo de la cobertura de su seguro. Sin embargo, es posible que podamos encontrar un medicamento sustituto a menor costo o llenar un formulario para que el seguro cubra el medicamento que se considera necesario.   Si se requiere una autorizacin previa para que su compaa de seguros cubra su medicamento, por favor permtanos de 1 a 2 das hbiles para completar este proceso.  Los precios de los medicamentos varan con frecuencia dependiendo del lugar de dnde se surte la receta y alguna farmacias pueden ofrecer precios ms baratos.  El sitio web www.goodrx.com tiene cupones para medicamentos de diferentes farmacias. Los precios aqu no tienen en cuenta lo que podra costar con la ayuda del seguro (puede ser ms barato con su seguro), pero el sitio web puede darle el  precio si no utiliz ningn seguro.  - Puede imprimir el cupn correspondiente y llevarlo con su receta a la farmacia.  - Tambin puede pasar por nuestra oficina durante el horario de atencin regular y recoger una tarjeta de cupones de GoodRx.  - Si necesita que su receta se enve electrnicamente a una farmacia diferente, informe a nuestra oficina a travs de MyChart de [place] o por telfono llamando al [phone] y presione la opcin 4.  

## 2023-03-25 ENCOUNTER — Ambulatory Visit: Payer: Medicare Other | Admitting: Urology

## 2023-03-29 ENCOUNTER — Other Ambulatory Visit: Payer: Self-pay | Admitting: Physician Assistant

## 2023-03-30 ENCOUNTER — Telehealth: Payer: Self-pay

## 2023-03-30 NOTE — Telephone Encounter (Signed)
Discussed pathology results. Patient voiced understanding. EDC scheduled for 04/29/2023.

## 2023-03-30 NOTE — Telephone Encounter (Signed)
-----   Message from Sandi Mealy, MD sent at 03/29/2023  9:46 AM EDT ----- 1. Skin , right lat pretibia VERRUCA VULGARIS, IRRITATED --> This is a WART caused by the human papilloma virus. It is not dangerous but is contagious and can spread to other areas of skin or other people if it is not completely gone. No additional treatment is needed. However, if it comes back, we can freeze it in clinic with liquid nitrogen (a quick in office procedure) or you can also treat it at home with an over the counter salicylic wart treatment (slower).  2. 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 Thank you!

## 2023-04-02 ENCOUNTER — Encounter: Payer: Self-pay | Admitting: Family Medicine

## 2023-04-02 ENCOUNTER — Ambulatory Visit (INDEPENDENT_AMBULATORY_CARE_PROVIDER_SITE_OTHER): Payer: Medicare Other | Admitting: Family Medicine

## 2023-04-02 ENCOUNTER — Other Ambulatory Visit: Payer: Self-pay | Admitting: Family Medicine

## 2023-04-02 VITALS — BP 115/75 | HR 73 | Temp 98.4°F | Resp 15 | Ht 72.0 in | Wt 211.6 lb

## 2023-04-02 DIAGNOSIS — H9190 Unspecified hearing loss, unspecified ear: Secondary | ICD-10-CM

## 2023-04-02 DIAGNOSIS — Z0000 Encounter for general adult medical examination without abnormal findings: Secondary | ICD-10-CM | POA: Diagnosis not present

## 2023-04-02 DIAGNOSIS — M62830 Muscle spasm of back: Secondary | ICD-10-CM

## 2023-04-02 DIAGNOSIS — Z1211 Encounter for screening for malignant neoplasm of colon: Secondary | ICD-10-CM

## 2023-04-02 DIAGNOSIS — H9191 Unspecified hearing loss, right ear: Secondary | ICD-10-CM

## 2023-04-02 MED ORDER — METHOCARBAMOL 750 MG PO TABS: 750.0000 mg | ORAL_TABLET | Freq: Every day | ORAL | 1 refills | Status: DC

## 2023-04-02 NOTE — Progress Notes (Unsigned)
I,J'ya E Jovani Flury,acting as a scribe for Jacky Kindle, FNP.,have documented all relevant documentation on the behalf of Jacky Kindle, FNP,as directed by  Jacky Kindle, FNP while in the presence of Jacky Kindle, FNP.  Complete physical exam  Patient: [name] [name]   DOB: 01/23/1951   72 y.o. Male  MRN: [id number] Visit Date: 04/02/2023  Today's healthcare provider: Jacky Kindle, FNP  Introduced to nurse practitioner role and practice setting.  All questions answered.  Discussed provider/patient relationship and expectations.  Also, wishes to discuss referrals for AWV and hearing loss concern  Subjective    [name] [name] is a 72 y.o. male who presents today for a complete physical exam.  He reports consuming a low fat and low sodium diet. Home exercise routine includes walking 3 hrs per week. He generally feels well. He reports sleeping fairly well. He does have additional problems to discuss today.   Presents with severe back pain that radiates to his neck that started within the last few days.  He complains of hearing loss in his right ear with minimal cerumen build-up. Patient does not want to do blood work today. He is aware that he needs a colonoscopy.  He declines vaccinations at this time. He declines blood work at this time [notes upcoming blood work with cardiology team] HPI   Past Medical History:  Diagnosis Date   Acquired hypothyroidism    Aortic atherosclerosis (HCC)    Basal cell carcinoma 08/13/2022   Left dorsal forearm. Superficial and nodular. Tx with ED&C   Basal cell carcinoma 03/24/2023   Left medial calf. Superficial BCC with focal infiltration. EDC pending.   Coronary artery disease    a.) LHC 06/26/2019: EF 55-60%; LVEDP 25-30%; 80 pLAD, 40% mLAD, 40% p-mLCx, 30% mRCA; staged PCI planned. b.) LHC/staged PCI 07/05/2019: 55% mLAD, 40% p-mLCx, 30% mRCA, 85% p-mLAD, 50% D2 --> orbital atherectomy and PCI performed placing overlapping 3.0 x 8 mm (pLAD) and 3.0 x  30 mm (mLAD) Resolute Onyx DES x 2   Diverticulosis    ED (erectile dysfunction)    a.) on PDE5i (tadalifil)   Elevated LFTs    GERD (gastroesophageal reflux disease)    HLD (hyperlipidemia)    Hypertension    Long term current use of antithrombotics/antiplatelets    a.) daily DAPT therapy (ASA + clopidogrel)   Melanoma (HCC)    Left shoulder. 1984   PAF (paroxysmal atrial fibrillation) (HCC) 2004   a.) CHA2DS2-VASc = 3 (age, HTN, aortic plaque). b.) rate/rhythm maintained on oral diltiazem; on daily DAPT (ASA + clopidogrel)   Paroxysmal SVT (supraventricular tachycardia)    Pure hypercholesterolemia    Skin cancer    Left forearm. NMSC. Removed 2008 or prior   Past Surgical History:  Procedure Laterality Date   CORONARY ATHERECTOMY N/A 07/05/2019   Procedure: CORONARY ATHERECTOMY;  Surgeon: Iran Ouch, MD;  [facility];  Service: Cardiovascular;  Laterality: N/A;   CORONARY BALLOON ANGIOPLASTY N/A 07/05/2019   Procedure: CORONARY BALLOON ANGIOPLASTY;  Surgeon: Iran Ouch, MD;  Location: [facility];  Service: Cardiovascular;  Laterality: N/A;   CORONARY STENT INTERVENTION N/A 07/05/2019   Procedure: CORONARY STENT INTERVENTION;  Surgeon: Iran Ouch, MD;  Location: [facility];  Service: Cardiovascular;  Laterality: N/A;   EYE SURGERY Right 2014   Retina detachment   LEFT HEART CATH AND CORONARY ANGIOGRAPHY Left 06/26/2019   Procedure: LEFT HEART CATH AND CORONARY ANGIOGRAPHY;  Surgeon: Yvonne Kendall, MD;  Location: [facility];  Service: Cardiovascular;  Laterality: Left;   MELANOMA EXCISION  02/17/1983   NECK SURGERY  2004   SHOULDER ARTHROSCOPY WITH SUBACROMIAL DECOMPRESSION, ROTATOR CUFF REPAIR AND BICEP TENDON REPAIR Right 07/07/2022   Procedure: SUBACROMIAL DECOMPRESSION, ROTATOR CUFF REPAIR AND DISTAL CLAVICLE INCISION AND DEBRIDEMENT     distal clavicle incision, debridement;  Surgeon: Lyndle Herrlich, MD;   Location: [facility];  Service: Orthopedics;  Laterality: Right;   XI ROBOTIC ASSISTED INGUINAL HERNIA REPAIR WITH MESH Bilateral 04/18/2020   Procedure: XI ROBOTIC ASSISTED INGUINAL HERNIA REPAIR WITH MESH;  Surgeon: Leafy Ro, MD;  Location: [facility];  Service: General;  Laterality: Bilateral;   Social History   Socioeconomic History   Marital status: Married    Spouse name: Lyla Son    Number of children: 2   Years of education: Not on file   Highest education level: Not on file  Occupational History   Not on file  Tobacco Use   Smoking status: Never    Passive exposure: Never   Smokeless tobacco: Never  Vaping Use   Vaping Use: Never used  Substance and Sexual Activity   Alcohol use: Yes    Comment: Occ.    Drug use: No   Sexual activity: Yes    Birth control/protection: None  Other Topics Concern   Not on file  Social History Narrative   Not on file   Social Determinants of Health   Financial Resource Strain: Not on file  Food Insecurity: No Food Insecurity (06/26/2019)   Hunger Vital Sign    Worried About Running Out of Food in the Last Year: Never true    Ran Out of Food in the Last Year: Never true  Transportation Needs: No Transportation Needs (06/26/2019)   PRAPARE - Administrator, Civil Service (Medical): No    Lack of Transportation (Non-Medical): No  Physical Activity: Not on file  Stress: No Stress Concern Present (06/26/2019)   [employer] of Occupational Health - Occupational Stress Questionnaire    Feeling of Stress : Only a little  Social Connections: Unknown (06/26/2019)   Social Connection and Isolation Panel [NHANES]    Frequency of Communication with Friends and Family: More than three times a week    Frequency of Social Gatherings with Friends and Family: Not on file    Attends Religious Services: Not on file    Active Member of Clubs or Organizations: Not on file    Attends Banker Meetings: Not on file     Marital Status: Not on file  Intimate Partner Violence: Not At Risk (06/26/2019)   Humiliation, Afraid, Rape, and Kick questionnaire    Fear of Current or Ex-Partner: No    Emotionally Abused: No    Physically Abused: No    Sexually Abused: No   Family Status  Relation Name Status   Mother  Deceased   Father  Deceased   Sister  Alive   Sister  Alive   Brother  Alive   Family History  Problem Relation Age of Onset   Atrial fibrillation Mother    Stroke Father    Heart disease Father    Allergies  Allergen Reactions   Hydrocodone-Acetaminophen Nausea And Vomiting and Other (See Comments)   Vicodin [Hydrocodone-Acetaminophen] Nausea And Vomiting    Patient Care Team: Jacky Kindle, FNP as PCP - General (Family Medicine) Iran Ouch, MD as PCP - Cardiology (  Cardiology)   Medications: Outpatient Medications Prior to Visit  Medication Sig   acetaminophen (TYLENOL) 500 MG tablet Take 500 mg by mouth every 6 (six) hours as needed.   aspirin EC 81 MG tablet Take 81 mg by mouth daily.    cetirizine (ZYRTEC) 10 MG tablet Take 10 mg by mouth at bedtime.    clopidogrel (PLAVIX) 75 MG tablet Take 1 tablet by mouth once daily   diltiazem (CARDIZEM CD) 120 MG 24 hr capsule Take 1 capsule (120 mg total) by mouth in the morning and at bedtime.   ezetimibe (ZETIA) 10 MG tablet TAKE 1 TABLET BY MOUTH ONCE DAILY IN THE EVENING   famotidine (PEPCID) 20 MG tablet Take 20 mg by mouth daily.   levothyroxine (SYNTHROID) 25 MCG tablet TAKE 1 TABLET BY MOUTH BEFORE BREAKFAST   nitroGLYCERIN (NITROSTAT) 0.4 MG SL tablet Place 1 tablet (0.4 mg total) under the tongue every 5 (five) minutes as needed for chest pain.   rosuvastatin (CRESTOR) 40 MG tablet Take 1 tablet by mouth once daily   sildenafil (VIAGRA) 100 MG tablet Take 1 tablet (100 mg total) by mouth daily as needed for erectile dysfunction. Take two hours prior to intercourse on an empty stomach   VITAMIN D, CHOLECALCIFEROL, PO Take by  mouth daily.   [DISCONTINUED] AMBULATORY NON FORMULARY MEDICATION Trimix (30/1/10)-(Pap/Phent/PGE)  Test Dose  1ml vial   Qty #3 Refills 0  [facility] [phone] Fax [phone] (Patient not taking: Reported on 03/24/2023)   No facility-administered medications prior to visit.    Review of Systems   Objective    BP 115/75 (BP Location: Right Arm, Patient Position: Sitting, Cuff Size: Large)   Pulse 73   Temp 98.4 F (36.9 C) (Oral)   Resp 15   Ht 6' (1.829 m)   Wt 211 lb 9.6 oz (96 kg)   SpO2 97%   BMI 28.70 kg/m   Physical Exam Vitals and nursing note reviewed.  Constitutional:      General: He is awake. He is not in acute distress.    Appearance: Normal appearance. He is well-developed, well-groomed and overweight. He is not ill-appearing, toxic-appearing or diaphoretic.  HENT:     Head: Normocephalic and atraumatic.     Jaw: There is normal jaw occlusion. No trismus, tenderness, swelling or pain on movement.     Salivary Glands: Right salivary gland is not diffusely enlarged or tender. Left salivary gland is not diffusely enlarged or tender.     Right Ear: Hearing, tympanic membrane, ear canal and external ear normal. There is no impacted cerumen.     Left Ear: Hearing, tympanic membrane, ear canal and external ear normal. There is no impacted cerumen.     Nose: Nose normal. No congestion or rhinorrhea.     Right Turbinates: Not enlarged, swollen or pale.     Left Turbinates: Not enlarged, swollen or pale.     Right Sinus: No maxillary sinus tenderness or frontal sinus tenderness.     Left Sinus: No maxillary sinus tenderness or frontal sinus tenderness.     Mouth/Throat:     Lips: Pink.     Mouth: Mucous membranes are moist. No injury, lacerations, oral lesions or angioedema.     Pharynx: Oropharynx is clear. Uvula midline. No pharyngeal swelling, oropharyngeal exudate or posterior oropharyngeal erythema.     Tonsils: No tonsillar exudate or tonsillar  abscesses.  Eyes:     General: Lids are normal. Vision grossly intact. Gaze aligned  appropriately.        Right eye: No discharge.        Left eye: No discharge.     Extraocular Movements: Extraocular movements intact.     Conjunctiva/sclera: Conjunctivae normal.     Pupils: Pupils are equal, round, and reactive to light.  Neck:     Thyroid: No thyroid mass, thyromegaly or thyroid tenderness.     Vascular: No carotid bruit.     Trachea: Trachea normal. No tracheal tenderness.  Cardiovascular:     Rate and Rhythm: Normal rate and regular rhythm.     Pulses: Normal pulses.          Carotid pulses are 2+ on the right side and 2+ on the left side.      Radial pulses are 2+ on the right side and 2+ on the left side.       Femoral pulses are 2+ on the right side and 2+ on the left side.      Popliteal pulses are 2+ on the right side and 2+ on the left side.       Dorsalis pedis pulses are 2+ on the right side and 2+ on the left side.       Posterior tibial pulses are 2+ on the right side and 2+ on the left side.     Heart sounds: Normal heart sounds, S1 normal and S2 normal. No murmur heard.    No friction rub. No gallop.  Pulmonary:     Effort: Pulmonary effort is normal. No respiratory distress.     Breath sounds: Normal breath sounds and air entry. No stridor. No wheezing, rhonchi or rales.  Chest:     Chest wall: No tenderness.  Abdominal:     General: Abdomen is flat. Bowel sounds are normal. There is no distension.     Palpations: Abdomen is soft. There is no mass.     Tenderness: There is no abdominal tenderness. There is no guarding or rebound.     Hernia: No hernia is present.  Genitourinary:    Comments: Exam deferred; denies complaints Musculoskeletal:        General: No swelling, tenderness, deformity or signs of injury. Normal range of motion.     Cervical back: Normal range of motion and neck supple. No rigidity or tenderness.     Right lower leg: No edema.     Left  lower leg: No edema.  Lymphadenopathy:     Cervical: No cervical adenopathy.     Right cervical: No superficial, deep or posterior cervical adenopathy.    Left cervical: No superficial, deep or posterior cervical adenopathy.  Skin:    General: Skin is warm and dry.     Capillary Refill: Capillary refill takes less than 2 seconds.     Coloration: Skin is not jaundiced or pale.     Findings: No bruising, erythema, lesion or rash.  Neurological:     General: No focal deficit present.     Mental Status: He is alert and oriented to person, place, and time. Mental status is at baseline.     GCS: GCS eye subscore is 4. GCS verbal subscore is 5. GCS motor subscore is 6.     Sensory: Sensation is intact. No sensory deficit.     Motor: Motor function is intact. No weakness.     Coordination: Coordination is intact.     Gait: Gait is intact.  Psychiatric:        Attention and  Perception: Attention and perception normal.        Mood and Affect: Mood and affect normal.        Speech: Speech normal.        Behavior: Behavior normal. Behavior is cooperative.        Thought Content: Thought content normal.        Cognition and Memory: Cognition normal.        Judgment: Judgment normal.     Last depression screening scores    04/02/2023   11:16 AM 11/12/2021   10:36 AM 01/31/2020   11:04 AM  PHQ 2/9 Scores  PHQ - 2 Score 0 0 0  PHQ- 9 Score 0 0    Last fall risk screening    04/02/2023   11:15 AM  Fall Risk   Falls in the past year? 0  Number falls in past yr: 0  Injury with Fall? 0  Risk for fall due to : No Fall Risks   Last Audit-C alcohol use screening    04/02/2023   11:16 AM  Alcohol Use Disorder Test (AUDIT)  1. How often do you have a drink containing alcohol? 2  2. How many drinks containing alcohol do you have on a typical day when you are drinking? 0  3. How often do you have six or more drinks on one occasion? 0  AUDIT-C Score 2   A score of 3 or more in women, and 4  or more in men indicates increased risk for alcohol abuse, EXCEPT if all of the points are from question 1   No results found for any visits on 04/02/23.  Assessment & Plan    Routine Health Maintenance and Physical Exam  Exercise Activities and Dietary recommendations  Goals   None     Immunization History  Administered Date(s) Administered   Moderna Sars-Covid-2 Vaccination 02/13/2020, 03/12/2020, 04/29/2021    Health Maintenance  Topic Date Due   Hepatitis C Screening  Never done   DTaP/Tdap/Td (1 - Tdap) Never done   Zoster Vaccines- Shingrix (1 of 2) Never done   Pneumonia Vaccine 46+ Years old (1 of 1 - PCV) Never done   COLONOSCOPY (Pts 45-46yrs Insurance coverage will need to be confirmed)  01/08/2018   COVID-19 Vaccine (4 - 2023-24 season) 08/07/2022   INFLUENZA VACCINE  07/08/2023   Medicare Annual Wellness (AWV)  04/03/2024   HPV VACCINES  Aged Out    Discussed health benefits of physical activity, and encouraged him to engage in regular exercise appropriate for his age and condition.  Problem List Items Addressed This Visit       Nervous and Auditory   Hearing loss    Chronic, worsening Request for referral      Relevant Orders   Ambulatory referral to ENT     Other   Annual physical exam    Things to do to keep yourself healthy  - Exercise at least 30-45 minutes a day, 3-4 days a week.  - Eat a low-fat diet with lots of fruits and vegetables, up to 7-9 servings per day.  - Seatbelts can save your life. Wear them always.  - Smoke detectors on every level of your home, check batteries every year.  - Eye Doctor - have an eye exam every 1-2 years  - Safe sex - if you may be exposed to STDs, use a condom.  - Alcohol -  If you drink, do it moderately, less than 2 drinks per day.  -  Health Care Power of Attorney. Choose someone to speak for you if you are not able.  - Depression is common in our stressful world.If you're feeling down or losing interest in  things you normally enjoy, please come in for a visit.  - Violence - If anyone is threatening or hurting you, please call immediately.       Encounter for subsequent annual wellness visit (AWV) in Medicare patient - Primary   Screening for colon cancer    Due for colon cancer screening; referral placed      Relevant Orders   Ambulatory referral to Gastroenterology   No follow-ups on file.    Leilani Merl, FNP, have reviewed all documentation for this visit. The documentation on 04/04/23 for the exam, diagnosis, procedures, and orders are all accurate and complete.  Jacky Kindle, FNP  [facility] [phone] (phone) [phone] (fax)  [facility]

## 2023-04-04 DIAGNOSIS — H9190 Unspecified hearing loss, unspecified ear: Secondary | ICD-10-CM | POA: Insufficient documentation

## 2023-04-04 DIAGNOSIS — Z0000 Encounter for general adult medical examination without abnormal findings: Secondary | ICD-10-CM | POA: Insufficient documentation

## 2023-04-04 DIAGNOSIS — Z1211 Encounter for screening for malignant neoplasm of colon: Secondary | ICD-10-CM | POA: Insufficient documentation

## 2023-04-04 NOTE — Assessment & Plan Note (Signed)
Chronic, worsening Request for referral

## 2023-04-04 NOTE — Assessment & Plan Note (Signed)

## 2023-04-04 NOTE — Assessment & Plan Note (Signed)
Due for colon cancer screening; referral placed  

## 2023-04-05 DIAGNOSIS — H35341 Macular cyst, hole, or pseudohole, right eye: Secondary | ICD-10-CM | POA: Diagnosis not present

## 2023-04-05 DIAGNOSIS — H2513 Age-related nuclear cataract, bilateral: Secondary | ICD-10-CM | POA: Diagnosis not present

## 2023-04-05 DIAGNOSIS — H43812 Vitreous degeneration, left eye: Secondary | ICD-10-CM | POA: Diagnosis not present

## 2023-04-05 DIAGNOSIS — Z8669 Personal history of other diseases of the nervous system and sense organs: Secondary | ICD-10-CM | POA: Diagnosis not present

## 2023-04-05 DIAGNOSIS — Q142 Congenital malformation of optic disc: Secondary | ICD-10-CM | POA: Diagnosis not present

## 2023-04-11 ENCOUNTER — Other Ambulatory Visit: Payer: Self-pay | Admitting: Cardiovascular Disease

## 2023-04-11 DIAGNOSIS — R079 Chest pain, unspecified: Secondary | ICD-10-CM

## 2023-04-16 ENCOUNTER — Other Ambulatory Visit: Payer: Self-pay | Admitting: Physician Assistant

## 2023-04-16 ENCOUNTER — Other Ambulatory Visit: Payer: Self-pay | Admitting: Family Medicine

## 2023-04-16 DIAGNOSIS — E039 Hypothyroidism, unspecified: Secondary | ICD-10-CM

## 2023-04-16 NOTE — Telephone Encounter (Signed)
Requested Prescriptions  Pending Prescriptions Disp Refills   levothyroxine (SYNTHROID) 25 MCG tablet [Pharmacy Med Name: Levothyroxine Sodium 25 MCG Oral Tablet] 90 tablet 0    Sig: TAKE 1 TABLET BY MOUTH BEFORE BREAKFAST     Endocrinology:  Hypothyroid Agents Failed - 04/16/2023  7:18 AM      Failed - TSH in normal range and within 360 days    TSH  Date Value Ref Range Status  03/25/2022 2.341 0.350 - 4.500 uIU/mL Final    Comment:    Performed by a 3rd Generation assay with a functional sensitivity of <=0.01 uIU/mL. Performed at [facility], [address] [place], [place] [address]   08/28/2020 3.580 0.450 - 4.500 uIU/mL Final         Passed - Valid encounter within last 12 months    Recent Outpatient Visits           2 weeks ago Encounter for subsequent annual wellness visit (AWV) in Medicare patient   [facility] Merita Norton T, FNP   9 months ago Herpes zoster without complication   [facility] Caro Laroche, DO   10 months ago COVID-19   [facility] Merita Norton T, FNP   1 year ago Atherosclerotic heart disease of native coronary artery with other forms of angina pectoris [facility])   [name] Jacky Kindle, FNP   2 years ago Viral URI   [facility] Bosie Clos, MD       Future Appointments             In 1 week [facility], [place], MD [facility]   In 2 weeks Dunn, Raymon Mutton, PA-C [facility] [place]   In 3 weeks McGowan, Elana Alm [facility]

## 2023-04-19 DIAGNOSIS — H6122 Impacted cerumen, left ear: Secondary | ICD-10-CM | POA: Diagnosis not present

## 2023-04-19 DIAGNOSIS — H903 Sensorineural hearing loss, bilateral: Secondary | ICD-10-CM | POA: Diagnosis not present

## 2023-04-23 ENCOUNTER — Other Ambulatory Visit
Admission: RE | Admit: 2023-04-23 | Discharge: 2023-04-23 | Disposition: A | Discharge status: Home / Self Care | Payer: Medicare Other | Attending: Physician Assistant | Admitting: Physician Assistant

## 2023-04-23 DIAGNOSIS — E7849 Other hyperlipidemia: Secondary | ICD-10-CM | POA: Diagnosis not present

## 2023-04-23 DIAGNOSIS — I2510 Atherosclerotic heart disease of native coronary artery without angina pectoris: Secondary | ICD-10-CM | POA: Diagnosis not present

## 2023-04-23 DIAGNOSIS — E785 Hyperlipidemia, unspecified: Secondary | ICD-10-CM

## 2023-04-23 DIAGNOSIS — I480 Paroxysmal atrial fibrillation: Secondary | ICD-10-CM

## 2023-04-23 DIAGNOSIS — I471 Supraventricular tachycardia, unspecified: Secondary | ICD-10-CM | POA: Insufficient documentation

## 2023-04-23 LAB — COMPREHENSIVE METABOLIC PANEL
ALT: 36 U/L (ref 0–44)
AST: 37 U/L (ref 15–41)
Albumin: 4.4 g/dL (ref 3.5–5.0)
Alkaline Phosphatase: 52 U/L (ref 38–126)
Anion gap: 5 (ref 5–15)
BUN: 17 mg/dL (ref 8–23)
CO2: 27 mmol/L (ref 22–32)
Calcium: 9.9 mg/dL (ref 8.9–10.3)
Chloride: 108 mmol/L (ref 98–111)
Creatinine, Ser: 0.94 mg/dL (ref 0.61–1.24)
GFR, Estimated: >60 mL/min (ref >60)
Glucose, Bld: 104 mg/dL — ABNORMAL HIGH (ref 70–99)
Potassium: 4.2 mmol/L (ref 3.5–5.1)
Sodium: 140 mmol/L (ref 135–145)
Total Bilirubin: 0.7 mg/dL (ref 0.3–1.2)
Total Protein: 6.9 g/dL (ref 6.5–8.1)

## 2023-04-23 LAB — LIPID PANEL
Cholesterol: 132 mg/dL (ref 0–200)
HDL: 46 mg/dL (ref >40)
LDL Cholesterol: 69 mg/dL (ref 0–99)
Total CHOL/HDL Ratio: 2.9 RATIO
Triglycerides: 83 mg/dL (ref <150)
VLDL: 17 mg/dL (ref 0–40)

## 2023-04-28 ENCOUNTER — Ambulatory Visit: Payer: Medicare Other | Admitting: Cardiovascular Disease

## 2023-04-29 ENCOUNTER — Encounter: Payer: Self-pay | Admitting: Dermatology

## 2023-04-29 ENCOUNTER — Ambulatory Visit: Payer: Medicare Other | Admitting: Dermatology

## 2023-04-29 DIAGNOSIS — L821 Other seborrheic keratosis: Secondary | ICD-10-CM | POA: Diagnosis not present

## 2023-04-29 DIAGNOSIS — L578 Other skin changes due to chronic exposure to nonionizing radiation: Secondary | ICD-10-CM

## 2023-04-29 DIAGNOSIS — L814 Other melanin hyperpigmentation: Secondary | ICD-10-CM

## 2023-04-29 DIAGNOSIS — C44719 Basal cell carcinoma of skin of left lower limb, including hip: Secondary | ICD-10-CM

## 2023-04-29 NOTE — Patient Instructions (Addendum)
Wound Care Instructions  Cleanse wound gently with soap and water once a day then pat dry with clean gauze. Apply a thin coat of Petrolatum (petroleum jelly, "Vaseline") over the wound (unless you have an allergy to this). We recommend that you use a new, sterile tube of Vaseline. Do not pick or remove scabs. Do not remove the yellow or white "healing tissue" from the base of the wound.  Cover the wound with fresh, clean, nonstick gauze and secure with paper tape. You may use Band-Aids in place of gauze and tape if the wound is small enough, but would recommend trimming much of the tape off as there is often too much. Sometimes Band-Aids can irritate the skin.  You should call the office for your biopsy report after 1 week if you have not already been contacted.  If you experience any problems, such as abnormal amounts of bleeding, swelling, significant bruising, significant pain, or evidence of infection, please call the office immediately.  FOR ADULT SURGERY PATIENTS: If you need something for pain relief you may take 1 extra strength Tylenol (acetaminophen) AND 2 Ibuprofen (200mg each) together every 4 hours as needed for pain. (do not take these if you are allergic to them or if you have a reason you should not take them.) Typically, you may only need pain medication for 1 to 3 days.   Recommend taking Heliocare sun protection supplement daily in sunny weather for additional sun protection. For maximum protection on the sunniest days, you can take up to 2 capsules of regular Heliocare OR take 1 capsule of Heliocare Ultra. For prolonged exposure (such as a full day in the sun), you can repeat your dose of the supplement 4 hours after your first dose. Heliocare can be purchased at [facility], at some [facility] or at [email or web address].com.    Due to recent changes in healthcare laws, you may see results of your pathology and/or laboratory studies on MyChart before the doctors have had a chance  to review them. We understand that in some cases there may be results that are confusing or concerning to you. Please understand that not all results are received at the same time and often the doctors may need to interpret multiple results in order to provide you with the best plan of care or course of treatment. Therefore, we ask that you please give us 2 business days to thoroughly review all your results before contacting the office for clarification. Should we see a critical lab result, you will be contacted sooner.   If You Need Anything After Your Visit  If you have any questions or concerns for your doctor, please call our main line at [phone] and press option 4 to reach your doctor's medical assistant. If no one answers, please leave a voicemail as directed and we will return your call as soon as possible. Messages left after 4 pm will be answered the following business day.   You may also send us a message via MyChart. We typically respond to MyChart messages within 1-2 business days.  For prescription refills, please ask your pharmacy to contact our office. Our fax number is [phone].  If you have an urgent issue when the clinic is closed that cannot wait until the next business day, you can page your doctor at the number below.    Please note that while we do our best to be available for urgent issues outside of office hours, we are not available 24/7.   If   you have an urgent issue and are unable to reach us, you may choose to seek medical care at your doctor's office, retail clinic, urgent care center, or emergency room.  If you have a medical emergency, please immediately call 911 or go to the emergency department.  Pager Numbers  - Dr. Kowalski: [phone]  - Dr. Moye: [phone]  - Dr. Stewart: [phone]  In the event of inclement weather, please call our main line at [phone] for an update on the status of any delays or closures.  Dermatology  Medication Tips: Please keep the boxes that topical medications come in in order to help keep track of the instructions about where and how to use these. Pharmacies typically print the medication instructions only on the boxes and not directly on the medication tubes.   If your medication is too expensive, please contact our office at [phone] option 4 or send us a message through MyChart.   We are unable to tell what your co-pay for medications will be in advance as this is different depending on your insurance coverage. However, we may be able to find a substitute medication at lower cost or fill out paperwork to get insurance to cover a needed medication.   If a prior authorization is required to get your medication covered by your insurance company, please allow us 1-2 business days to complete this process.  Drug prices often vary depending on where the prescription is filled and some pharmacies may offer cheaper prices.  The website www.goodrx.com contains coupons for medications through different pharmacies. The prices here do not account for what the cost may be with help from insurance (it may be cheaper with your insurance), but the website can give you the price if you did not use any insurance.  - You can print the associated coupon and take it with your prescription to the pharmacy.  - You may also stop by our office during regular business hours and pick up a GoodRx coupon card.  - If you need your prescription sent electronically to a different pharmacy, notify our office through [facility] MyChart or by phone at [phone] option 4.    

## 2023-04-29 NOTE — Progress Notes (Signed)
   Follow-Up Visit   Subjective  [name] [name] is a 72 y.o. male who presents for the following: EDC to bx proven BCC at left medial calf.   The following portions of the chart were reviewed this encounter and updated as appropriate: medications, allergies, medical history  Review of Systems:  No other skin or systemic complaints except as noted in HPI or Assessment and Plan.  Objective  Well appearing patient in no apparent distress; mood and affect are within normal limits.   A focused examination was performed of the following areas: Left leg  Relevant exam findings are noted in the Assessment and Plan.  left medial calf Pink bx site    Assessment & Plan     Basal cell carcinoma (BCC) of skin of left lower extremity including hip left medial calf  Destruction of lesion  Destruction method: electrodesiccation and curettage   Informed consent: discussed and consent obtained   Timeout:  patient name, date of birth, surgical site, and procedure verified Anesthesia: the lesion was anesthetized in a standard fashion   Anesthetic:  1% lidocaine w/ epinephrine 1-100,000 buffered w/ 8.4% NaHCO3 Curettage performed in three different directions: Yes   Electrodesiccation performed over the curetted area: Yes   Curettage cycles:  3 Final wound size (cm):  1.3 Hemostasis achieved with:  electrodesiccation Outcome: patient tolerated procedure well with no complications   Post-procedure details: sterile dressing applied and wound care instructions given   Dressing type: petrolatum     ACTINIC DAMAGE - chronic, secondary to cumulative UV radiation exposure/sun exposure over time - diffuse scaly erythematous macules with underlying dyspigmentation - Recommend daily broad spectrum sunscreen SPF 30+ to sun-exposed areas, reapply every 2 hours as needed.  - Recommend staying in the shade or wearing long sleeves, sun glasses (UVA+UVB protection) and wide brim hats (4-inch brim  around the entire circumference of the hat). - Call for new or changing lesions. - Recommend taking Heliocare sun protection supplement daily in sunny weather for additional sun protection. For maximum protection on the sunniest days, you can take up to 2 capsules of regular Heliocare OR take 1 capsule of Heliocare Ultra.   LENTIGINES Exam: scattered tan macules Due to sun exposure Treatment Plan: Benign-appearing, observe. Recommend daily broad spectrum sunscreen SPF 30+ to sun-exposed areas, reapply every 2 hours as needed.  Call for any changes  SEBORRHEIC KERATOSIS - Stuck-on, waxy, tan-brown papules and/or plaques  - Benign-appearing - Discussed benign etiology and prognosis. - Observe - Call for any changes    Return for as scheduled.  Anise Salvo, RMA, am acting as scribe for Darden Dates, MD .   Documentation: I have reviewed the above documentation for accuracy and completeness, and I agree with the above.  Darden Dates, MD

## 2023-04-29 NOTE — Progress Notes (Signed)
error 

## 2023-04-30 ENCOUNTER — Ambulatory Visit: Payer: Medicare Other | Attending: Cardiovascular Disease | Admitting: Physician Assistant

## 2023-04-30 ENCOUNTER — Other Ambulatory Visit
Admission: RE | Admit: 2023-04-30 | Discharge: 2023-04-30 | Disposition: A | Discharge status: Home / Self Care | Payer: Medicare Other | Attending: Physician Assistant | Admitting: Physician Assistant

## 2023-04-30 ENCOUNTER — Encounter: Payer: Self-pay | Admitting: Physician Assistant

## 2023-04-30 ENCOUNTER — Telehealth: Payer: Self-pay | Admitting: Cardiovascular Disease

## 2023-04-30 VITALS — BP 132/62 | HR 66 | Ht 72.0 in | Wt 212.0 lb

## 2023-04-30 DIAGNOSIS — I5189 Other ill-defined heart diseases: Secondary | ICD-10-CM

## 2023-04-30 DIAGNOSIS — I2089 Other forms of angina pectoris: Secondary | ICD-10-CM

## 2023-04-30 DIAGNOSIS — E785 Hyperlipidemia, unspecified: Secondary | ICD-10-CM

## 2023-04-30 DIAGNOSIS — I471 Supraventricular tachycardia, unspecified: Secondary | ICD-10-CM | POA: Diagnosis not present

## 2023-04-30 DIAGNOSIS — I480 Paroxysmal atrial fibrillation: Secondary | ICD-10-CM | POA: Diagnosis not present

## 2023-04-30 DIAGNOSIS — I25118 Atherosclerotic heart disease of native coronary artery with other forms of angina pectoris: Secondary | ICD-10-CM | POA: Diagnosis not present

## 2023-04-30 DIAGNOSIS — E7849 Other hyperlipidemia: Secondary | ICD-10-CM

## 2023-04-30 DIAGNOSIS — R0609 Other forms of dyspnea: Secondary | ICD-10-CM | POA: Diagnosis not present

## 2023-04-30 LAB — CBC
HCT: 42.7 % (ref 39.0–52.0)
Hemoglobin: 14.4 g/dL (ref 13.0–17.0)
MCH: 29.5 pg (ref 26.0–34.0)
MCHC: 33.7 g/dL (ref 30.0–36.0)
MCV: 87.5 fL (ref 80.0–100.0)
Platelets: 170 10*3/uL (ref 150–400)
RBC: 4.88 MIL/uL (ref 4.22–5.81)
RDW: 13.8 % (ref 11.5–15.5)
WBC: 5.6 10*3/uL (ref 4.0–10.5)
nRBC: 0 % (ref 0.0–0.2)

## 2023-04-30 LAB — BASIC METABOLIC PANEL
Anion gap: 6 (ref 5–15)
BUN: 23 mg/dL (ref 8–23)
CO2: 25 mmol/L (ref 22–32)
Calcium: 10.2 mg/dL (ref 8.9–10.3)
Chloride: 108 mmol/L (ref 98–111)
Creatinine, Ser: 0.79 mg/dL (ref 0.61–1.24)
GFR, Estimated: >60 mL/min (ref >60)
Glucose, Bld: 113 mg/dL — ABNORMAL HIGH (ref 70–99)
Potassium: 3.7 mmol/L (ref 3.5–5.1)
Sodium: 139 mmol/L (ref 135–145)

## 2023-04-30 NOTE — H&P (View-Only) (Signed)
 Cardiology Office Note    Date:  04/30/2023   ID:  [name] [name], DOB 09/13/1951, MRN [id number]  PCP:  Payne, Elise T, FNP  Cardiologist:  Muhammad Arida, MD  Electrophysiologist:  None   Chief Complaint: Follow-up  History of Present Illness:   [name] [name] is a 72 y.o. male with history of CAD s/p PCI/DES to the LAD in 06/2019, PAF diagnosed in 2004 not on OAC in the setting of CHADS2VASc of 1, paroxysmal SVT, mild aortic valve regurgitation, diastolic dysfunction, hypothyroidism on Synthroid, severe possibly familial hyperlipidemia, elevated liver function, and strong family history of premature coronary artery disease on his father's side who presents for follow up of CAD and palpitations.   He was previously managed by Dr. Macomber in [place], [place].  He was initially diagnosed with A. fib in 2004 and was managed with metoprolol though this was discontinued secondary to nightmares.  Since then, he has been managed with diltiazem.  Prior stress echo in 2013 was unremarkable.  Holter monitor in 12/2014 showed the predominant rhythm was sinus with an average heart rate of 71 bpm, minimum heart rate 45 bpm, maximum heart rate 118 bpm, rare isolated PVCs, frequent isolated PACs totaling 4587, 6 atrial couplets, 3 runs of SVT with the longest lasting 7 beats with a maximum rate of 141 bpm, no significant pauses.  No evidence of A. Fib.  He underwent treadmill stress test on 03/16/2018 that showed no evidence of ischemia with good exercise capacity with an exercise duration of 7 minutes and 48 seconds.  He was evaluated in 04/2019 for palpitations and chest pain.  Echo showed an EF of 55-60%, mild concentric LVH, normal diastolic function, normal RVSF, grossly normal mitral and tricuspid valves, mild AI.  Subsequent Zio in 05/2019 showed NSR with an average heart rate of 73 bpm with 27 episodes of short SVT with the longest episode lasting 13 beats.  He underwent coronary CTA in 06/2019, which showed left main  FFR 0.98, ostial LAD FFR 0.98, proximal LAD FFR 0.50, proximal LCx FFR 0.95, mid LCx FFR 0.78, OM1 FFR 0.77, proximal RCA FFR 0.98, mid RCA FFR 0.82, PDA FFR 0.79. Given this, he underwent diagnostic LHC on 06/26/2019 that showed severe single-vessel CAD with 80-90% proximal LAD stenosis with heavy calcification as well as mild to moderate nonobstructive CAD involving the mid LAD, mid LCx, and mid RCA with elevated LVEDP and normal LV contraction consistent with diastolic dysfunction. Given the heavy calcification of the LAD, it was recommended he undergo staged PCI with atherectomy. He underwent successful complex orbital atherectomy and 2 overlapping DES to the proximal and mid LAD extending into the ostium with balloon angioplasty of the ostial D2. Procedure was complicated by a non-flow limiting dissection in the ostial diagonal with normal flow that did not require treatment.    He was seen in the office in 07/2021 and was doing well from a cardiac perspective without symptoms of angina or decompensation.  He noted an improvement in his overall palpitation burden following prior titration of Cardizem CD to 240 mg daily.     He was seen on 03/19/2022 noting some palpitations and chest/leg heaviness and fatigue that felt similar to what he experienced leading up to his PCI.  At the time of his visit, his palpitation burden had improved.  Subsequent Lexiscan MPI on 03/25/2022 showed no evidence of ischemia with an EF of 56% and was overall low risk.  LAD and RCA calcifications were noted on CT   imaging.    He was seen in the office in 04/2022 and was without symptoms of angina or decompensation.  He also noted less palpitations, though did continue to have some in the p.m.  Given this, Cardizem CD was changed to 120 mg twice daily.  He was last seen in the office in 10/2022 and was without symptoms of angina or cardiac decompensation.  Palpitation burden had improved following transitioning off Cardizem to 120  mg twice daily.  However, with this he did note an increase in lower extremity swelling that was progressive throughout the day and improved when lying supine overnight.  He was started on low-dose furosemide 20 mg daily with recommendation to elevate legs and wear compression socks as well.  He comes in today noting an increase in generalized malaise and fatigue that feel the same when compared to how he fell leading up to his PCI in 2020.  This time, it appears as a fatigue and sluggishness are more pronounced.  He indicates he has even cut back on work, though remains quite fatigued.  He has been without frank chest pain or dyspnea.  He never had chest pain or dyspnea leading up to PCI in 2020.  No dizziness, presyncope, or syncope.  Adherent and tolerating cardiac medications without issues.  He does notice some intermittent blood on toilet tissue at times.  He does feel like he would be fatigued and short of breath if he had to climb a fair amount of stairs.   Labs independently reviewed: 04/2023 - potassium 4.2, BUN 17, serum creatinine 0.94, albumin 4.4, AST/ALT normal, TC 132, TG 83, HDL 46, LDL 69 05/2022 - Hgb 14.5, PLT 168 03/2022 - TSH normal,  Past Medical History:  Diagnosis Date   Acquired hypothyroidism    Aortic atherosclerosis (HCC)    Basal cell carcinoma 08/13/2022   Left dorsal forearm. Superficial and nodular. Tx with ED&C 09/17/22   Basal cell carcinoma 03/24/2023   Left medial calf. Superficial BCC with focal infiltration. EDC 04/29/23   Coronary artery disease    a.) LHC 06/26/2019: EF 55-60%; LVEDP 25-30%; 80 pLAD, 40% mLAD, 40% p-mLCx, 30% mRCA; staged PCI planned. b.) LHC/staged PCI 07/05/2019: 55% mLAD, 40% p-mLCx, 30% mRCA, 85% p-mLAD, 50% D2 --> orbital atherectomy and PCI performed placing overlapping 3.0 x 8 mm (pLAD) and 3.0 x 30 mm (mLAD) Resolute Onyx DES x 2   Diverticulosis    ED (erectile dysfunction)    a.) on PDE5i (tadalifil)   Elevated LFTs    GERD  (gastroesophageal reflux disease)    HLD (hyperlipidemia)    Hypertension    Long term current use of antithrombotics/antiplatelets    a.) daily DAPT therapy (ASA + clopidogrel)   Melanoma (HCC)    Left shoulder. 1984   PAF (paroxysmal atrial fibrillation) (HCC) 2004   a.) CHA2DS2-VASc = 3 (age, HTN, aortic plaque). b.) rate/rhythm maintained on oral diltiazem; on daily DAPT (ASA + clopidogrel)   Paroxysmal SVT (supraventricular tachycardia)    Pure hypercholesterolemia    Skin cancer    Left forearm. NMSC. Removed 2008 or prior    Past Surgical History:  Procedure Laterality Date   CORONARY ATHERECTOMY N/A 07/05/2019   Procedure: CORONARY ATHERECTOMY;  Surgeon: Arida, Muhammad A, MD;  Location: [facility];  Service: Cardiovascular;  Laterality: N/A;   CORONARY BALLOON ANGIOPLASTY N/A 07/05/2019   Procedure: CORONARY BALLOON ANGIOPLASTY;  Surgeon: Arida, Muhammad A, MD;  Location: [facility];  Service: Cardiovascular;    Laterality: N/A;   CORONARY STENT INTERVENTION N/A 07/05/2019   Procedure: CORONARY STENT INTERVENTION;  Surgeon: Arida, Muhammad A, MD;  Location: [facility];  Service: Cardiovascular;  Laterality: N/A;   EYE SURGERY Right 2014   Retina detachment   LEFT HEART CATH AND CORONARY ANGIOGRAPHY Left 06/26/2019   Procedure: LEFT HEART CATH AND CORONARY ANGIOGRAPHY;  Surgeon: End, Christopher, MD;  Location: [facility];  Service: Cardiovascular;  Laterality: Left;   MELANOMA EXCISION  02/17/1983   NECK SURGERY  2004   SHOULDER ARTHROSCOPY WITH SUBACROMIAL DECOMPRESSION, ROTATOR CUFF REPAIR AND BICEP TENDON REPAIR Right 07/07/2022   Procedure: SUBACROMIAL DECOMPRESSION, ROTATOR CUFF REPAIR AND DISTAL CLAVICLE INCISION AND DEBRIDEMENT     distal clavicle incision, debridement;  Surgeon: Bowers, James R, MD;  Location: [facility];  Service: Orthopedics;  Laterality: Right;   XI ROBOTIC ASSISTED INGUINAL HERNIA REPAIR WITH MESH Bilateral 04/18/2020    Procedure: XI ROBOTIC ASSISTED INGUINAL HERNIA REPAIR WITH MESH;  Surgeon: Pabon, Diego F, MD;  Location: [facility];  Service: General;  Laterality: Bilateral;    Current Medications: Current Meds  Medication Sig   acetaminophen (TYLENOL) 500 MG tablet Take 500-1,000 mg by mouth every 6 (six) hours as needed (pain.).   aspirin EC 81 MG tablet Take 81 mg by mouth in the morning.   cetirizine (ZYRTEC) 10 MG tablet Take 10 mg by mouth at bedtime.    clopidogrel (PLAVIX) 75 MG tablet Take 1 tablet by mouth once daily   diltiazem (CARDIZEM CD) 120 MG 24 hr capsule TAKE 1 CAPSULE BY MOUTH IN THE MORNING AND AT BEDTIME   ezetimibe (ZETIA) 10 MG tablet TAKE 1 TABLET BY MOUTH ONCE DAILY IN THE EVENING   famotidine (PEPCID) 20 MG tablet Take 20 mg by mouth daily before supper.   levothyroxine (SYNTHROID) 25 MCG tablet TAKE 1 TABLET BY MOUTH BEFORE BREAKFAST   methocarbamol (ROBAXIN-750) 750 MG tablet Take 1 tablet (750 mg total) by mouth at bedtime. (Patient taking differently: Take 750 mg by mouth at bedtime as needed (spasms/pain).)   nitroGLYCERIN (NITROSTAT) 0.4 MG SL tablet Place 1 tablet (0.4 mg total) under the tongue every 5 (five) minutes as needed for chest pain.   rosuvastatin (CRESTOR) 40 MG tablet Take 1 tablet by mouth once daily (Patient taking differently: Take 40 mg by mouth at bedtime.)   sildenafil (VIAGRA) 100 MG tablet Take 1 tablet (100 mg total) by mouth daily as needed for erectile dysfunction. Take two hours prior to intercourse on an empty stomach   VITAMIN D, CHOLECALCIFEROL, PO Take 1 tablet by mouth in the morning.    Allergies:   Hydrocodone-acetaminophen and Vicodin [hydrocodone-acetaminophen]   Social History   Socioeconomic History   Marital status: Married    Spouse name: Carrie    Number of children: 2   Years of education: Not on file   Highest education level: Not on file  Occupational History   Not on file  Tobacco Use   Smoking status: Never    Passive  exposure: Never   Smokeless tobacco: Never  Vaping Use   Vaping Use: Never used  Substance and Sexual Activity   Alcohol use: Yes    Comment: Occ.    Drug use: No   Sexual activity: Yes    Birth control/protection: None  Other Topics Concern   Not on file  Social History Narrative   Not on file   Social Determinants of Health   Financial Resource Strain:   Not on file  Food Insecurity: No Food Insecurity (06/26/2019)   Hunger Vital Sign    Worried About Running Out of Food in the Last Year: Never true    Ran Out of Food in the Last Year: Never true  Transportation Needs: No Transportation Needs (06/26/2019)   PRAPARE - Transportation    Lack of Transportation (Medical): No    Lack of Transportation (Non-Medical): No  Physical Activity: Not on file  Stress: No Stress Concern Present (06/26/2019)   [employer] - Occupational Stress Questionnaire    Feeling of Stress : Only a little  Social Connections: Unknown (06/26/2019)   Social Connection and Isolation Panel [NHANES]    Frequency of Communication with Friends and Family: More than three times a week    Frequency of Social Gatherings with Friends and Family: Not on file    Attends Religious Services: Not on file    Active Member of Clubs or Organizations: Not on file    Attends Club or Organization Meetings: Not on file    Marital Status: Not on file     Family History:  The patient's family history includes Atrial fibrillation in his mother; Heart disease in his father; Stroke in his father.  ROS:   12-point review of systems is negative unless otherwise noted in the HPI.   EKGs/Labs/Other Studies Reviewed:    Studies reviewed were summarized above. The additional studies were reviewed today:  Lexiscan MPI 03/25/2022:   . The study is low risk.   There is no evidence for ischemia   Left ventricular function is normal. LVEF = 56%   LAD and RCA calcifications noted. __________   2D Echo  04/2018: 1. The left ventricle has normal systolic function, with an ejection fraction of 55-60%. The cavity size was normal. There is mild concentric left ventricular hypertrophy. Left ventricular diastolic parameters were normal.  2. The right ventricle has normal systolic function. The cavity was normal. There is no increase in right ventricular wall thickness.  3. The mitral valve is grossly normal.  4. The tricuspid valve is grossly normal.  5. The aortic valve has an indeterminate number of cusps. Aortic valve regurgitation is mild by color flow Doppler. __________   CTA chest/abd/pelvis: 1. No dissection.  No PE.  No aortic aneurysm. 2. Atherosclerotic changes are noted throughout the thoracic and abdominal aorta. 3. No acute intra-abdominal abnormality. 4. Severe sigmoid diverticulosis without CT evidence of diverticulitis. 5. Enlarged prostate gland. Bilateral fat containing inguinal hernias are noted.   Zio 05/2019: Normal sinus rhythm with an average heart rate of 73 bpm. 27 episodes of short SVTs.  The longest lasted 13 beats. __________   Coronary CTA/FFR: 1. Left Main: 0.98. 2. LAD: Ostial: 0.98, proximal: 0.50. 3. LCX: Proximal: 0.95, mid: 078. 4. OM1: 0.77. 5. RCA: Proximal: 0.98, mid: 0.82, PDA: 0.79.   IMPRESSION: 1. CT FFR analysis showed severe stenosis in the proximal LAD, mid RCA, mid LCX artery. A cardiac catheterization is recommended. __________   LHC 06/2019: Conclusions: Severe single-vessel CAD with 80-90% proximal LAD stenosis with heavy calcification. Mild to moderate, non-obstructive CAD involving mid LAD, mid LCx, and mid RCA. Elevated LVEDP with normal left ventricular contraction consistent with diastolic dysfunction.   Recommendations: Plan for staged PCI to proximal LAD at [facility] using atherectomy.  Will start patient on dual antiplatelet therapy with aspirin and clopidogrel.  Long-term, transitioning Mr. Hofland to NOAC + clopidogrel  will need to be considered with   his history of paroxysmal atrial fibrillation and CHADSVASC score of at least 2 (age + CAD). Aggressive secondary prevention. __________   PCI 06/2019: Mid LAD lesion is 55% stenosed. Prox Cx to Mid Cx lesion is 40% stenosed. Mid RCA lesion is 30% stenosed. Prox LAD to Mid LAD lesion is 85% stenosed. 2nd Diag lesion is 50% stenosed. Post intervention, there is a 0% residual stenosis. A drug-eluting stent was successfully placed using a STENT RESOLUTE ONYX3.0X38. Post intervention, there is a 0% residual stenosis. A drug-eluting stent was successfully placed using a STENT RESOLUTE ONYX 3.0X8. Post intervention, there is a 30% residual stenosis. Balloon angioplasty was performed using a BALLOON SAPPHIRE 2.5X20.   Successful complex orbital atherectomy and 2 overlapped drug-eluting stent placement to the mid and proximal LAD extending into the ostium with balloon angioplasty of the ostial second diagonal.  There was non-flow-limiting dissection in ostial diagonal with normal flow.  This did not require treatment.   Recommendations: Dual antiplatelet therapy for at least 1 year.  Aggressive treatment of risk factors.   EKG:  EKG is ordered today.  The EKG ordered today demonstrates NSR, 66 bpm, nonspecific lateral ST-T changes consistent with prior tracing  Recent Labs: 05/26/2022: Hemoglobin 14.5; Platelets 168 04/23/2023: ALT 36; BUN 17; Creatinine, Ser 0.94; Potassium 4.2; Sodium 140  Recent Lipid Panel    Component Value Date/Time   CHOL 132 04/23/2023 0857   CHOL 135 08/28/2020 0925   TRIG 83 04/23/2023 0857   HDL 46 04/23/2023 0857   HDL 46 08/28/2020 0925   CHOLHDL 2.9 04/23/2023 0857   VLDL 17 04/23/2023 0857   LDLCALC 69 04/23/2023 0857   LDLCALC 71 08/28/2020 0925    PHYSICAL EXAM:    VS:  BP 132/62 (BP Location: Left Arm, Patient Position: Sitting, Cuff Size: Normal)   Pulse 66   Ht 6' (1.829 m)   Wt 212 lb (96.2 kg)   SpO2 95%    BMI 28.75 kg/m   BMI: Body mass index is 28.75 kg/m.  Physical Exam Vitals reviewed.  Constitutional:      Appearance: He is well-developed.  HENT:     Head: Normocephalic and atraumatic.  Eyes:     General:        Right eye: No discharge.        Left eye: No discharge.  Neck:     Vascular: No JVD.  Cardiovascular:     Rate and Rhythm: Normal rate and regular rhythm.     Pulses:          Posterior tibial pulses are 2+ on the right side and 2+ on the left side.     Heart sounds: Normal heart sounds, S1 normal and S2 normal. Heart sounds not distant. No midsystolic click and no opening snap. No murmur heard.    No friction rub.  Pulmonary:     Effort: Pulmonary effort is normal. No respiratory distress.     Breath sounds: Normal breath sounds. No decreased breath sounds, wheezing or rales.  Chest:     Chest wall: No tenderness.  Abdominal:     General: There is no distension.  Musculoskeletal:     Cervical back: Normal range of motion.     Right lower leg: No edema.     Left lower leg: No edema.  Skin:    General: Skin is warm and dry.     Nails: There is no clubbing.  Neurological:     Mental Status: He is alert   and oriented to person, place, and time.  Psychiatric:        Speech: Speech normal.        Behavior: Behavior normal.        Thought Content: Thought content normal.        Judgment: Judgment normal.     Wt Readings from Last 3 Encounters:  04/30/23 212 lb (96.2 kg)  04/02/23 211 lb 9.6 oz (96 kg)  02/05/23 204 lb (92.5 kg)     ASSESSMENT & PLAN:   CAD involving the native coronary arteries with exertional fatigue and dyspnea concerning for anginal equivalent: Currently without symptoms of angina or cardiac decompensation.  Patient has been experiencing an increase in exertional fatigue and some exertional dyspnea that feels very similar, if not worse to his symptoms leading up to PCI in 2020.  Symptoms are concerning for anginal equivalent.  Schedule  echo to evaluate for any new structural abnormalities.  He did have a reassuring Lexiscan MPI 12 months ago, however symptoms are more pronounced at this time.  We have agreed to proceed with diagnostic LHC for definitive evaluation.  Continue aggressive risk factor modification and secondary prevention including aspirin, clopidogrel, ezetimibe, and rosuvastatin.  PAF/PSVT: Improved on Cardizem CD 120 mg twice daily.  Diastolic dysfunction: Euvolemic and well compensated.  No longer requiring standing loop diuretic.  HLD with familial hyperlipidemia: LDL 69 in 04/2023.  He remains on rosuvastatin 40 mg and ezetimibe.   Shared Decision Making/Informed Consent{  The risks [stroke (1 in 1000), death (1 in 1000), kidney failure [usually temporary] (1 in 500), bleeding (1 in 200), allergic reaction [possibly serious] (1 in 200)], benefits (diagnostic support and management of coronary artery disease) and alternatives of a cardiac catheterization were discussed in detail with Mr. Vanderweele and he is willing to proceed.     Disposition: F/u with Dr. Arida or an APP 1-2 weeks after LHC.   Medication Adjustments/Labs and Tests Ordered: Current medicines are reviewed at length with the patient today.  Concerns regarding medicines are outlined above. Medication changes, Labs and Tests ordered today are summarized above and listed in the Patient Instructions accessible in Encounters.   Signed, Elo Marmolejos, PA-C 04/30/2023 12:31 PM     [facility] [place] [address] [place], [place] [address] [phone] 

## 2023-04-30 NOTE — Progress Notes (Signed)
Cardiology Office Note    Date:  04/30/2023   ID:  [name] [name], DOB July 08, 1951, MRN [id number]  PCP:  Jacky Kindle, FNP  Cardiologist:  Lorine Bears, MD  Electrophysiologist:  None   Chief Complaint: Follow-up  History of Present Illness:   [name] [name] is a 72 y.o. male with history of CAD s/p PCI/DES to the LAD in 06/2019, PAF diagnosed in 2004 not on OAC in the setting of CHADS2VASc of 1, paroxysmal SVT, mild aortic valve regurgitation, diastolic dysfunction, hypothyroidism on Synthroid, severe possibly familial hyperlipidemia, elevated liver function, and strong family history of premature coronary artery disease on his father's side who presents for follow up of CAD and palpitations.   He was previously managed by Dr. Lois Huxley in [place], [place].  He was initially diagnosed with A. fib in 2004 and was managed with metoprolol though this was discontinued secondary to nightmares.  Since then, he has been managed with diltiazem.  Prior stress echo in 2013 was unremarkable.  Holter monitor in 12/2014 showed the predominant rhythm was sinus with an average heart rate of 71 bpm, minimum heart rate 45 bpm, maximum heart rate 118 bpm, rare isolated PVCs, frequent isolated PACs totaling 4587, 6 atrial couplets, 3 runs of SVT with the longest lasting 7 beats with a maximum rate of 141 bpm, no significant pauses.  No evidence of A. Fib.  He underwent treadmill stress test on 03/16/2018 that showed no evidence of ischemia with good exercise capacity with an exercise duration of 7 minutes and 48 seconds.  He was evaluated in 04/2019 for palpitations and chest pain.  Echo showed an EF of 55-60%, mild concentric LVH, normal diastolic function, normal RVSF, grossly normal mitral and tricuspid valves, mild AI.  Subsequent Zio in 05/2019 showed NSR with an average heart rate of 73 bpm with 27 episodes of short SVT with the longest episode lasting 13 beats.  He underwent coronary CTA in 06/2019, which showed left main  FFR 0.98, ostial LAD FFR 0.98, proximal LAD FFR 0.50, proximal LCx FFR 0.95, mid LCx FFR 0.78, OM1 FFR 0.77, proximal RCA FFR 0.98, mid RCA FFR 0.82, PDA FFR 0.79. Given this, he underwent diagnostic LHC on 06/26/2019 that showed severe single-vessel CAD with 80-90% proximal LAD stenosis with heavy calcification as well as mild to moderate nonobstructive CAD involving the mid LAD, mid LCx, and mid RCA with elevated LVEDP and normal LV contraction consistent with diastolic dysfunction. Given the heavy calcification of the LAD, it was recommended he undergo staged PCI with atherectomy. He underwent successful complex orbital atherectomy and 2 overlapping DES to the proximal and mid LAD extending into the ostium with balloon angioplasty of the ostial D2. Procedure was complicated by a non-flow limiting dissection in the ostial diagonal with normal flow that did not require treatment.    He was seen in the office in 07/2021 and was doing well from a cardiac perspective without symptoms of angina or decompensation.  He noted an improvement in his overall palpitation burden following prior titration of Cardizem CD to 240 mg daily.     He was seen on 03/19/2022 noting some palpitations and chest/leg heaviness and fatigue that felt similar to what he experienced leading up to his PCI.  At the time of his visit, his palpitation burden had improved.  Subsequent Lexiscan MPI on 03/25/2022 showed no evidence of ischemia with an EF of 56% and was overall low risk.  LAD and RCA calcifications were noted on CT  imaging.    He was seen in the office in 04/2022 and was without symptoms of angina or decompensation.  He also noted less palpitations, though did continue to have some in the p.m.  Given this, Cardizem CD was changed to 120 mg twice daily.  He was last seen in the office in 10/2022 and was without symptoms of angina or cardiac decompensation.  Palpitation burden had improved following transitioning off Cardizem to 120  mg twice daily.  However, with this he did note an increase in lower extremity swelling that was progressive throughout the day and improved when lying supine overnight.  He was started on low-dose furosemide 20 mg daily with recommendation to elevate legs and wear compression socks as well.  He comes in today noting an increase in generalized malaise and fatigue that feel the same when compared to how he fell leading up to his PCI in 2020.  This time, it appears as a fatigue and sluggishness are more pronounced.  He indicates he has even cut back on work, though remains quite fatigued.  He has been without frank chest pain or dyspnea.  He never had chest pain or dyspnea leading up to PCI in 2020.  No dizziness, presyncope, or syncope.  Adherent and tolerating cardiac medications without issues.  He does notice some intermittent blood on toilet tissue at times.  He does feel like he would be fatigued and short of breath if he had to climb a fair amount of stairs.   Labs independently reviewed: 04/2023 - potassium 4.2, BUN 17, serum creatinine 0.94, albumin 4.4, AST/ALT normal, TC 132, TG 83, HDL 46, LDL 69 05/2022 - Hgb 14.5, PLT 168 03/2022 - TSH normal,  Past Medical History:  Diagnosis Date   Acquired hypothyroidism    Aortic atherosclerosis (HCC)    Basal cell carcinoma 08/13/2022   Left dorsal forearm. Superficial and nodular. Tx with [facility] 09/17/22   Basal cell carcinoma 03/24/2023   Left medial calf. Superficial BCC with focal infiltration. [facility] 04/29/23   Coronary artery disease    a.) LHC 06/26/2019: EF 55-60%; LVEDP 25-30%; 80 pLAD, 40% mLAD, 40% p-mLCx, 30% mRCA; staged PCI planned. b.) LHC/staged PCI 07/05/2019: 55% mLAD, 40% p-mLCx, 30% mRCA, 85% p-mLAD, 50% D2 --> orbital atherectomy and PCI performed placing overlapping 3.0 x 8 mm (pLAD) and 3.0 x 30 mm (mLAD) Resolute Onyx DES x 2   Diverticulosis    ED (erectile dysfunction)    a.) on PDE5i (tadalifil)   Elevated LFTs    GERD  (gastroesophageal reflux disease)    HLD (hyperlipidemia)    Hypertension    Long term current use of antithrombotics/antiplatelets    a.) daily DAPT therapy (ASA + clopidogrel)   Melanoma (HCC)    Left shoulder. 1984   PAF (paroxysmal atrial fibrillation) (HCC) 2004   a.) CHA2DS2-VASc = 3 (age, HTN, aortic plaque). b.) rate/rhythm maintained on oral diltiazem; on daily DAPT (ASA + clopidogrel)   Paroxysmal SVT (supraventricular tachycardia)    Pure hypercholesterolemia    Skin cancer    Left forearm. NMSC. Removed 2008 or prior    Past Surgical History:  Procedure Laterality Date   CORONARY ATHERECTOMY N/A 07/05/2019   Procedure: CORONARY ATHERECTOMY;  Surgeon: Iran Ouch, MD;  Location: [facility];  Service: Cardiovascular;  Laterality: N/A;   CORONARY BALLOON ANGIOPLASTY N/A 07/05/2019   Procedure: CORONARY BALLOON ANGIOPLASTY;  Surgeon: Iran Ouch, MD;  Location: [facility];  Service: Cardiovascular;  Laterality: N/A;   CORONARY STENT INTERVENTION N/A 07/05/2019   Procedure: CORONARY STENT INTERVENTION;  Surgeon: Iran Ouch, MD;  Location: [facility];  Service: Cardiovascular;  Laterality: N/A;   EYE SURGERY Right 2014   Retina detachment   LEFT HEART CATH AND CORONARY ANGIOGRAPHY Left 06/26/2019   Procedure: LEFT HEART CATH AND CORONARY ANGIOGRAPHY;  Surgeon: Yvonne Kendall, MD;  Location: [facility] INVASIVE CV LAB;  Service: Cardiovascular;  Laterality: Left;   MELANOMA EXCISION  02/17/1983   NECK SURGERY  2004   SHOULDER ARTHROSCOPY WITH SUBACROMIAL DECOMPRESSION, ROTATOR CUFF REPAIR AND BICEP TENDON REPAIR Right 07/07/2022   Procedure: SUBACROMIAL DECOMPRESSION, ROTATOR CUFF REPAIR AND DISTAL CLAVICLE INCISION AND DEBRIDEMENT     distal clavicle incision, debridement;  Surgeon: Lyndle Herrlich, MD;  Location: [facility] ORS;  Service: Orthopedics;  Laterality: Right;   XI ROBOTIC ASSISTED INGUINAL HERNIA REPAIR WITH MESH Bilateral 04/18/2020    Procedure: XI ROBOTIC ASSISTED INGUINAL HERNIA REPAIR WITH MESH;  Surgeon: Leafy Ro, MD;  Location: [facility] ORS;  Service: General;  Laterality: Bilateral;    Current Medications: Current Meds  Medication Sig   acetaminophen (TYLENOL) 500 MG tablet Take 500-1,000 mg by mouth every 6 (six) hours as needed (pain.).   aspirin EC 81 MG tablet Take 81 mg by mouth in the morning.   cetirizine (ZYRTEC) 10 MG tablet Take 10 mg by mouth at bedtime.    clopidogrel (PLAVIX) 75 MG tablet Take 1 tablet by mouth once daily   diltiazem (CARDIZEM CD) 120 MG 24 hr capsule TAKE 1 CAPSULE BY MOUTH IN THE MORNING AND AT BEDTIME   ezetimibe (ZETIA) 10 MG tablet TAKE 1 TABLET BY MOUTH ONCE DAILY IN THE EVENING   famotidine (PEPCID) 20 MG tablet Take 20 mg by mouth daily before supper.   levothyroxine (SYNTHROID) 25 MCG tablet TAKE 1 TABLET BY MOUTH BEFORE BREAKFAST   methocarbamol (ROBAXIN-750) 750 MG tablet Take 1 tablet (750 mg total) by mouth at bedtime. (Patient taking differently: Take 750 mg by mouth at bedtime as needed (spasms/pain).)   nitroGLYCERIN (NITROSTAT) 0.4 MG SL tablet Place 1 tablet (0.4 mg total) under the tongue every 5 (five) minutes as needed for chest pain.   rosuvastatin (CRESTOR) 40 MG tablet Take 1 tablet by mouth once daily (Patient taking differently: Take 40 mg by mouth at bedtime.)   sildenafil (VIAGRA) 100 MG tablet Take 1 tablet (100 mg total) by mouth daily as needed for erectile dysfunction. Take two hours prior to intercourse on an empty stomach   VITAMIN D, CHOLECALCIFEROL, PO Take 1 tablet by mouth in the morning.    Allergies:   Hydrocodone-acetaminophen and Vicodin [hydrocodone-acetaminophen]   Social History   Socioeconomic History   Marital status: Married    Spouse name: Lyla Son    Number of children: 2   Years of education: Not on file   Highest education level: Not on file  Occupational History   Not on file  Tobacco Use   Smoking status: Never    Passive  exposure: Never   Smokeless tobacco: Never  Vaping Use   Vaping Use: Never used  Substance and Sexual Activity   Alcohol use: Yes    Comment: Occ.    Drug use: No   Sexual activity: Yes    Birth control/protection: None  Other Topics Concern   Not on file  Social History Narrative   Not on file   Social Determinants of Health   Financial Resource Strain:  Not on file  Food Insecurity: No Food Insecurity (06/26/2019)   Hunger Vital Sign    Worried About Running Out of Food in the Last Year: Never true    Ran Out of Food in the Last Year: Never true  Transportation Needs: No Transportation Needs (06/26/2019)   PRAPARE - Administrator, Civil Service (Medical): No    Lack of Transportation (Non-Medical): No  Physical Activity: Not on file  Stress: No Stress Concern Present (06/26/2019)   [employer] of Occupational Health - Occupational Stress Questionnaire    Feeling of Stress : Only a little  Social Connections: Unknown (06/26/2019)   Social Connection and Isolation Panel [NHANES]    Frequency of Communication with Friends and Family: More than three times a week    Frequency of Social Gatherings with Friends and Family: Not on file    Attends Religious Services: Not on file    Active Member of Clubs or Organizations: Not on file    Attends Banker Meetings: Not on file    Marital Status: Not on file     Family History:  The patient's family history includes Atrial fibrillation in his mother; Heart disease in his father; Stroke in his father.  ROS:   12-point review of systems is negative unless otherwise noted in the HPI.   EKGs/Labs/Other Studies Reviewed:    Studies reviewed were summarized above. The additional studies were reviewed today:  Lexiscan MPI 03/25/2022:   . The study is low risk.   There is no evidence for ischemia   Left ventricular function is normal. LVEF = 56%   LAD and RCA calcifications noted. __________   2D Echo  04/2018: 1. The left ventricle has normal systolic function, with an ejection fraction of 55-60%. The cavity size was normal. There is mild concentric left ventricular hypertrophy. Left ventricular diastolic parameters were normal.  2. The right ventricle has normal systolic function. The cavity was normal. There is no increase in right ventricular wall thickness.  3. The mitral valve is grossly normal.  4. The tricuspid valve is grossly normal.  5. The aortic valve has an indeterminate number of cusps. Aortic valve regurgitation is mild by color flow Doppler. __________   CTA chest/abd/pelvis: 1. No dissection.  No PE.  No aortic aneurysm. 2. Atherosclerotic changes are noted throughout the thoracic and abdominal aorta. 3. No acute intra-abdominal abnormality. 4. Severe sigmoid diverticulosis without CT evidence of diverticulitis. 5. Enlarged prostate gland. Bilateral fat containing inguinal hernias are noted.   Zio 05/2019: Normal sinus rhythm with an average heart rate of 73 bpm. 27 episodes of short SVTs.  The longest lasted 13 beats. __________   Coronary CTA/FFR: 1. Left Main: 0.98. 2. LAD: Ostial: 0.98, proximal: 0.50. 3. LCX: Proximal: 0.95, mid: 078. 4. OM1: 0.77. 5. RCA: Proximal: 0.98, mid: 0.82, PDA: 0.79.   IMPRESSION: 1. CT FFR analysis showed severe stenosis in the proximal LAD, mid RCA, mid LCX artery. A cardiac catheterization is recommended. __________   [facility] 06/2019: Conclusions: Severe single-vessel CAD with 80-90% proximal LAD stenosis with heavy calcification. Mild to moderate, non-obstructive CAD involving mid LAD, mid LCx, and mid RCA. Elevated LVEDP with normal left ventricular contraction consistent with diastolic dysfunction.   Recommendations: Plan for staged PCI to proximal LAD at [facility] using atherectomy.  Will start patient on dual antiplatelet therapy with aspirin and clopidogrel.  Long-term, transitioning Mr. Coard to NOAC + clopidogrel  will need to be considered with  his history of paroxysmal atrial fibrillation and CHADSVASC score of at least 2 (age + CAD). Aggressive secondary prevention. __________   PCI 06/2019: Mid LAD lesion is 55% stenosed. Prox Cx to Mid Cx lesion is 40% stenosed. Mid RCA lesion is 30% stenosed. Prox LAD to Mid LAD lesion is 85% stenosed. 2nd Diag lesion is 50% stenosed. Post intervention, there is a 0% residual stenosis. A drug-eluting stent was successfully placed using a STENT RESOLUTE NWGN5.6O13. Post intervention, there is a 0% residual stenosis. A drug-eluting stent was successfully placed using a STENT RESOLUTE ONYX 3.0X8. Post intervention, there is a 30% residual stenosis. Balloon angioplasty was performed using a BALLOON SAPPHIRE 2.5X20.   Successful complex orbital atherectomy and 2 overlapped drug-eluting stent placement to the mid and proximal LAD extending into the ostium with balloon angioplasty of the ostial second diagonal.  There was non-flow-limiting dissection in ostial diagonal with normal flow.  This did not require treatment.   Recommendations: Dual antiplatelet therapy for at least 1 year.  Aggressive treatment of risk factors.   EKG:  EKG is ordered today.  The EKG ordered today demonstrates NSR, 66 bpm, nonspecific lateral ST-T changes consistent with prior tracing  Recent Labs: 05/26/2022: Hemoglobin 14.5; Platelets 168 04/23/2023: ALT 36; BUN 17; Creatinine, Ser 0.94; Potassium 4.2; Sodium 140  Recent Lipid Panel    Component Value Date/Time   CHOL 132 04/23/2023 0857   CHOL 135 08/28/2020 0925   TRIG 83 04/23/2023 0857   HDL 46 04/23/2023 0857   HDL 46 08/28/2020 0925   CHOLHDL 2.9 04/23/2023 0857   VLDL 17 04/23/2023 0857   LDLCALC 69 04/23/2023 0857   LDLCALC 71 08/28/2020 0925    PHYSICAL EXAM:    VS:  BP 132/62 (BP Location: Left Arm, Patient Position: Sitting, Cuff Size: Normal)   Pulse 66   Ht 6' (1.829 m)   Wt 212 lb (96.2 kg)   SpO2 95%    BMI 28.75 kg/m   BMI: Body mass index is 28.75 kg/m.  Physical Exam Vitals reviewed.  Constitutional:      Appearance: He is well-developed.  HENT:     Head: Normocephalic and atraumatic.  Eyes:     General:        Right eye: No discharge.        Left eye: No discharge.  Neck:     Vascular: No JVD.  Cardiovascular:     Rate and Rhythm: Normal rate and regular rhythm.     Pulses:          Posterior tibial pulses are 2+ on the right side and 2+ on the left side.     Heart sounds: Normal heart sounds, S1 normal and S2 normal. Heart sounds not distant. No midsystolic click and no opening snap. No murmur heard.    No friction rub.  Pulmonary:     Effort: Pulmonary effort is normal. No respiratory distress.     Breath sounds: Normal breath sounds. No decreased breath sounds, wheezing or rales.  Chest:     Chest wall: No tenderness.  Abdominal:     General: There is no distension.  Musculoskeletal:     Cervical back: Normal range of motion.     Right lower leg: No edema.     Left lower leg: No edema.  Skin:    General: Skin is warm and dry.     Nails: There is no clubbing.  Neurological:     Mental Status: He is alert  and oriented to person, place, and time.  Psychiatric:        Speech: Speech normal.        Behavior: Behavior normal.        Thought Content: Thought content normal.        Judgment: Judgment normal.     Wt Readings from Last 3 Encounters:  04/30/23 212 lb (96.2 kg)  04/02/23 211 lb 9.6 oz (96 kg)  02/05/23 204 lb (92.5 kg)     ASSESSMENT & PLAN:   CAD involving the native coronary arteries with exertional fatigue and dyspnea concerning for anginal equivalent: Currently without symptoms of angina or cardiac decompensation.  Patient has been experiencing an increase in exertional fatigue and some exertional dyspnea that feels very similar, if not worse to his symptoms leading up to PCI in 2020.  Symptoms are concerning for anginal equivalent.  Schedule  echo to evaluate for any new structural abnormalities.  He did have a reassuring Lexiscan MPI 12 months ago, however symptoms are more pronounced at this time.  We have agreed to proceed with diagnostic LHC for definitive evaluation.  Continue aggressive risk factor modification and secondary prevention including aspirin, clopidogrel, ezetimibe, and rosuvastatin.  PAF/PSVT: Improved on Cardizem CD 120 mg twice daily.  Diastolic dysfunction: Euvolemic and well compensated.  No longer requiring standing loop diuretic.  HLD with familial hyperlipidemia: LDL 69 in 04/2023.  He remains on rosuvastatin 40 mg and ezetimibe.   Shared Decision Making/Informed Consent{  The risks [stroke (1 in 1000), death (1 in 1000), kidney failure [usually temporary] (1 in 500), bleeding (1 in 200), allergic reaction [possibly serious] (1 in 200)], benefits (diagnostic support and management of coronary artery disease) and alternatives of a cardiac catheterization were discussed in detail with Mr. Giarrusso and he is willing to proceed.     Disposition: F/u with Dr. Kirke Corin or an APP 1-2 weeks after LHC.   Medication Adjustments/Labs and Tests Ordered: Current medicines are reviewed at length with the patient today.  Concerns regarding medicines are outlined above. Medication changes, Labs and Tests ordered today are summarized above and listed in the Patient Instructions accessible in Encounters.   Signed, Eula Listen, PA-C 04/30/2023 12:31 PM     [facility] [address] [place], [place] [address] [phone]

## 2023-04-30 NOTE — Telephone Encounter (Signed)
Spoke with patient and directed him to his AVS that stated that he needed to have lab drawn today (04/30/23) and that his cath is at [facility]: [address] [place], [place] [address] at 12:30 PM (This time is 2 hour(s) before your procedure to ensure your preparation). Patient understood with read back

## 2023-04-30 NOTE — Patient Instructions (Addendum)
Medication Instructions:  No changes at this time.   *If you need a refill on your cardiac medications before your next appointment, please call your pharmacy*   Lab Work: CBC & BMET today over at the [facility] and check in at registration.   If you have labs (blood work) drawn today and your tests are completely normal, you will receive your results only by: MyChart Message (if you have MyChart) OR A paper copy in the mail If you have any lab test that is abnormal or we need to change your treatment, we will call you to review the results.   Testing/Procedures:  [place] [facility] DEPT OF [facility] [place] [address] [place] [address] Dept: [phone] Loc: [phone]  Strummer Polman  04/30/2023  You are scheduled for a Cardiac Catheterization on Wednesday, May 29 with Dr. Lorine Bears.  1. Please arrive at the [facility] (Main Entrance A) at [facility]: [address] [place], [place] [address] at 12:30 PM (This time is 2 hour(s) before your procedure to ensure your preparation). Free valet parking service is available. You will check in at ADMITTING. The support person will be asked to wait in the waiting room.  It is OK to have someone drop you off and come back when you are ready to be discharged.    Special note: Every effort is made to have your procedure done on time. Please understand that emergencies sometimes delay scheduled procedures.  2. Diet: Do not eat solid foods after midnight.  The patient may have clear liquids until 5am upon the day of the procedure.  3. Labs: [facility]  4. Medication instructions in preparation for your procedure: You may take all of your medications with small sip of water.    Contrast Allergy: No  On the morning of your procedure, take your Aspirin 81 mg and Plavix/Clopidogrel and any morning medicines NOT listed  above.  You may use sips of water.  5. Plan to go home the same day, you will only stay overnight if medically necessary. 6. Bring a current list of your medications and current insurance cards. 7. You MUST have a responsible person to drive you home. 8. Someone MUST be with you the first 24 hours after you arrive home or your discharge will be delayed. 9. Please wear clothes that are easy to get on and off and wear slip-on shoes.  Thank you for allowing [place] to care for you!   -- [facility]  Your physician has requested that you have an echocardiogram. Echocardiography is a painless test that uses sound waves to create images of your heart. It provides your doctor with information about the size and shape of your heart and how well your heart's chambers and valves are working. This procedure takes approximately one hour. There are no restrictions for this procedure. Please do NOT wear cologne, perfume, aftershave, or lotions (deodorant is allowed). Please arrive 15 minutes prior to your appointment time.   Follow-Up: At [facility], you and your health needs are our priority.  As part of our continuing mission to provide you with exceptional heart care, we have created designated Provider Care Teams.  These Care Teams include your primary Cardiologist (physician) and Advanced Practice Providers (APPs -  Physician Assistants and Nurse Practitioners) who all work together to provide you with the care you need,  when you need it.   Your next appointment:   3-4 week(s)  Provider:   Lorine Bears, MD or Eula Listen, PA-C

## 2023-04-30 NOTE — Telephone Encounter (Signed)
Patient would like to know if he needs to have repeat labs for heart cath and also what time he needs to arrive.

## 2023-05-04 ENCOUNTER — Telehealth: Payer: Self-pay | Admitting: *Deleted

## 2023-05-04 NOTE — Telephone Encounter (Addendum)
Cardiac Catheterization scheduled at [facility] for: Wednesday May 05, 2023 2:30 PM Arrival time [facility] Main Entrance A at: 12:30 PM  Nothing to eat after midnight prior to procedure, clear liquids until 5 AM day of procedure.   Medication instructions: -Usual morning medications can be taken with sips of water including aspirin 81 mg and Plavix 75 mg  Confirmed patient has responsible adult to drive home post procedure and be with patient first 24 hours after arriving home.  Plan to go home the same day, you will only stay overnight if medically necessary.  Reviewed procedure instructions with patient.

## 2023-05-05 ENCOUNTER — Other Ambulatory Visit: Payer: Self-pay

## 2023-05-05 ENCOUNTER — Ambulatory Visit (HOSPITAL_COMMUNITY)
Admission: RE | Admit: 2023-05-05 | Discharge: 2023-05-05 | Disposition: A | Discharge status: Home / Self Care | Payer: Medicare Other | Attending: Cardiovascular Disease | Admitting: Cardiovascular Disease

## 2023-05-05 ENCOUNTER — Encounter (HOSPITAL_COMMUNITY)
Admission: RE | Disposition: A | Discharge status: Home / Self Care | Payer: Self-pay | Source: Home / Self Care | Attending: Cardiovascular Disease

## 2023-05-05 DIAGNOSIS — I10 Essential (primary) hypertension: Secondary | ICD-10-CM | POA: Insufficient documentation

## 2023-05-05 DIAGNOSIS — Z79899 Other long term (current) drug therapy: Secondary | ICD-10-CM | POA: Diagnosis not present

## 2023-05-05 DIAGNOSIS — Z8249 Family history of ischemic heart disease and other diseases of the circulatory system: Secondary | ICD-10-CM | POA: Insufficient documentation

## 2023-05-05 DIAGNOSIS — Z006 Encounter for examination for normal comparison and control in clinical research program: Secondary | ICD-10-CM

## 2023-05-05 DIAGNOSIS — E7849 Other hyperlipidemia: Secondary | ICD-10-CM | POA: Diagnosis not present

## 2023-05-05 DIAGNOSIS — I480 Paroxysmal atrial fibrillation: Secondary | ICD-10-CM | POA: Diagnosis not present

## 2023-05-05 DIAGNOSIS — I25118 Atherosclerotic heart disease of native coronary artery with other forms of angina pectoris: Secondary | ICD-10-CM | POA: Diagnosis not present

## 2023-05-05 DIAGNOSIS — Z955 Presence of coronary angioplasty implant and graft: Secondary | ICD-10-CM | POA: Insufficient documentation

## 2023-05-05 HISTORY — PX: LEFT HEART CATH AND CORONARY ANGIOGRAPHY: CATH118249

## 2023-05-05 SURGERY — LEFT HEART CATH AND CORONARY ANGIOGRAPHY
Anesthesia: LOCAL

## 2023-05-05 MED ORDER — SODIUM CHLORIDE 0.9% FLUSH: 3.0000 mL | Freq: Two times a day (BID) | INTRAVENOUS | Status: DC

## 2023-05-05 MED ORDER — LIDOCAINE HCL (PF) 1 % IJ SOLN
INTRAMUSCULAR | Status: DC | PRN
  Administered 2023-05-05: 2 mL

## 2023-05-05 MED ORDER — FENTANYL CITRATE (PF) 100 MCG/2ML IJ SOLN
INTRAMUSCULAR | Status: AC
  Filled 2023-05-05: qty 2

## 2023-05-05 MED ORDER — SODIUM CHLORIDE 0.9 % IV SOLN
INTRAVENOUS | Status: DC
Start: 2023-05-05 — End: 2023-05-05

## 2023-05-05 MED ORDER — CLOPIDOGREL BISULFATE 75 MG PO TABS: 75.0000 mg | ORAL_TABLET | ORAL | Status: DC

## 2023-05-05 MED ORDER — ONDANSETRON HCL 4 MG/2ML IJ SOLN: 4.0000 mg | Freq: Four times a day (QID) | INTRAMUSCULAR | Status: DC | PRN

## 2023-05-05 MED ORDER — VERAPAMIL HCL 2.5 MG/ML IV SOLN
INTRAVENOUS | Status: AC
  Filled 2023-05-05: qty 2

## 2023-05-05 MED ORDER — ACETAMINOPHEN 325 MG PO TABS: 650.0000 mg | ORAL_TABLET | ORAL | Status: DC | PRN

## 2023-05-05 MED ORDER — ASPIRIN 81 MG PO CHEW: 81.0000 mg | CHEWABLE_TABLET | ORAL | Status: DC

## 2023-05-05 MED ORDER — MIDAZOLAM HCL 2 MG/2ML IJ SOLN
INTRAMUSCULAR | Status: AC
  Filled 2023-05-05: qty 2

## 2023-05-05 MED ORDER — SODIUM CHLORIDE 0.9 % WEIGHT BASED INFUSION
3.0000 mL/kg/h | INTRAVENOUS | Status: AC
  Administered 2023-05-05: 3 mL/kg/h via INTRAVENOUS

## 2023-05-05 MED ORDER — SODIUM CHLORIDE 0.9 % IV SOLN
250.0000 mL | INTRAVENOUS | Status: DC | PRN
Start: 2023-05-05 — End: 2023-05-05

## 2023-05-05 MED ORDER — HEPARIN SODIUM (PORCINE) 1000 UNIT/ML IJ SOLN
INTRAMUSCULAR | Status: AC
  Filled 2023-05-05: qty 10

## 2023-05-05 MED ORDER — VERAPAMIL HCL 2.5 MG/ML IV SOLN
INTRAVENOUS | Status: DC | PRN
  Administered 2023-05-05: 10 mL via INTRA_ARTERIAL

## 2023-05-05 MED ORDER — HEPARIN (PORCINE) IN NACL 1000-0.9 UT/500ML-% IV SOLN
INTRAVENOUS | Status: DC | PRN
  Administered 2023-05-05 (×2): 500 mL

## 2023-05-05 MED ORDER — IOHEXOL 350 MG/ML SOLN
INTRAVENOUS | Status: DC | PRN
  Administered 2023-05-05: 70 mL via INTRA_ARTERIAL

## 2023-05-05 MED ORDER — LIDOCAINE HCL (PF) 1 % IJ SOLN
INTRAMUSCULAR | Status: AC
  Filled 2023-05-05: qty 30

## 2023-05-05 MED ORDER — SODIUM CHLORIDE 0.9% FLUSH: 3.0000 mL | INTRAVENOUS | Status: DC | PRN

## 2023-05-05 MED ORDER — SODIUM CHLORIDE 0.9 % WEIGHT BASED INFUSION: 1.0000 mL/kg/h | INTRAVENOUS | Status: DC

## 2023-05-05 MED ORDER — MIDAZOLAM HCL 2 MG/2ML IJ SOLN
INTRAMUSCULAR | Status: DC | PRN
  Administered 2023-05-05: 1 mg via INTRAVENOUS

## 2023-05-05 MED ORDER — FENTANYL CITRATE (PF) 100 MCG/2ML IJ SOLN
INTRAMUSCULAR | Status: DC | PRN
  Administered 2023-05-05: 25 ug via INTRAVENOUS

## 2023-05-05 MED ORDER — HEPARIN SODIUM (PORCINE) 1000 UNIT/ML IJ SOLN
INTRAMUSCULAR | Status: DC | PRN
  Administered 2023-05-05: 4000 [IU] via INTRAVENOUS

## 2023-05-05 MED ORDER — SODIUM CHLORIDE 0.9 % IV SOLN: 250.0000 mL | INTRAVENOUS | Status: DC | PRN

## 2023-05-05 SURGICAL SUPPLY — 11 items
BAND CMPR LRG ZPHR (HEMOSTASIS) ×1
BAND ZEPHYR COMPRESS 30 LONG (HEMOSTASIS) IMPLANT
CATH 5FR JL3.5 JR4 ANG PIG MP (CATHETERS) IMPLANT
CATH INFINITI 5FR JK (CATHETERS) IMPLANT
GLIDESHEATH SLEND SS 6F .021 (SHEATH) IMPLANT
GUIDEWIRE INQWIRE 1.5J.035X260 (WIRE) IMPLANT
INQWIRE 1.5J .035X260CM (WIRE) ×1
KIT HEART LEFT (KITS) ×1 IMPLANT
PACK CARDIAC CATHETERIZATION (CUSTOM PROCEDURE TRAY) ×1 IMPLANT
TRANSDUCER W/STOPCOCK (MISCELLANEOUS) ×1 IMPLANT
TUBING CIL FLEX 10 FLL-RA (TUBING) ×1 IMPLANT

## 2023-05-05 NOTE — Interval H&P Note (Signed)
History and Physical Interval Note:  05/05/2023 4:03 PM  [name] [name]  has presented today for surgery, with the diagnosis of cad - angina.  The various methods of treatment have been discussed with the patient and family. After consideration of risks, benefits and other options for treatment, the patient has consented to  Procedure(s): LEFT HEART CATH AND CORONARY ANGIOGRAPHY (N/A) as a surgical intervention.  The patient's history has been reviewed, patient examined, no change in status, stable for surgery.  I have reviewed the patient's chart and labs.  Questions were answered to the patient's satisfaction.     Lorine Bears

## 2023-05-05 NOTE — Research (Cosign Needed Addendum)
Selution Informed Consent   Subject Name: [name] [name]  Subject met inclusion and exclusion criteria.  The informed consent form, study requirements and expectations were reviewed with the subject and questions and concerns were addressed prior to the signing of the consent form.  The subject verbalized understanding of the trial requirements.  The subject agreed to participate in the Selution trial and signed the informed consent on 29/May/2024.  The informed consent was obtained prior to performance of any protocol-specific procedures for the subject.  A copy of the signed informed consent was given to the subject and a copy was placed in the subject's medical record.   [name] [name]    SCREEN FAIL

## 2023-05-06 ENCOUNTER — Encounter (HOSPITAL_COMMUNITY): Payer: Self-pay | Admitting: Cardiovascular Disease

## 2023-05-06 MED FILL — Verapamil HCl IV Soln 2.5 MG/ML: INTRAVENOUS | Qty: 2 | Status: AC

## 2023-05-13 ENCOUNTER — Ambulatory Visit: Payer: Medicare Other | Admitting: Urology

## 2023-05-31 ENCOUNTER — Ambulatory Visit: Payer: Medicare Other | Attending: Physician Assistant

## 2023-05-31 DIAGNOSIS — I25118 Atherosclerotic heart disease of native coronary artery with other forms of angina pectoris: Secondary | ICD-10-CM | POA: Diagnosis not present

## 2023-05-31 LAB — ECHOCARDIOGRAM COMPLETE
AR max vel: 3.55 cm2
AV Area VTI: 3.31 cm2
AV Area mean vel: 3.31 cm2
AV Mean grad: 5 mmHg
AV Peak grad: 8.8 mmHg
AV Vena cont: 0.45 cm
Ao pk vel: 1.48 m/s
Area-P 1/2: 3.48 cm2
Est EF: 55
P 1/2 time: 220 msec
S' Lateral: 3.9 cm

## 2023-05-31 MED ORDER — PERFLUTREN LIPID MICROSPHERE
1.0000 mL | INTRAVENOUS | Status: AC | PRN
Start: 2023-05-31 — End: 2023-05-31
  Administered 2023-05-31: 2 mL via INTRAVENOUS

## 2023-06-01 ENCOUNTER — Other Ambulatory Visit: Payer: Self-pay | Admitting: *Deleted

## 2023-06-01 DIAGNOSIS — I480 Paroxysmal atrial fibrillation: Secondary | ICD-10-CM

## 2023-06-26 ENCOUNTER — Other Ambulatory Visit: Payer: Self-pay | Admitting: Physician Assistant

## 2023-06-28 ENCOUNTER — Ambulatory Visit: Payer: Medicare Other | Attending: Physician Assistant | Admitting: Physician Assistant

## 2023-06-28 ENCOUNTER — Encounter: Payer: Self-pay | Admitting: Physician Assistant

## 2023-06-28 VITALS — BP 124/60 | HR 67 | Ht 72.0 in | Wt 213.0 lb

## 2023-06-28 DIAGNOSIS — I480 Paroxysmal atrial fibrillation: Secondary | ICD-10-CM

## 2023-06-28 DIAGNOSIS — I471 Supraventricular tachycardia, unspecified: Secondary | ICD-10-CM

## 2023-06-28 DIAGNOSIS — I351 Nonrheumatic aortic (valve) insufficiency: Secondary | ICD-10-CM

## 2023-06-28 DIAGNOSIS — E785 Hyperlipidemia, unspecified: Secondary | ICD-10-CM | POA: Diagnosis not present

## 2023-06-28 DIAGNOSIS — I25118 Atherosclerotic heart disease of native coronary artery with other forms of angina pectoris: Secondary | ICD-10-CM | POA: Diagnosis not present

## 2023-06-28 DIAGNOSIS — E7849 Other hyperlipidemia: Secondary | ICD-10-CM

## 2023-06-28 MED ORDER — CLOPIDOGREL BISULFATE 75 MG PO TABS: 75.0000 mg | ORAL_TABLET | Freq: Every day | ORAL | 3 refills | Status: DC

## 2023-06-28 NOTE — Progress Notes (Signed)
Cardiology Office Note    Date:  06/28/2023   ID:  [name] [name], DOB 1951-02-16, MRN [id number]  PCP:  Jacky Kindle, FNP  Cardiologist:  Lorine Bears, MD  Electrophysiologist:  None   Chief Complaint: Follow up  History of Present Illness:   [name] [name] is a 72 y.o. male with history of CAD s/p PCI/DES to the LAD in 06/2019, PAF diagnosed in 2004 not on OAC in the setting of CHADS2VASc of 1, paroxysmal SVT, mild aortic valve regurgitation, diastolic dysfunction, hypothyroidism on Synthroid, severe possibly familial hyperlipidemia, elevated liver function, and strong family history of premature coronary artery disease on his father's side who presents for follow up of LHC and echo.   He was previously managed by Dr. Lois Huxley in [place], [place].  He was initially diagnosed with A. fib in 2004 and was managed with metoprolol though this was discontinued secondary to nightmares.  Since then, he has been managed with diltiazem.  Prior stress echo in 2013 was unremarkable.  Holter monitor in 12/2014 showed the predominant rhythm was sinus with an average heart rate of 71 bpm, minimum heart rate 45 bpm, maximum heart rate 118 bpm, rare isolated PVCs, frequent isolated PACs totaling 4587, 6 atrial couplets, 3 runs of SVT with the longest lasting 7 beats with a maximum rate of 141 bpm, no significant pauses.  No evidence of A. Fib.  He underwent treadmill stress test on 03/16/2018 that showed no evidence of ischemia with good exercise capacity with an exercise duration of 7 minutes and 48 seconds.  He was evaluated in 04/2019 for palpitations and chest pain.  Echo showed an EF of 55-60%, mild concentric LVH, normal diastolic function, normal RVSF, grossly normal mitral and tricuspid valves, mild AI.  Subsequent Zio in 05/2019 showed NSR with an average heart rate of 73 bpm with 27 episodes of short SVT with the longest episode lasting 13 beats.  He underwent coronary CTA in 06/2019, which showed left main FFR  0.98, ostial LAD FFR 0.98, proximal LAD FFR 0.50, proximal LCx FFR 0.95, mid LCx FFR 0.78, OM1 FFR 0.77, proximal RCA FFR 0.98, mid RCA FFR 0.82, PDA FFR 0.79. Given this, he underwent diagnostic LHC on 06/26/2019 that showed severe single-vessel CAD with 80-90% proximal LAD stenosis with heavy calcification as well as mild to moderate nonobstructive CAD involving the mid LAD, mid LCx, and mid RCA with elevated LVEDP and normal LV contraction consistent with diastolic dysfunction. Given the heavy calcification of the LAD, it was recommended he undergo staged PCI with atherectomy. He underwent successful complex orbital atherectomy and 2 overlapping DES to the proximal and mid LAD extending into the ostium with balloon angioplasty of the ostial D2. Procedure was complicated by a non-flow limiting dissection in the ostial diagonal with normal flow that did not require treatment.    He was seen in the office in 07/2021 and was doing well from a cardiac perspective without symptoms of angina or decompensation.  He noted an improvement in his overall palpitation burden following prior titration of Cardizem CD to 240 mg daily.     He was seen on 03/19/2022 noting some palpitations and chest/leg heaviness and fatigue that felt similar to what he experienced leading up to his PCI.  At the time of his visit, his palpitation burden had improved.  Subsequent Lexiscan MPI on 03/25/2022 showed no evidence of ischemia with an EF of 56% and was overall low risk.  LAD and RCA calcifications were noted on  CT imaging.    He was seen in the office in 04/2022 and was without symptoms of angina or decompensation.  He also noted less palpitations, though did continue to have some in the p.m.  Given this, Cardizem CD was changed to 120 mg twice daily.   He was seen in the office in 10/2022 and was without symptoms of angina or cardiac decompensation.  Palpitation burden had improved following transitioning off Cardizem to 120 mg twice  daily.  However, with this he did note an increase in lower extremity swelling that was progressive throughout the day and improved when lying supine overnight.  He was started on low-dose furosemide 20 mg daily with recommendation to elevate legs and wear compression socks as well.  He was last seen in the office in 04/2023 noting an increase in generalized malaise and fatigue that felt similar to what he experienced leading up to his PCI in 2020.  He was without frank chest pain or dyspnea.  LHC on 05/05/2023 showed patent LAD stents with minimal restenosis and stable mild to moderate LCx and RCA disease with normal LV systolic function and mildly elevated LVEDP at 20 mmHg.  Medical therapy was recommended.  Echo on 05/27/2023 showed an EF of 55%, no regional wall motion abnormalities, normal LV diastolic function parameters, normal RV systolic function and ventricular cavity size, tricuspid aortic valve with mild to moderate insufficiency, borderline dilatation of the ascending aorta measuring 38 mm, and an estimated right atrial pressure of 3 mmHg.  He comes in doing well from a cardiac perspective and is without symptoms of angina or cardiac decompensation.  Underlying fatigue is improving.  No dyspnea, palpitations, dizziness, presyncope, or syncope.  No right radial arteriotomy site complications.  Adherent and tolerating cardiac medications.   Labs independently reviewed: 04/2023 - Hgb 14.4, PLT 170, potassium 3.7, BUN 23, serum creatinine 0.79, albumin 4.4, AST/ALT normal, TC 132, TG 83, HDL 46, LDL 69 03/2022 - TSH normal  Past Medical History:  Diagnosis Date   Acquired hypothyroidism    Aortic atherosclerosis (HCC)    Basal cell carcinoma 08/13/2022   Left dorsal forearm. Superficial and nodular. Tx with [facility] 09/17/22   Basal cell carcinoma 03/24/2023   Left medial calf. Superficial BCC with focal infiltration. [facility] 04/29/23   Coronary artery disease    a.) LHC 06/26/2019: EF 55-60%; LVEDP  25-30%; 80 pLAD, 40% mLAD, 40% p-mLCx, 30% mRCA; staged PCI planned. b.) LHC/staged PCI 07/05/2019: 55% mLAD, 40% p-mLCx, 30% mRCA, 85% p-mLAD, 50% D2 --> orbital atherectomy and PCI performed placing overlapping 3.0 x 8 mm (pLAD) and 3.0 x 30 mm (mLAD) Resolute Onyx DES x 2   Diverticulosis    ED (erectile dysfunction)    a.) on PDE5i (tadalifil)   Elevated LFTs    GERD (gastroesophageal reflux disease)    HLD (hyperlipidemia)    Hypertension    Long term current use of antithrombotics/antiplatelets    a.) daily DAPT therapy (ASA + clopidogrel)   Melanoma (HCC)    Left shoulder. 1984   PAF (paroxysmal atrial fibrillation) (HCC) 2004   a.) CHA2DS2-VASc = 3 (age, HTN, aortic plaque). b.) rate/rhythm maintained on oral diltiazem; on daily DAPT (ASA + clopidogrel)   Paroxysmal SVT (supraventricular tachycardia)    Pure hypercholesterolemia    Skin cancer    Left forearm. NMSC. Removed 2008 or prior    Past Surgical History:  Procedure Laterality Date   CORONARY ATHERECTOMY N/A 07/05/2019   Procedure: CORONARY ATHERECTOMY;  Surgeon: Iran Ouch, MD;  Location[facility] INVASIVE CV LAB;  Service: Cardiovascular;  Laterality: N/A;   CORONARY BALLOON ANGIOPLASTY N/A 07/05/2019   Procedure: CORONARY BALLOON ANGIOPLASTY;  Surgeon: Iran Ouch, MD;  Location: [facility] INVASIVE CV LAB;  Service: Cardiovascular;  Laterality: N/A;   CORONARY STENT INTERVENTION N/A 07/05/2019   Procedure: CORONARY STENT INTERVENTION;  Surgeon: Iran Ouch, MD;  Location: [facility] INVASIVE CV LAB;  Service: Cardiovascular;  Laterality: N/A;   EYE SURGERY Right 2014   Retina detachment   LEFT HEART CATH AND CORONARY ANGIOGRAPHY Left 06/26/2019   Procedure: LEFT HEART CATH AND CORONARY ANGIOGRAPHY;  Surgeon: Yvonne Kendall, MD;  Location: [facility] INVASIVE CV LAB;  Service: Cardiovascular;  Laterality: Left;   LEFT HEART CATH AND CORONARY ANGIOGRAPHY N/A 05/05/2023   Procedure: LEFT HEART CATH AND CORONARY ANGIOGRAPHY;   Surgeon: Iran Ouch, MD;  Location: [facility] INVASIVE CV LAB;  Service: Cardiovascular;  Laterality: N/A;   MELANOMA EXCISION  02/17/1983   NECK SURGERY  2004   SHOULDER ARTHROSCOPY WITH SUBACROMIAL DECOMPRESSION, ROTATOR CUFF REPAIR AND BICEP TENDON REPAIR Right 07/07/2022   Procedure: SUBACROMIAL DECOMPRESSION, ROTATOR CUFF REPAIR AND DISTAL CLAVICLE INCISION AND DEBRIDEMENT     distal clavicle incision, debridement;  Surgeon: Lyndle Herrlich, MD;  Location: [facility] ORS;  Service: Orthopedics;  Laterality: Right;   XI ROBOTIC ASSISTED INGUINAL HERNIA REPAIR WITH MESH Bilateral 04/18/2020   Procedure: XI ROBOTIC ASSISTED INGUINAL HERNIA REPAIR WITH MESH;  Surgeon: Leafy Ro, MD;  Location: [facility] ORS;  Service: General;  Laterality: Bilateral;    Current Medications: Current Meds  Medication Sig   acetaminophen (TYLENOL) 500 MG tablet Take 500-1,000 mg by mouth every 6 (six) hours as needed (pain.).   aspirin EC 81 MG tablet Take 81 mg by mouth in the morning.   cetirizine (ZYRTEC) 10 MG tablet Take 10 mg by mouth at bedtime.    diltiazem (CARDIZEM CD) 120 MG 24 hr capsule TAKE 1 CAPSULE BY MOUTH IN THE MORNING AND AT BEDTIME   ezetimibe (ZETIA) 10 MG tablet TAKE 1 TABLET BY MOUTH ONCE DAILY IN THE EVENING   famotidine (PEPCID) 20 MG tablet Take 20 mg by mouth daily before supper.   fluticasone (FLONASE) 50 MCG/ACT nasal spray Place 1 spray into both nostrils in the morning.   levothyroxine (SYNTHROID) 25 MCG tablet TAKE 1 TABLET BY MOUTH BEFORE BREAKFAST   Misc Natural Products (OSTEO BI-FLEX TRIPLE STRENGTH PO) Take 2 tablets by mouth in the morning.   Multiple Vitamin (MULTIVITAMIN WITH MINERALS) TABS tablet Take 1 tablet by mouth in the morning.   nitroGLYCERIN (NITROSTAT) 0.4 MG SL tablet Place 1 tablet (0.4 mg total) under the tongue every 5 (five) minutes as needed for chest pain.   Nutritional Supplements (FRUIT & VEGETABLE DAILY) CAPS Take 6 capsules by mouth in the morning. Balance  of Nature Fruits (3) & Veggies (3)   rosuvastatin (CRESTOR) 40 MG tablet Take 1 tablet by mouth once daily (Patient taking differently: Take 40 mg by mouth at bedtime.)   sildenafil (VIAGRA) 100 MG tablet Take 1 tablet (100 mg total) by mouth daily as needed for erectile dysfunction. Take two hours prior to intercourse on an empty stomach   Turmeric 500 MG CAPS Take 500 mg by mouth in the morning.   VITAMIN D, CHOLECALCIFEROL, PO Take 1 tablet by mouth in the morning.   [DISCONTINUED] clopidogrel (PLAVIX) 75 MG tablet Take 1 tablet by mouth once daily    Allergies:  Hydrocodone-acetaminophen and Vicodin [hydrocodone-acetaminophen]   Social History   Socioeconomic History   Marital status: Married    Spouse name: Lyla Son    Number of children: 2   Years of education: Not on file   Highest education level: Not on file  Occupational History   Not on file  Tobacco Use   Smoking status: Never    Passive exposure: Never   Smokeless tobacco: Never  Vaping Use   Vaping status: Never Used  Substance and Sexual Activity   Alcohol use: Yes    Comment: Occ.    Drug use: No   Sexual activity: Yes    Birth control/protection: None  Other Topics Concern   Not on file  Social History Narrative   Not on file   Social Determinants of Health   Financial Resource Strain: Not on file  Food Insecurity: No Food Insecurity (06/26/2019)   Hunger Vital Sign    Worried About Running Out of Food in the Last Year: Never true    Ran Out of Food in the Last Year: Never true  Transportation Needs: No Transportation Needs (06/26/2019)   PRAPARE - Administrator, Civil Service (Medical): No    Lack of Transportation (Non-Medical): No  Physical Activity: Not on file  Stress: No Stress Concern Present (06/26/2019)   [employer] of Occupational Health - Occupational Stress Questionnaire    Feeling of Stress : Only a little  Social Connections: Unknown (06/26/2019)   Social Connection and  Isolation Panel [NHANES]    Frequency of Communication with Friends and Family: More than three times a week    Frequency of Social Gatherings with Friends and Family: Not on file    Attends Religious Services: Not on file    Active Member of Clubs or Organizations: Not on file    Attends Banker Meetings: Not on file    Marital Status: Not on file     Family History:  The patient's family history includes Atrial fibrillation in his mother; Heart disease in his father; Stroke in his father.  ROS:   12-point review of systems is negative unless otherwise noted in the HPI.   EKGs/Labs/Other Studies Reviewed:    Studies reviewed were summarized above. The additional studies were reviewed today:  2D echo 05/31/2023: 1. Left ventricular ejection fraction, by estimation, is 55%. Left  ventricular ejection fraction by PLAX is 55 %. The left ventricle has  normal function. The left ventricle has no regional wall motion  abnormalities. Left ventricular diastolic parameters   were normal.   2. Right ventricular systolic function is normal. The right ventricular  size is normal.   3. The mitral valve is normal in structure. No evidence of mitral valve  regurgitation.   4. The aortic valve is tricuspid. Aortic valve regurgitation is mild to  moderate.   5. Aortic dilatation noted. There is borderline dilatation of the  ascending aorta, measuring 38 mm.   6. The inferior vena cava is normal in size with greater than 50%  respiratory variability, suggesting right atrial pressure of 3 mmHg.  __________  LHC 05/05/2023:   Mid RCA lesion is 30% stenosed.   2nd Diag lesion is 30% stenosed.   Prox Cx to Mid Cx lesion is 30% stenosed.   Mid LAD lesion is 10% stenosed.   Non-stenotic Prox LAD to Mid LAD lesion was previously treated.   The left ventricular systolic function is normal.   LV end diastolic pressure  is mildly elevated.   The left ventricular ejection fraction is  55-65% by visual estimate.   1.  Widely patent LAD stents with minimal restenosis.  Stable mild to moderate left circumflex and RCA disease. 2.  Normal LV systolic function with mildly elevated left ventricular end-diastolic pressure at 20 mmHg.   Recommendations: Continue aggressive medical therapy. __________  Eugenie Birks MPI 03/25/2022:   . The study is low risk.   There is no evidence for ischemia   Left ventricular function is normal. LVEF = 56%   LAD and RCA calcifications noted. __________   2D Echo 04/2018: 1. The left ventricle has normal systolic function, with an ejection fraction of 55-60%. The cavity size was normal. There is mild concentric left ventricular hypertrophy. Left ventricular diastolic parameters were normal.  2. The right ventricle has normal systolic function. The cavity was normal. There is no increase in right ventricular wall thickness.  3. The mitral valve is grossly normal.  4. The tricuspid valve is grossly normal.  5. The aortic valve has an indeterminate number of cusps. Aortic valve regurgitation is mild by color flow Doppler. __________   CTA chest/abd/pelvis: 1. No dissection.  No PE.  No aortic aneurysm. 2. Atherosclerotic changes are noted throughout the thoracic and abdominal aorta. 3. No acute intra-abdominal abnormality. 4. Severe sigmoid diverticulosis without CT evidence of diverticulitis. 5. Enlarged prostate gland. Bilateral fat containing inguinal hernias are noted.   Zio 05/2019: Normal sinus rhythm with an average heart rate of 73 bpm. 27 episodes of short SVTs.  The longest lasted 13 beats. __________   Coronary CTA/FFR: 1. Left Main: 0.98. 2. LAD: Ostial: 0.98, proximal: 0.50. 3. LCX: Proximal: 0.95, mid: 078. 4. OM1: 0.77. 5. RCA: Proximal: 0.98, mid: 0.82, PDA: 0.79.   IMPRESSION: 1. CT FFR analysis showed severe stenosis in the proximal LAD, mid RCA, mid LCX artery. A cardiac catheterization is recommended. __________    [facility] 06/2019: Conclusions: Severe single-vessel CAD with 80-90% proximal LAD stenosis with heavy calcification. Mild to moderate, non-obstructive CAD involving mid LAD, mid LCx, and mid RCA. Elevated LVEDP with normal left ventricular contraction consistent with diastolic dysfunction.   Recommendations: Plan for staged PCI to proximal LAD at [facility] using atherectomy.  Will start patient on dual antiplatelet therapy with aspirin and clopidogrel.  Long-term, transitioning Mr. Ostermann to NOAC + clopidogrel will need to be considered with his history of paroxysmal atrial fibrillation and CHADSVASC score of at least 2 (age + CAD). Aggressive secondary prevention. __________   PCI 06/2019: Mid LAD lesion is 55% stenosed. Prox Cx to Mid Cx lesion is 40% stenosed. Mid RCA lesion is 30% stenosed. Prox LAD to Mid LAD lesion is 85% stenosed. 2nd Diag lesion is 50% stenosed. Post intervention, there is a 0% residual stenosis. A drug-eluting stent was successfully placed using a STENT RESOLUTE ZHYQ6.5H84. Post intervention, there is a 0% residual stenosis. A drug-eluting stent was successfully placed using a STENT RESOLUTE ONYX 3.0X8. Post intervention, there is a 30% residual stenosis. Balloon angioplasty was performed using a BALLOON SAPPHIRE 2.5X20.   Successful complex orbital atherectomy and 2 overlapped drug-eluting stent placement to the mid and proximal LAD extending into the ostium with balloon angioplasty of the ostial second diagonal.  There was non-flow-limiting dissection in ostial diagonal with normal flow.  This did not require treatment.   Recommendations: Dual antiplatelet therapy for at least 1 year.  Aggressive treatment of risk factors.   EKG:  EKG is not ordered today.  Recent Labs: 04/23/2023: ALT 36 04/30/2023: BUN 23; Creatinine, Ser 0.79; Hemoglobin 14.4; Platelets 170; Potassium 3.7; Sodium 139  Recent Lipid Panel    Component Value Date/Time   CHOL 132  04/23/2023 0857   CHOL 135 08/28/2020 0925   TRIG 83 04/23/2023 0857   HDL 46 04/23/2023 0857   HDL 46 08/28/2020 0925   CHOLHDL 2.9 04/23/2023 0857   VLDL 17 04/23/2023 0857   LDLCALC 69 04/23/2023 0857   LDLCALC 71 08/28/2020 0925    PHYSICAL EXAM:    VS:  BP 124/60 (BP Location: Left Arm, Patient Position: Sitting, Cuff Size: Normal)   Pulse 67   Ht 6' (1.829 m)   Wt 213 lb (96.6 kg)   SpO2 98%   BMI 28.89 kg/m   BMI: Body mass index is 28.89 kg/m.  Physical Exam Vitals reviewed.  Constitutional:      Appearance: He is well-developed.  HENT:     Head: Normocephalic and atraumatic.  Eyes:     General:        Right eye: No discharge.        Left eye: No discharge.  Neck:     Vascular: No JVD.  Cardiovascular:     Rate and Rhythm: Normal rate and regular rhythm.     Heart sounds: Normal heart sounds, S1 normal and S2 normal. Heart sounds not distant. No midsystolic click and no opening snap. No murmur heard.    No friction rub.     Comments: Right radial arteriotomy site is well-healed without active bleeding, bruising, swelling, warmth, erythema, or tenderness to palpation.  Radial pulse 2+ proximal and distal to the arteriotomy site. Pulmonary:     Effort: Pulmonary effort is normal. No respiratory distress.     Breath sounds: Normal breath sounds. No decreased breath sounds, wheezing or rales.  Chest:     Chest wall: No tenderness.  Abdominal:     General: There is no distension.  Musculoskeletal:     Cervical back: Normal range of motion.  Skin:    General: Skin is warm and dry.     Nails: There is no clubbing.  Neurological:     Mental Status: He is alert and oriented to person, place, and time.  Psychiatric:        Speech: Speech normal.        Behavior: Behavior normal.        Thought Content: Thought content normal.        Judgment: Judgment normal.     Wt Readings from Last 3 Encounters:  06/28/23 213 lb (96.6 kg)  05/05/23 206 lb (93.4 kg)   04/30/23 212 lb (96.2 kg)     ASSESSMENT & PLAN:   CAD involving the native coronary arteries with stable angina: No further symptoms concerning for anginal equivalent.  No chest pain or symptoms of cardiac decompensation.  Recent LHC showed patent LAD stents with stable disease involving the LCx and RCA with medical therapy recommended.  Echo demonstrated preserved LV systolic function.  Continue aggressive risk factor modification and secondary prevention including aspirin, clopidogrel, diltiazem, ezetimibe, and rosuvastatin.  PAF/PSVT: Quiescent.  He remains on Cardizem CD 120 mg twice daily.  Aortic insufficiency with borderline dilatation of the ascending aorta: Echo last month showed mild to moderate aortic valve insufficiency with borderline dilatation of the ascending aorta measuring 38 mm.  Repeat echo in 12 months.  If there is significant progression of ascending aortic dilatation consider cross-sectional imaging.  Diastolic dysfunction:  Euvolemic and well compensated.  Not requiring a standing loop diuretic.  HLD with familial hyperlipidemia: LDL 69 in 04/2023.  He remains on rosuvastatin 40 mg and ezetimibe.   Disposition: F/u with Dr. Kirke Corin or an APP in 6 months.   Medication Adjustments/Labs and Tests Ordered: Current medicines are reviewed at length with the patient today.  Concerns regarding medicines are outlined above. Medication changes, Labs and Tests ordered today are summarized above and listed in the Patient Instructions accessible in Encounters.   Signed, Eula Listen, PA-C 06/28/2023 2:40 PM     [facility] [place] [address] [place], [place] [address] [phone]

## 2023-06-28 NOTE — Patient Instructions (Signed)
Medication Instructions:  No changes at this time.   *If you need a refill on your cardiac medications before your next appointment, please call your pharmacy*   Lab Work: None  If you have labs (blood work) drawn today and your tests are completely normal, you will receive your results only by: MyChart Message (if you have MyChart) OR A paper copy in the mail If you have any lab test that is abnormal or we need to change your treatment, we will call you to review the results.   Testing/Procedures: None   Follow-Up: At [facility], you and your health needs are our priority.  As part of our continuing mission to provide you with exceptional heart care, we have created designated Provider Care Teams.  These Care Teams include your primary Cardiologist (physician) and Advanced Practice Providers (APPs -  Physician Assistants and Nurse Practitioners) who all work together to provide you with the care you need, when you need it.   Your next appointment:   6 month(s)  Provider:   Muhammad Arida, MD or Ryan Dunn, PA-C     

## 2023-07-10 ENCOUNTER — Other Ambulatory Visit: Payer: Self-pay | Admitting: Family Medicine

## 2023-07-10 ENCOUNTER — Other Ambulatory Visit: Payer: Self-pay | Admitting: Physician Assistant

## 2023-07-10 ENCOUNTER — Other Ambulatory Visit: Payer: Self-pay | Admitting: Cardiovascular Disease

## 2023-07-10 DIAGNOSIS — R079 Chest pain, unspecified: Secondary | ICD-10-CM

## 2023-07-10 DIAGNOSIS — E039 Hypothyroidism, unspecified: Secondary | ICD-10-CM

## 2023-07-12 NOTE — Telephone Encounter (Signed)
Requested Prescriptions  Pending Prescriptions Disp Refills   levothyroxine (SYNTHROID) 25 MCG tablet [Pharmacy Med Name: Levothyroxine Sodium 25 MCG Oral Tablet] 90 tablet 0    Sig: TAKE 1 TABLET BY MOUTH BEFORE BREAKFAST     Endocrinology:  Hypothyroid Agents Failed - 07/10/2023  9:08 AM      Failed - TSH in normal range and within 360 days    TSH  Date Value Ref Range Status  03/25/2022 2.341 0.350 - 4.500 uIU/mL Final    Comment:    Performed by a 3rd Generation assay with a functional sensitivity of <=0.01 uIU/mL. Performed at [facility], [address] [place], [place] [address]   08/28/2020 3.580 0.450 - 4.500 uIU/mL Final         Passed - Valid encounter within last 12 months    Recent Outpatient Visits           3 months ago Encounter for subsequent annual wellness visit (AWV) in Medicare patient   [facility] Merita Norton T, FNP   11 months ago Herpes zoster without complication   [facility] Caro Laroche, DO   1 year ago COVID-19   [facility] Merita Norton T, FNP   1 year ago Atherosclerotic heart disease of native coronary artery with other forms of angina pectoris [facility])   [facility] Jacky Kindle, FNP   2 years ago Viral URI   [facility] Bosie Clos, MD

## 2023-08-02 ENCOUNTER — Telehealth: Payer: Self-pay | Admitting: Cardiovascular Disease

## 2023-08-02 DIAGNOSIS — Z7902 Long term (current) use of antithrombotics/antiplatelets: Secondary | ICD-10-CM | POA: Diagnosis not present

## 2023-08-02 DIAGNOSIS — Z01818 Encounter for other preprocedural examination: Secondary | ICD-10-CM | POA: Diagnosis not present

## 2023-08-02 DIAGNOSIS — Z1211 Encounter for screening for malignant neoplasm of colon: Secondary | ICD-10-CM | POA: Diagnosis not present

## 2023-08-02 NOTE — Telephone Encounter (Signed)
[name] [name],  You saw this patient on 06/28/2023. Will you please comment on medical clearance for colonoscopy?  Please route your response to P CV DIV Preop. I will communicate with requesting office once you have given recommendations.   Thank you!  [name], NP

## 2023-08-02 NOTE — Telephone Encounter (Signed)
   Pre-operative Risk Assessment    Patient Name: [name] [name]  DOB: Mar 29, 1951 MRN: [id number]     Request for Surgical Clearance    Procedure:   colonoscopy  Date of Surgery:  Clearance 10/29/23                                 Surgeon: Surgeon's Group or Practice Name:   [facility] gastroenterology Phone number:  [phone] Fax number:  [phone]   Type of Clearance Requested:   - Pharmacy:  Hold Clopidogrel (Plavix) instrucations   Type of Anesthesia:  General    Additional requests/questions:    Burnett Sheng   08/02/2023, 3:32 PM

## 2023-08-02 NOTE — Telephone Encounter (Signed)
Okay to hold Plavix for 5 days but should continue aspirin 81 mg once daily without interruption.

## 2023-08-02 NOTE — Telephone Encounter (Signed)
Dr. Kirke Corin,  Patient underwent PCI with overlapping DES x 2 (stents measure 3.0 x 38 and 3.0 x 8) to mid and proximal LAD July 2020. Repeat LHC May 2024 showed patent stents with minimal restenosis. Per protocol, will you please provide recommendations for stopping Plavix prior to colonoscopy?   Please route your response to P CV DIV Preop. I will communicate with requesting office once you have given recommendations.   Thank you!  [name] Levering, NP

## 2023-08-02 NOTE — Telephone Encounter (Signed)
   Name: [name] [name]  DOB: 20-Sep-1951  MRN: [id number]   Primary Cardiologist: Lorine Bears, MD  Chart reviewed as part of pre-operative protocol coverage. [name] was last seen on 06/28/2023 by Eula Listen, PA-C.  Per [name]: "Patient is class I risk for noncardiac procedure, colonoscopy, with an estimated rate of 3.9% for adverse cardiac event in the peri-procedure timeframe. Per interventional cardiology, he may hold Plavix x 5 days prior with recommendation to continue aspirin 81 mg daily without interruption. No further cardiac testing is indicated at this time." Dr. Kirke Corin in agreement to hold Plavix x 5 days and continue aspirin 81 mg daily throughout perioperative period.    I will route this recommendation to the requesting party via Epic fax function and remove from pre-op pool. Please call with questions.  [name], NP 08/02/2023, 5:06 PM

## 2023-08-02 NOTE — Telephone Encounter (Signed)
Patient is class I risk for noncardiac procedure, colonoscopy, with an estimated rate of 3.9% for adverse cardiac event in the peri-procedure timeframe. Per interventional cardiology, he may hold Plavix x 5 days prior with recommendation to continue aspirin 81 mg daily without interruption. No further cardiac testing is indicated at this time.

## 2023-08-10 ENCOUNTER — Other Ambulatory Visit: Payer: Self-pay | Admitting: Physician Assistant

## 2023-08-19 ENCOUNTER — Encounter: Payer: Medicare Other | Admitting: Dermatology

## 2023-09-16 ENCOUNTER — Encounter: Payer: Medicare Other | Admitting: Dermatology

## 2023-10-04 ENCOUNTER — Ambulatory Visit: Payer: Medicare Other | Admitting: Dermatology

## 2023-10-04 DIAGNOSIS — Z1283 Encounter for screening for malignant neoplasm of skin: Secondary | ICD-10-CM

## 2023-10-04 DIAGNOSIS — L570 Actinic keratosis: Secondary | ICD-10-CM | POA: Diagnosis not present

## 2023-10-04 DIAGNOSIS — W908XXA Exposure to other nonionizing radiation, initial encounter: Secondary | ICD-10-CM | POA: Diagnosis not present

## 2023-10-04 DIAGNOSIS — H35341 Macular cyst, hole, or pseudohole, right eye: Secondary | ICD-10-CM | POA: Diagnosis not present

## 2023-10-04 DIAGNOSIS — L820 Inflamed seborrheic keratosis: Secondary | ICD-10-CM

## 2023-10-04 DIAGNOSIS — D1801 Hemangioma of skin and subcutaneous tissue: Secondary | ICD-10-CM

## 2023-10-04 DIAGNOSIS — D229 Melanocytic nevi, unspecified: Secondary | ICD-10-CM

## 2023-10-04 DIAGNOSIS — L578 Other skin changes due to chronic exposure to nonionizing radiation: Secondary | ICD-10-CM

## 2023-10-04 DIAGNOSIS — H2513 Age-related nuclear cataract, bilateral: Secondary | ICD-10-CM | POA: Diagnosis not present

## 2023-10-04 DIAGNOSIS — L814 Other melanin hyperpigmentation: Secondary | ICD-10-CM

## 2023-10-04 DIAGNOSIS — Z85828 Personal history of other malignant neoplasm of skin: Secondary | ICD-10-CM

## 2023-10-04 DIAGNOSIS — H43812 Vitreous degeneration, left eye: Secondary | ICD-10-CM | POA: Diagnosis not present

## 2023-10-04 DIAGNOSIS — L821 Other seborrheic keratosis: Secondary | ICD-10-CM

## 2023-10-04 DIAGNOSIS — Z85820 Personal history of malignant melanoma of skin: Secondary | ICD-10-CM

## 2023-10-04 DIAGNOSIS — Z8669 Personal history of other diseases of the nervous system and sense organs: Secondary | ICD-10-CM | POA: Diagnosis not present

## 2023-10-04 DIAGNOSIS — L918 Other hypertrophic disorders of the skin: Secondary | ICD-10-CM

## 2023-10-04 NOTE — Patient Instructions (Signed)

## 2023-10-04 NOTE — Progress Notes (Signed)
Follow-Up Visit   Subjective  [name] [name] is a 72 y.o. male who presents for the following: Skin Cancer Screening and Full Body Skin Exam  The patient presents for Total-Body Skin Exam (TBSE) for skin cancer screening and mole check. The patient has spots, moles and lesions to be evaluated, some may be new or changing and the patient may have concern these could be cancer.   The following portions of the chart were reviewed this encounter and updated as appropriate: medications, allergies, medical history  Review of Systems:  No other skin or systemic complaints except as noted in HPI or Assessment and Plan.  Objective  Well appearing patient in no apparent distress; mood and affect are within normal limits.  A full examination was performed including scalp, head, eyes, ears, nose, lips, neck, chest, axillae, abdomen, back, buttocks, bilateral upper extremities, bilateral lower extremities, hands, feet, fingers, toes, fingernails, and toenails. All findings within normal limits unless otherwise noted below.   Relevant physical exam findings are noted in the Assessment and Plan.  Face and ears x 6 (6) Erythematous thin papules/macules with gritty scale.   L med knee x 3 (3) Erythematous stuck-on, waxy papule or plaque    Assessment & Plan   SKIN CANCER SCREENING PERFORMED TODAY.  ACTINIC DAMAGE - Chronic condition, secondary to cumulative UV/sun exposure - diffuse scaly erythematous macules with underlying dyspigmentation - Recommend daily broad spectrum sunscreen SPF 30+ to sun-exposed areas, reapply every 2 hours as needed.  - Staying in the shade or wearing long sleeves, sun glasses (UVA+UVB protection) and wide brim hats (4-inch brim around the entire circumference of the hat) are also recommended for sun protection.  - Call for new or changing lesions.  LENTIGINES, SEBORRHEIC KERATOSES, HEMANGIOMAS - Benign normal skin lesions - Benign-appearing - Call for any  changes  MELANOCYTIC NEVI - Tan-brown and/or pink-flesh-colored symmetric macules and papules - Benign appearing on exam today - Observation - Call clinic for new or changing moles - Recommend daily use of broad spectrum spf 30+ sunscreen to sun-exposed areas.   HISTORY OF BASAL CELL CARCINOMA OF THE SKIN - No evidence of recurrence today - Recommend regular full body skin exams - Recommend daily broad spectrum sunscreen SPF 30+ to sun-exposed areas, reapply every 2 hours as needed.  - Call if any new or changing lesions are noted between office visits  HISTORY OF MELANOMA - No evidence of recurrence today - No lymphadenopathy - Recommend regular full body skin exams - Recommend daily broad spectrum sunscreen SPF 30+ to sun-exposed areas, reapply every 2 hours as needed.  - Call if any new or changing lesions are noted between office visits  AK (actinic keratosis) (6) Face and ears x 6  Actinic keratoses are precancerous spots that appear secondary to cumulative UV radiation exposure/sun exposure over time. They are chronic with expected duration over 1 year. A portion of actinic keratoses will progress to squamous cell carcinoma of the skin. It is not possible to reliably predict which spots will progress to skin cancer and so treatment is recommended to prevent development of skin cancer.  Recommend daily broad spectrum sunscreen SPF 30+ to sun-exposed areas, reapply every 2 hours as needed.  Recommend staying in the shade or wearing long sleeves, sun glasses (UVA+UVB protection) and wide brim hats (4-inch brim around the entire circumference of the hat). Call for new or changing lesions.   Destruction of lesion - Face and ears x 6 (6) Complexity: simple  Destruction method: cryotherapy   Informed consent: discussed and consent obtained   Timeout:  patient name, date of birth, surgical site, and procedure verified Lesion destroyed using liquid nitrogen: Yes   Region frozen  until ice ball extended beyond lesion: Yes   Outcome: patient tolerated procedure well with no complications   Post-procedure details: wound care instructions given    Inflamed seborrheic keratosis (3) L med knee x 3  Destruction of lesion - L med knee x 3 (3) Complexity: simple   Destruction method: cryotherapy   Informed consent: discussed and consent obtained   Timeout:  patient name, date of birth, surgical site, and procedure verified Lesion destroyed using liquid nitrogen: Yes   Region frozen until ice ball extended beyond lesion: Yes   Outcome: patient tolerated procedure well with no complications   Post-procedure details: wound care instructions given     Acrochordons (Skin Tags) - Fleshy, skin-colored pedunculated papules - Benign appearing.  - Observe. - If desired, they can be removed with an in office procedure that is not covered by insurance. - Please call the clinic if you notice any new or changing lesions.  Return in about 6 months (around 04/03/2024) for TBSE.  Maylene Roes, CMA, am acting as scribe for Armida Sans, MD .   Documentation: I have reviewed the above documentation for accuracy and completeness, and I agree with the above.  Armida Sans, MD

## 2023-10-08 ENCOUNTER — Other Ambulatory Visit: Payer: Self-pay | Admitting: Family Medicine

## 2023-10-08 ENCOUNTER — Other Ambulatory Visit: Payer: Self-pay | Admitting: Physician Assistant

## 2023-10-08 DIAGNOSIS — E039 Hypothyroidism, unspecified: Secondary | ICD-10-CM

## 2023-10-08 DIAGNOSIS — R079 Chest pain, unspecified: Secondary | ICD-10-CM

## 2023-10-16 ENCOUNTER — Encounter: Payer: Self-pay | Admitting: Dermatology

## 2023-10-29 ENCOUNTER — Encounter: Payer: Self-pay | Admitting: *Deleted

## 2023-10-29 ENCOUNTER — Other Ambulatory Visit: Payer: Self-pay

## 2023-10-29 ENCOUNTER — Ambulatory Visit
Admission: RE | Admit: 2023-10-29 | Discharge: 2023-10-29 | Disposition: A | Discharge status: Home / Self Care | Payer: Medicare Other | Attending: Gastroenterology | Admitting: Gastroenterology

## 2023-10-29 ENCOUNTER — Encounter
Admission: RE | Disposition: A | Discharge status: Home / Self Care | Payer: Self-pay | Source: Home / Self Care | Attending: Gastroenterology

## 2023-10-29 ENCOUNTER — Ambulatory Visit: Payer: Medicare Other

## 2023-10-29 DIAGNOSIS — I2510 Atherosclerotic heart disease of native coronary artery without angina pectoris: Secondary | ICD-10-CM | POA: Insufficient documentation

## 2023-10-29 DIAGNOSIS — Z79890 Hormone replacement therapy (postmenopausal): Secondary | ICD-10-CM | POA: Diagnosis not present

## 2023-10-29 DIAGNOSIS — D120 Benign neoplasm of cecum: Secondary | ICD-10-CM | POA: Insufficient documentation

## 2023-10-29 DIAGNOSIS — Z7902 Long term (current) use of antithrombotics/antiplatelets: Secondary | ICD-10-CM | POA: Insufficient documentation

## 2023-10-29 DIAGNOSIS — K635 Polyp of colon: Secondary | ICD-10-CM | POA: Diagnosis not present

## 2023-10-29 DIAGNOSIS — E038 Other specified hypothyroidism: Secondary | ICD-10-CM | POA: Insufficient documentation

## 2023-10-29 DIAGNOSIS — I10 Essential (primary) hypertension: Secondary | ICD-10-CM | POA: Diagnosis not present

## 2023-10-29 DIAGNOSIS — I480 Paroxysmal atrial fibrillation: Secondary | ICD-10-CM | POA: Diagnosis not present

## 2023-10-29 DIAGNOSIS — K640 First degree hemorrhoids: Secondary | ICD-10-CM | POA: Insufficient documentation

## 2023-10-29 DIAGNOSIS — Z1211 Encounter for screening for malignant neoplasm of colon: Secondary | ICD-10-CM | POA: Insufficient documentation

## 2023-10-29 DIAGNOSIS — Z955 Presence of coronary angioplasty implant and graft: Secondary | ICD-10-CM | POA: Diagnosis not present

## 2023-10-29 DIAGNOSIS — K219 Gastro-esophageal reflux disease without esophagitis: Secondary | ICD-10-CM | POA: Insufficient documentation

## 2023-10-29 DIAGNOSIS — K5730 Diverticulosis of large intestine without perforation or abscess without bleeding: Secondary | ICD-10-CM | POA: Insufficient documentation

## 2023-10-29 DIAGNOSIS — D124 Benign neoplasm of descending colon: Secondary | ICD-10-CM | POA: Diagnosis not present

## 2023-10-29 DIAGNOSIS — Z85828 Personal history of other malignant neoplasm of skin: Secondary | ICD-10-CM | POA: Diagnosis not present

## 2023-10-29 DIAGNOSIS — D122 Benign neoplasm of ascending colon: Secondary | ICD-10-CM | POA: Diagnosis not present

## 2023-10-29 HISTORY — PX: HEMOSTASIS CLIP PLACEMENT: SHX6857

## 2023-10-29 HISTORY — PX: COLONOSCOPY WITH PROPOFOL: SHX5780

## 2023-10-29 HISTORY — PX: POLYPECTOMY: SHX5525

## 2023-10-29 SURGERY — COLONOSCOPY WITH PROPOFOL
Anesthesia: General

## 2023-10-29 MED ORDER — SODIUM CHLORIDE 0.9 % IV SOLN: INTRAVENOUS | Status: DC

## 2023-10-29 MED ORDER — LIDOCAINE HCL (CARDIAC) PF 100 MG/5ML IV SOSY
PREFILLED_SYRINGE | INTRAVENOUS | Status: DC | PRN
  Administered 2023-10-29: 50 mg via INTRAVENOUS

## 2023-10-29 MED ORDER — PROPOFOL 10 MG/ML IV BOLUS
INTRAVENOUS | Status: DC | PRN
  Administered 2023-10-29: 80 mg via INTRAVENOUS
  Administered 2023-10-29: 40 mg via INTRAVENOUS

## 2023-10-29 MED ORDER — PROPOFOL 500 MG/50ML IV EMUL
INTRAVENOUS | Status: DC | PRN
  Administered 2023-10-29: 100 ug/kg/min via INTRAVENOUS

## 2023-10-29 NOTE — H&P (Signed)
Outpatient short stay form Pre-procedure 10/29/2023  [name] Bill, MD  Primary Physician: Jacky Kindle, FNP  Reason for visit:  Screening  History of present illness:    72 y/o gentleman with history of CAD on plavix (last dose 7 days ago), hypothyroidism, and HLD here for screening colonoscopy. Last colonoscopy was 16 years ago in [place]. No family history of GI malignancies. History of two inguinal hernia surgeries and an umbilical hernia repair.    Current Facility-Administered Medications:    0.9 %  sodium chloride infusion, , Intravenous, Continuous, Anays Detore, Rossie Muskrat, MD, Last Rate: 20 mL/hr at 10/29/23 1149, New Bag at 10/29/23 1149  Medications Prior to Admission  Medication Sig Dispense Refill Last Dose   aspirin EC 81 MG tablet Take 81 mg by mouth in the morning.   10/28/2023   diltiazem (CARDIZEM CD) 120 MG 24 hr capsule TAKE 1 CAPSULE BY MOUTH IN THE MORNING AND AT BEDTIME 180 capsule 0 10/29/2023   ezetimibe (ZETIA) 10 MG tablet TAKE 1 TABLET BY MOUTH ONCE DAILY IN THE EVENING 90 tablet 0 10/28/2023   famotidine (PEPCID) 20 MG tablet Take 20 mg by mouth daily before supper.   Past Week   fluticasone (FLONASE) 50 MCG/ACT nasal spray Place 1 spray into both nostrils in the morning.   Past Week   levothyroxine (SYNTHROID) 25 MCG tablet TAKE 1 TABLET BY MOUTH BEFORE BREAKFAST 30 tablet 0 10/29/2023   Misc Natural Products (OSTEO BI-FLEX TRIPLE STRENGTH PO) Take 2 tablets by mouth in the morning.   Past Week   Multiple Vitamin (MULTIVITAMIN WITH MINERALS) TABS tablet Take 1 tablet by mouth in the morning.   Past Week   Nutritional Supplements (FRUIT & VEGETABLE DAILY) CAPS Take 6 capsules by mouth in the morning. Balance of Nature Fruits (3) & Veggies (3)   Past Week   rosuvastatin (CRESTOR) 40 MG tablet Take 1 tablet by mouth once daily 90 tablet 0 10/28/2023   Turmeric 500 MG CAPS Take 500 mg by mouth in the morning.   Past Week   VITAMIN D, CHOLECALCIFEROL, PO Take 1  tablet by mouth in the morning.   Past Week   acetaminophen (TYLENOL) 500 MG tablet Take 500-1,000 mg by mouth every 6 (six) hours as needed (pain.).      cetirizine (ZYRTEC) 10 MG tablet Take 10 mg by mouth at bedtime.       clopidogrel (PLAVIX) 75 MG tablet Take 1 tablet (75 mg total) by mouth daily. 90 tablet 3 10/22/2023   nitroGLYCERIN (NITROSTAT) 0.4 MG SL tablet Place 1 tablet (0.4 mg total) under the tongue every 5 (five) minutes as needed for chest pain. 25 tablet 3    sildenafil (VIAGRA) 100 MG tablet Take 1 tablet (100 mg total) by mouth daily as needed for erectile dysfunction. Take two hours prior to intercourse on an empty stomach 30 tablet 3      Allergies  Allergen Reactions   Hydrocodone-Acetaminophen Nausea And Vomiting and Other (See Comments)   Vicodin [Hydrocodone-Acetaminophen] Nausea And Vomiting     Past Medical History:  Diagnosis Date   Acquired hypothyroidism    Aortic atherosclerosis (HCC)    Basal cell carcinoma 08/13/2022   Left dorsal forearm. Superficial and nodular. Tx with [facility] 09/17/22   Basal cell carcinoma 03/24/2023   Left medial calf. Superficial BCC with focal infiltration. [facility] 04/29/23   Coronary artery disease    a.) LHC 06/26/2019: EF 55-60%; LVEDP 25-30%; 80 pLAD, 40% mLAD, 40%  p-mLCx, 30% mRCA; staged PCI planned. b.) LHC/staged PCI 07/05/2019: 55% mLAD, 40% p-mLCx, 30% mRCA, 85% p-mLAD, 50% D2 --> orbital atherectomy and PCI performed placing overlapping 3.0 x 8 mm (pLAD) and 3.0 x 30 mm (mLAD) Resolute Onyx DES x 2   Diverticulosis    ED (erectile dysfunction)    a.) on PDE5i (tadalifil)   Elevated LFTs    GERD (gastroesophageal reflux disease)    HLD (hyperlipidemia)    Hypertension    Long term current use of antithrombotics/antiplatelets    a.) daily DAPT therapy (ASA + clopidogrel)   Melanoma (HCC)    Left shoulder. 1984   PAF (paroxysmal atrial fibrillation) (HCC) 2004   a.) CHA2DS2-VASc = 3 (age, HTN, aortic plaque). b.)  rate/rhythm maintained on oral diltiazem; on daily DAPT (ASA + clopidogrel)   Paroxysmal SVT (supraventricular tachycardia) (HCC)    Pure hypercholesterolemia    Skin cancer    Left forearm. NMSC. Removed 2008 or prior    Review of systems:  Otherwise negative.    Physical Exam  Gen: Alert, oriented. Appears stated age.  HEENT: PERRLA. Lungs: No respiratory distress CV: RRR Abd: soft, benign, no masses Ext: No edema    Planned procedures: Proceed with colonoscopy. The patient understands the nature of the planned procedure, indications, risks, alternatives and potential complications including but not limited to bleeding, infection, perforation, damage to internal organs and possible oversedation/side effects from anesthesia. The patient agrees and gives consent to proceed.  Please refer to procedure notes for findings, recommendations and patient disposition/instructions.     [name] Bill, MD [facility] Gastroenterology

## 2023-10-29 NOTE — Op Note (Signed)
[facility] Gastroenterology Patient Name: [name] [name] Procedure Date: 10/29/2023 12:13 PM MRN: [id number] Account #: [id number] Date of Birth: 1951-04-29 Admit Type: Outpatient Age: 72 Room: [facility] ENDO ROOM 3 Gender: Male Note Status: Finalized Instrument Name: Prentice Docker [id number] Procedure:             Colonoscopy Indications:           Screening for colorectal malignant neoplasm Providers:             Eather Colas MD, MD Referring MD:          Eather Colas MD, MD (Referring MD), Daryl Eastern. Suzie Portela                         (Referring MD) Medicines:             Monitored Anesthesia Care Complications:         No immediate complications. Estimated blood loss:                         Minimal. Procedure:             Pre-Anesthesia Assessment:                        - Prior to the procedure, a History and Physical was                         performed, and patient medications and allergies were                         reviewed. The patient is competent. The risks and                         benefits of the procedure and the sedation options and                         risks were discussed with the patient. All questions                         were answered and informed consent was obtained.                         Patient identification and proposed procedure were                         verified by the physician, the nurse, the                         anesthesiologist, the anesthetist and the technician                         in the endoscopy suite. Mental Status Examination:                         alert and oriented. Airway Examination: normal                         oropharyngeal airway and neck mobility. Respiratory  Examination: clear to auscultation. CV Examination:                         normal. Prophylactic Antibiotics: The patient does not                         require prophylactic antibiotics. Prior                          Anticoagulants: The patient has taken Plavix                         (clopidogrel), last dose was 7 days prior to                         procedure. ASA Grade Assessment: III - A patient with                         severe systemic disease. After reviewing the risks and                         benefits, the patient was deemed in satisfactory                         condition to undergo the procedure. The anesthesia                         plan was to use monitored anesthesia care (MAC).                         Immediately prior to administration of medications,                         the patient was re-assessed for adequacy to receive                         sedatives. The heart rate, respiratory rate, oxygen                         saturations, blood pressure, adequacy of pulmonary                         ventilation, and response to care were monitored                         throughout the procedure. The physical status of the                         patient was re-assessed after the procedure.                        After obtaining informed consent, the colonoscope was                         passed under direct vision. Throughout the procedure,                         the patient's blood pressure, pulse, and oxygen  saturations were monitored continuously. The                         Colonoscope was introduced through the anus and                         advanced to the the cecum, identified by appendiceal                         orifice and ileocecal valve. The colonoscopy was                         performed without difficulty. The patient tolerated                         the procedure well. The quality of the bowel                         preparation was good. The ileocecal valve, appendiceal                         orifice, and rectum were photographed. Findings:      The perianal and digital rectal examinations were normal.      A 2 mm polyp was found in  the cecum. The polyp was sessile. The polyp       was removed with a jumbo cold forceps. Resection and retrieval were       complete. Estimated blood loss was minimal.      A 3 mm polyp was found in the cecum. The polyp was sessile. The polyp       was removed with a cold snare. Resection and retrieval were complete.       Estimated blood loss was minimal.      A 13 mm polyp was found in the ascending colon. The polyp was sessile.       The polyp was removed with a hot snare. Resection and retrieval were       complete. To prevent bleeding after the polypectomy, one hemostatic clip       was successfully placed. There was no bleeding during, or at the end, of       the procedure.      A 4 mm polyp was found in the descending colon. The polyp was       pedunculated. The polyp was removed with a cold snare. Resection and       retrieval were complete.      Multiple small-mouthed diverticula were found in the sigmoid colon and       descending colon.      Internal hemorrhoids were found during retroflexion. The hemorrhoids       were Grade I (internal hemorrhoids that do not prolapse).      The exam was otherwise without abnormality on direct and retroflexion       views. Impression:            - One 2 mm polyp in the cecum, removed with a jumbo                         cold forceps. Resected and retrieved.                        -  One 3 mm polyp in the cecum, removed with a cold                         snare. Resected and retrieved.                        - One 13 mm polyp in the ascending colon, removed with                         a hot snare. Resected and retrieved. Clip was placed.                        - One 4 mm polyp in the descending colon, removed with                         a cold snare. Resected and retrieved.                        - Diverticulosis in the sigmoid colon and in the                         descending colon.                        - Internal hemorrhoids.                         - The examination was otherwise normal on direct and                         retroflexion views. Recommendation:        - Discharge patient to home.                        - Resume previous diet.                        - Continue present medications.                        - Await pathology results.                        - Repeat colonoscopy in 3 years for surveillance.                        - Resume Plavix (clopidogrel) at prior dose in 2 days. Procedure Code(s):     --- Professional ---                        [phone], Colonoscopy, flexible; with removal of                         tumor(s), polyp(s), or other lesion(s) by snare                         technique                        45380, 59, Colonoscopy, flexible; with biopsy, single  or multiple Diagnosis Code(s):     --- Professional ---                        Z12.11, Encounter for screening for malignant neoplasm                         of colon                        D12.0, Benign neoplasm of cecum                        D12.2, Benign neoplasm of ascending colon                        D12.4, Benign neoplasm of descending colon                        K64.0, First degree hemorrhoids                        K57.30, Diverticulosis of large intestine without                         perforation or abscess without bleeding CPT copyright 2022 American [employer] Association. All rights reserved. The codes documented in this report are preliminary and upon coder review may  be revised to meet current compliance requirements. Eather Colas MD, MD 10/29/2023 1:31:00 PM Number of Addenda: 0 Note Initiated On: 10/29/2023 12:13 PM Scope Withdrawal Time: 0 hours 15 minutes 5 seconds  Total Procedure Duration: 0 hours 21 minutes 37 seconds  Estimated Blood Loss:  Estimated blood loss was minimal.      [facility]

## 2023-10-29 NOTE — Anesthesia Preprocedure Evaluation (Signed)
Anesthesia Evaluation  Patient identified by MRN, date of birth, ID band Patient awake    Reviewed: Allergy & Precautions, NPO status , Patient's Chart, lab work & pertinent test results  Airway Mallampati: II  TM Distance: >3 FB Neck ROM: Full    Dental  (+) Teeth Intact   Pulmonary neg pulmonary ROS   Pulmonary exam normal breath sounds clear to auscultation       Cardiovascular Exercise Tolerance: Good hypertension, Pt. on medications + angina  + CAD and + Cardiac Stents  negative cardio ROS Normal cardiovascular exam Rhythm:Regular Rate:Normal     Neuro/Psych negative neurological ROS  negative psych ROS   GI/Hepatic negative GI ROS, Neg liver ROS,GERD  Medicated,,  Endo/Other  negative endocrine ROSHypothyroidism    Renal/GU negative Renal ROS  negative genitourinary   Musculoskeletal   Abdominal   Peds negative pediatric ROS (+)  Hematology negative hematology ROS (+)   Anesthesia Other Findings Past Medical History: No date: Acquired hypothyroidism No date: Aortic atherosclerosis (HCC) 08/13/2022: Basal cell carcinoma     Comment:  Left dorsal forearm. Superficial and nodular. Tx with               [facility] 09/17/22 03/24/2023: Basal cell carcinoma     Comment:  Left medial calf. Superficial BCC with focal               infiltration. [facility] 04/29/23 No date: Coronary artery disease     Comment:  a.) LHC 06/26/2019: EF 55-60%; LVEDP 25-30%; 80 pLAD,               40% mLAD, 40% p-mLCx, 30% mRCA; staged PCI planned. b.)               LHC/staged PCI 07/05/2019: 55% mLAD, 40% p-mLCx, 30%               mRCA, 85% p-mLAD, 50% D2 --> orbital atherectomy and PCI               performed placing overlapping 3.0 x 8 mm (pLAD) and 3.0 x              30 mm (mLAD) Resolute Onyx DES x 2 No date: Diverticulosis No date: ED (erectile dysfunction)     Comment:  a.) on PDE5i (tadalifil) No date: Elevated LFTs No date: GERD  (gastroesophageal reflux disease) No date: HLD (hyperlipidemia) No date: Hypertension No date: Long term current use of antithrombotics/antiplatelets     Comment:  a.) daily DAPT therapy (ASA + clopidogrel) No date: Melanoma (HCC)     Comment:  Left shoulder. 1984 2004: PAF (paroxysmal atrial fibrillation) (HCC)     Comment:  a.) CHA2DS2-VASc = 3 (age, HTN, aortic plaque). b.)               rate/rhythm maintained on oral diltiazem; on daily DAPT               (ASA + clopidogrel) No date: Paroxysmal SVT (supraventricular tachycardia) (HCC) No date: Pure hypercholesterolemia No date: Skin cancer     Comment:  Left forearm. NMSC. Removed 2008 or prior  Past Surgical History: 07/05/2019: CORONARY ATHERECTOMY; N/A     Comment:  Procedure: CORONARY ATHERECTOMY;  Surgeon: Iran Ouch, MD;  [facility]: [facility];  Service:               Cardiovascular;  Laterality: N/A;  07/05/2019: CORONARY BALLOON ANGIOPLASTY; N/A     Comment:  Procedure: CORONARY BALLOON ANGIOPLASTY;  Surgeon:               Iran Ouch, MD;  Location: [facility] CV [facility];                Service: Cardiovascular;  Laterality: N/A; 07/05/2019: CORONARY STENT INTERVENTION; N/A     Comment:  Procedure: CORONARY STENT INTERVENTION;  Surgeon: Iran Ouch, MD;  Location: [facility];  Service:               Cardiovascular;  Laterality: N/A; 2014: EYE SURGERY; Right     Comment:  Retina detachment 06/26/2019: LEFT HEART CATH AND CORONARY ANGIOGRAPHY; Left     Comment:  Procedure: LEFT HEART CATH AND CORONARY ANGIOGRAPHY;                Surgeon: Yvonne Kendall, MD;  Location: [facility] INVASIVE               CV LAB;  Service: Cardiovascular;  Laterality: Left; 05/05/2023: LEFT HEART CATH AND CORONARY ANGIOGRAPHY; N/A     Comment:  Procedure: LEFT HEART CATH AND CORONARY ANGIOGRAPHY;                Surgeon: Iran Ouch, MD;  Location: [facility] INV[facility] CV              LAB;   Service: Cardiovascular;  Laterality: N/A; 02/17/1983: MELANOMA EXCISION 2004: NECK SURGERY 07/07/2022: SHOULDER ARTHROSCOPY WITH SUBACROMIAL DECOMPRESSION,  ROTATOR CUFF REPAIR AND BICEP TENDON REPAIR; Right     Comment:  Procedure: SUBACROMIAL DECOMPRESSION, ROTATOR CUFF               REPAIR AND DISTAL CLAVICLE INCISION AND DEBRIDEMENT                   distal clavicle incision, debridement;  Surgeon: Lyndle Herrlich, MD;  Location: [facility] ORS;  Service: Orthopedics;               Laterality: Right; 04/18/2020: XI ROBOTIC ASSISTED INGUINAL HERNIA REPAIR WITH MESH;  Bilateral     Comment:  Procedure: XI ROBOTIC ASSISTED INGUINAL HERNIA REPAIR               WITH MESH;  Surgeon: Leafy Ro, MD;  Location: [facility]               ORS;  Service: General;  Laterality: Bilateral;  BMI    Body Mass Index: 26.99 kg/m      Reproductive/Obstetrics negative OB ROS                             Anesthesia Physical Anesthesia Plan  ASA: 3  Anesthesia Plan: General   Post-op Pain Management:    Induction: Intravenous  PONV Risk Score and Plan: Propofol infusion and TIVA  Airway Management Planned: Natural Airway and Nasal Cannula  Additional Equipment:   Intra-op Plan:   Post-operative Plan:   Informed Consent: I have reviewed the patients History and Physical, chart, labs and discussed the procedure including the risks, benefits and alternatives for the proposed anesthesia with the patient or authorized representative who has indicated his/her understanding and acceptance.     Dental Advisory Given  Plan Discussed with: CRNA and Surgeon  Anesthesia Plan Comments:        Anesthesia Quick Evaluation

## 2023-10-29 NOTE — Interval H&P Note (Signed)
History and Physical Interval Note:  10/29/2023 12:49 PM  [name] [name]  has presented today for surgery, with the diagnosis of colon cancer screening.  The various methods of treatment have been discussed with the patient and family. After consideration of risks, benefits and other options for treatment, the patient has consented to  Procedure(s): COLONOSCOPY WITH PROPOFOL (N/A) as a surgical intervention.  The patient's history has been reviewed, patient examined, no change in status, stable for surgery.  I have reviewed the patient's chart and labs.  Questions were answered to the patient's satisfaction.     Regis Bill  Ok to proceed with colonoscopy

## 2023-10-29 NOTE — Anesthesia Procedure Notes (Signed)
Procedure Name: MAC Date/Time: 10/29/2023 12:59 PM  Performed by: Elisabeth Pigeon, CRNAPre-anesthesia Checklist: Patient identified, Emergency Drugs available, Suction available, Patient being monitored and Timeout performed Patient Re-evaluated:Patient Re-evaluated prior to induction

## 2023-10-29 NOTE — Anesthesia Postprocedure Evaluation (Signed)
Anesthesia Post Note  Patient: [name] [name]  Procedure(s) Performed: COLONOSCOPY WITH PROPOFOL POLYPECTOMY HEMOSTASIS CLIP PLACEMENT  Patient location during evaluation: PACU Anesthesia Type: General Level of consciousness: awake Pain management: pain level controlled Vital Signs Assessment: post-procedure vital signs reviewed and stable Respiratory status: spontaneous breathing Cardiovascular status: stable Anesthetic complications: no   No notable events documented.   Last Vitals:  Vitals:   10/29/23 1340 10/29/23 1347  BP: 114/68 100/69  Pulse: 62 60  Resp: 20 18  Temp:    SpO2: 96% 95%    Last Pain:  Vitals:   10/29/23 1347  TempSrc:   PainSc: 0-No pain                 VAN STAVEREN,Chablis Losh

## 2023-10-29 NOTE — Transfer of Care (Signed)
Immediate Anesthesia Transfer of Care Note  Patient: [name] [name]  Procedure(s) Performed: COLONOSCOPY WITH PROPOFOL POLYPECTOMY HEMOSTASIS CLIP PLACEMENT  Patient Location: PACU  Anesthesia Type:MAC  Level of Consciousness: awake  Airway & Oxygen Therapy: Patient Spontanous Breathing  Post-op Assessment: Report given to RN and Post -op Vital signs reviewed and stable  Post vital signs: Reviewed and stable  Last Vitals:  Vitals Value Taken Time  BP 98/60 10/29/23 1331  Temp 36.8 C 10/29/23 1330  Pulse 66 10/29/23 1333  Resp 16 10/29/23 1330  SpO2 99 % 10/29/23 1333  Vitals shown include unfiled device data.  Last Pain:  Vitals:   10/29/23 1330  TempSrc: Temporal  PainSc: Asleep         Complications: No notable events documented.

## 2023-11-02 ENCOUNTER — Encounter: Payer: Self-pay | Admitting: Gastroenterology

## 2023-11-03 LAB — SURGICAL PATHOLOGY

## 2023-11-09 ENCOUNTER — Other Ambulatory Visit: Payer: Self-pay | Admitting: Family Medicine

## 2023-11-09 DIAGNOSIS — E039 Hypothyroidism, unspecified: Secondary | ICD-10-CM

## 2023-12-05 DIAGNOSIS — J069 Acute upper respiratory infection, unspecified: Secondary | ICD-10-CM | POA: Diagnosis not present

## 2023-12-12 ENCOUNTER — Other Ambulatory Visit: Payer: Self-pay | Admitting: Family Medicine

## 2023-12-12 DIAGNOSIS — E039 Hypothyroidism, unspecified: Secondary | ICD-10-CM

## 2024-01-04 ENCOUNTER — Other Ambulatory Visit: Payer: Self-pay | Admitting: Physician Assistant

## 2024-01-04 DIAGNOSIS — R079 Chest pain, unspecified: Secondary | ICD-10-CM

## 2024-01-04 NOTE — Telephone Encounter (Signed)
Last office visit: 06/28/23 with plan to f/u 6 months.  next office visit: none/active recall   Please schedule f/u appt.  Thanks!

## 2024-01-10 ENCOUNTER — Other Ambulatory Visit: Payer: Self-pay | Admitting: Family Medicine

## 2024-01-10 DIAGNOSIS — E039 Hypothyroidism, unspecified: Secondary | ICD-10-CM

## 2024-01-11 NOTE — Telephone Encounter (Signed)
 Requested medication (s) are due for refill today - yes  Requested medication (s) are on the active medication list -yes  Future visit scheduled -no  Last refill: 12/14/23 #30  Notes to clinic: needs appointment- courtesy refill has already been given- has notes  Requested Prescriptions  Pending Prescriptions Disp Refills   levothyroxine  (SYNTHROID ) 25 MCG tablet [Pharmacy Med Name: Levothyroxine  Sodium 25 MCG Oral Tablet] 30 tablet 0    Sig: TAKE 1 TABLET BY MOUTH ONCE DAILY BEFORE BREAKFAST . APPOINTMENT REQUIRED FOR FUTURE REFILLS     Endocrinology:  Hypothyroid Agents Failed - 01/11/2024 12:30 PM      Failed - TSH in normal range and within 360 days    TSH  Date Value Ref Range Status  03/25/2022 2.341 0.350 - 4.500 uIU/mL Final    Comment:    Performed by a 3rd Generation assay with a functional sensitivity of <=0.01 uIU/mL. Performed at [facility], [address] [place], [place] [address]   08/28/2020 3.580 0.450 - 4.500 uIU/mL Final         Passed - Valid encounter within last 12 months    Recent Outpatient Visits           9 months ago Encounter for subsequent annual wellness visit (AWV) in Medicare patient   [facility] Emilio Marseille T, FNP   1 year ago Herpes zoster without complication   [facility] Madelon Donald HERO, DO   1 year ago COVID-19   [facility] Emilio Marseille T, FNP   2 years ago Atherosclerotic heart disease of native coronary artery with other forms of angina pectoris [facility])   [facility] Emilio Marseille DASEN, FNP   2 years ago Viral URI   [facility] Bertrum Charlie LITTIE Mickey., MD       Future Appointments             In 2 weeks Dunn, Bernardino HERO, PA-C [facility] [place]   In 3 months Hester Alm BROCKS, MD [facility]               Requested Prescriptions  Pending  Prescriptions Disp Refills   levothyroxine  (SYNTHROID ) 25 MCG tablet [Pharmacy Med Name: Levothyroxine  Sodium 25 MCG Oral Tablet] 30 tablet 0    Sig: TAKE 1 TABLET BY MOUTH ONCE DAILY BEFORE BREAKFAST . APPOINTMENT REQUIRED FOR FUTURE REFILLS     Endocrinology:  Hypothyroid Agents Failed - 01/11/2024 12:30 PM      Failed - TSH in normal range and within 360 days    TSH  Date Value Ref Range Status  03/25/2022 2.341 0.350 - 4.500 uIU/mL Final    Comment:    Performed by a 3rd Generation assay with a functional sensitivity of <=0.01 uIU/mL. Performed at [facility], [address] [place], [place] [address]   08/28/2020 3.580 0.450 - 4.500 uIU/mL Final         Passed - Valid encounter within last 12 months    Recent Outpatient Visits           9 months ago Encounter for subsequent annual wellness visit (AWV) in Medicare patient   [facility] Emilio Marseille T, FNP   1 year ago Herpes zoster without complication   [facility] Madelon Donald HERO, DO   1 year ago COVID-19   [facility] Emilio Marseille T, FNP   2 years ago Atherosclerotic heart disease of native coronary artery with other forms of angina pectoris [facility])   [facility] Emilio Marseille T, FNP   2 years ago Viral URI   [facility] Bertrum Charlie LITTIE Mickey., MD       Future Appointments             In 2 weeks Dunn, Bernardino HERO, PA-C [facility] [place]   In 3 months Hester Alm BROCKS, MD [facility]

## 2024-01-13 ENCOUNTER — Other Ambulatory Visit: Payer: Self-pay | Admitting: Family Medicine

## 2024-01-13 DIAGNOSIS — E039 Hypothyroidism, unspecified: Secondary | ICD-10-CM

## 2024-01-13 NOTE — Telephone Encounter (Signed)
 Pt has scheduled next available with Dr. Verdia Glad, has only 1 pill left in his current supply.

## 2024-01-14 NOTE — Telephone Encounter (Signed)
 Pt. Has appointment. Requested Prescriptions  Pending Prescriptions Disp Refills   levothyroxine  (SYNTHROID ) 25 MCG tablet [Pharmacy Med Name: Levothyroxine  Sodium 25 MCG Oral Tablet] 30 tablet 0    Sig: TAKE 1 TABLET BY MOUTH ONCE DAILY BEFORE BREAKFAST . APPOINTMENT REQUIRED FOR FUTURE REFILLS     Endocrinology:  Hypothyroid Agents Failed - 01/14/2024 12:32 PM      Failed - TSH in normal range and within 360 days    TSH  Date Value Ref Range Status  03/25/2022 2.341 0.350 - 4.500 uIU/mL Final    Comment:    Performed by a 3rd Generation assay with a functional sensitivity of <=0.01 uIU/mL. Performed at [facility], [address] [place], [place] [address]   08/28/2020 3.580 0.450 - 4.500 uIU/mL Final         Passed - Valid encounter within last 12 months    Recent Outpatient Visits           9 months ago Encounter for subsequent annual wellness visit (AWV) in Medicare patient   [facility] Emilio Marseille T, FNP   1 year ago Herpes zoster without complication   [facility] Madelon Donald HERO, DO   1 year ago COVID-19   [facility] Emilio Marseille T, FNP   2 years ago Atherosclerotic heart disease of native coronary artery with other forms of angina pectoris [facility])   [facility] Emilio Marseille DASEN, FNP   2 years ago Viral URI   [facility] Bertrum Charlie LITTIE Mickey., MD       Future Appointments             In 2 weeks Simmons-Robinson, [place], MD [facility]   In 2 weeks Dunn, Bernardino HERO, PA-C [facility] [place]   In 2 months Hester Alm BROCKS, MD [facility]

## 2024-01-21 ENCOUNTER — Telehealth: Payer: Self-pay | Admitting: Cardiovascular Disease

## 2024-01-21 DIAGNOSIS — Z8669 Personal history of other diseases of the nervous system and sense organs: Secondary | ICD-10-CM | POA: Diagnosis not present

## 2024-01-21 DIAGNOSIS — H43812 Vitreous degeneration, left eye: Secondary | ICD-10-CM | POA: Diagnosis not present

## 2024-01-21 DIAGNOSIS — H35341 Macular cyst, hole, or pseudohole, right eye: Secondary | ICD-10-CM | POA: Diagnosis not present

## 2024-01-21 DIAGNOSIS — H2513 Age-related nuclear cataract, bilateral: Secondary | ICD-10-CM | POA: Diagnosis not present

## 2024-01-21 NOTE — Telephone Encounter (Signed)
  Patient would like to know if he should have any labs done before his 03/13/24 appt with Eula Listen. Please let patient know via MyChart message

## 2024-01-24 NOTE — Telephone Encounter (Signed)
Prior to his appointment, please have him come in for a fasting lipid panel, CMP, and CBC.  Diagnosis CAD.

## 2024-01-25 ENCOUNTER — Other Ambulatory Visit: Payer: Self-pay | Admitting: Emergency Medicine

## 2024-01-25 DIAGNOSIS — I25118 Atherosclerotic heart disease of native coronary artery with other forms of angina pectoris: Secondary | ICD-10-CM

## 2024-01-25 DIAGNOSIS — I2510 Atherosclerotic heart disease of native coronary artery without angina pectoris: Secondary | ICD-10-CM

## 2024-01-31 ENCOUNTER — Encounter: Payer: Self-pay | Admitting: Family Medicine

## 2024-01-31 ENCOUNTER — Ambulatory Visit: Payer: Medicare Other | Admitting: Physician Assistant

## 2024-01-31 ENCOUNTER — Ambulatory Visit (INDEPENDENT_AMBULATORY_CARE_PROVIDER_SITE_OTHER): Payer: Medicare Other | Admitting: Family Medicine

## 2024-01-31 VITALS — BP 133/59 | HR 63 | Resp 16 | Ht 72.0 in | Wt 208.0 lb

## 2024-01-31 DIAGNOSIS — N401 Enlarged prostate with lower urinary tract symptoms: Secondary | ICD-10-CM

## 2024-01-31 DIAGNOSIS — I480 Paroxysmal atrial fibrillation: Secondary | ICD-10-CM

## 2024-01-31 DIAGNOSIS — K219 Gastro-esophageal reflux disease without esophagitis: Secondary | ICD-10-CM

## 2024-01-31 DIAGNOSIS — E785 Hyperlipidemia, unspecified: Secondary | ICD-10-CM | POA: Diagnosis not present

## 2024-01-31 DIAGNOSIS — E039 Hypothyroidism, unspecified: Secondary | ICD-10-CM

## 2024-01-31 DIAGNOSIS — I25118 Atherosclerotic heart disease of native coronary artery with other forms of angina pectoris: Secondary | ICD-10-CM

## 2024-01-31 DIAGNOSIS — R0781 Pleurodynia: Secondary | ICD-10-CM

## 2024-01-31 DIAGNOSIS — R350 Frequency of micturition: Secondary | ICD-10-CM

## 2024-01-31 DIAGNOSIS — I2089 Other forms of angina pectoris: Secondary | ICD-10-CM

## 2024-01-31 MED ORDER — LEVOTHYROXINE SODIUM 25 MCG PO TABS: 25.0000 ug | ORAL_TABLET | Freq: Every day | ORAL | 1 refills | Status: DC

## 2024-01-31 NOTE — Assessment & Plan Note (Signed)
 Angina with effort and accelerating angina. Managed with nitroglycerin 0.4 mg as needed, Plavix 75 mg daily, and aspirin 81 mg daily. Two stents placed in 2020; recent catheterization shows stents in good condition. Emphasized continuing current medications to manage symptoms and prevent cardiac events. - Continue nitroglycerin 0.4 mg as needed - Continue Plavix 75 mg daily - Continue aspirin 81 mg daily - Follow up with cardiology

## 2024-01-31 NOTE — Assessment & Plan Note (Signed)
 Paroxysmal atrial fibrillation managed with diltiazem 120 mg twice daily. Reports occasional episodes but generally well-controlled. Emphasized medication adherence to prevent arrhythmia episodes. Chronic, controlled - Continue diltiazem 120 mg twice daily

## 2024-01-31 NOTE — Progress Notes (Signed)
 Established patient visit   Patient: [name] [name]   DOB: 11/23/1951   73 y.o. Male  MRN: [id number]  Visit Date: 01/31/2024  Today's healthcare provider: Ronnald Ramp, MD   Chief Complaint  Patient presents with  . Medical Management of Chronic Issues   Subjective       Discussed the use of AI scribe software for clinical note transcription with the patient, who gave verbal consent to proceed.  History of Present Illness   [name] "[name] [name]" is a 73 year old male who presents for a routine follow-up visit.  He has been experiencing lingering soreness at the bottom of his rib and shoulder blades, more pronounced on the left side. The soreness is described as a dull pain, not acute or stabbing, and is more noticeable at night. It is not bony pain and has been somewhat alleviated by getting a new bed, though it persists intermittently. No acute or sharp pain, blood in urine, or difficulty taking deep breaths. The soreness does not prevent him from taking a deep breath.  He has a history of angina with effort, accelerating angina, supraventricular tachycardia, nonsustained ventricular tachycardia, and paroxysmal atrial fibrillation. He continues to follow up with cardiology for these conditions and takes nitroglycerin 0.4 mg as needed, Plavix 75 mg daily, and aspirin 81 mg daily for his atherotic heart disease. Diltiazem 120 mg twice daily is effective in managing his arrhythmia symptoms. In 2020, he had two stents placed and underwent a catheterization last year, which confirmed the stents were in good condition. He has not experienced any significant episodes of arrhythmia since his heart surgery.  He has a history of hyperlipidemia and continues to take Crestor 40 mg and Zetia 10 mg daily, which have successfully lowered his cholesterol levels.  He has a history of hypothyroidism and takes levothyroxine 25 micrograms daily. He requests a 90-day supply of this  medication.  He mentions a history of erectile dysfunction and continues to follow up with urology for this condition.         Past Medical History:  Diagnosis Date  . Acquired hypothyroidism   . Aortic atherosclerosis (HCC)   . Basal cell carcinoma 08/13/2022   Left dorsal forearm. Superficial and nodular. Tx with [facility] 09/17/22  . Basal cell carcinoma 03/24/2023   Left medial calf. Superficial BCC with focal infiltration. [facility] 04/29/23  . Coronary artery disease    a.) LHC 06/26/2019: EF 55-60%; LVEDP 25-30%; 80 pLAD, 40% mLAD, 40% p-mLCx, 30% mRCA; staged PCI planned. b.) LHC/staged PCI 07/05/2019: 55% mLAD, 40% p-mLCx, 30% mRCA, 85% p-mLAD, 50% D2 --> orbital atherectomy and PCI performed placing overlapping 3.0 x 8 mm (pLAD) and 3.0 x 30 mm (mLAD) Resolute Onyx DES x 2  . Diverticulosis   . ED (erectile dysfunction)    a.) on PDE5i (tadalifil)  . Elevated LFTs   . GERD (gastroesophageal reflux disease)   . HLD (hyperlipidemia)   . Hypertension   . Long term current use of antithrombotics/antiplatelets    a.) daily DAPT therapy (ASA + clopidogrel)  . Melanoma (HCC)    Left shoulder. 1984  . PAF (paroxysmal atrial fibrillation) (HCC) 2004   a.) CHA2DS2-VASc = 3 (age, HTN, aortic plaque). b.) rate/rhythm maintained on oral diltiazem; on daily DAPT (ASA + clopidogrel)  . Paroxysmal SVT (supraventricular tachycardia) (HCC)   . Pure hypercholesterolemia   . Skin cancer    Left forearm. NMSC. Removed 2008 or prior    Medications: Outpatient  Medications Prior to Visit  Medication Sig  . acetaminophen (TYLENOL) 500 MG tablet Take 500-1,000 mg by mouth every 6 (six) hours as needed (pain.).  Marland Kitchen aspirin EC 81 MG tablet Take 81 mg by mouth in the morning.  . cetirizine (ZYRTEC) 10 MG tablet Take 10 mg by mouth at bedtime.   . clopidogrel (PLAVIX) 75 MG tablet Take 1 tablet (75 mg total) by mouth daily.  Marland Kitchen diltiazem (CARDIZEM CD) 120 MG 24 hr capsule TAKE 1 CAPSULE BY MOUTH IN THE  MORNING AND AT BEDTIME  . ezetimibe (ZETIA) 10 MG tablet TAKE 1 TABLET BY MOUTH ONCE DAILY IN THE EVENING  . famotidine (PEPCID) 20 MG tablet Take 20 mg by mouth daily before supper.  . fluticasone (FLONASE) 50 MCG/ACT nasal spray Place 1 spray into both nostrils in the morning.  . Misc Natural Products (OSTEO BI-FLEX TRIPLE STRENGTH PO) Take 2 tablets by mouth in the morning.  . Multiple Vitamin (MULTIVITAMIN WITH MINERALS) TABS tablet Take 1 tablet by mouth in the morning.  . nitroGLYCERIN (NITROSTAT) 0.4 MG SL tablet Place 1 tablet (0.4 mg total) under the tongue every 5 (five) minutes as needed for chest pain.  . Nutritional Supplements (FRUIT & VEGETABLE DAILY) CAPS Take 6 capsules by mouth in the morning. Balance of Nature Fruits (3) & Veggies (3)  . rosuvastatin (CRESTOR) 40 MG tablet Take 1 tablet by mouth once daily  . sildenafil (VIAGRA) 100 MG tablet Take 1 tablet (100 mg total) by mouth daily as needed for erectile dysfunction. Take two hours prior to intercourse on an empty stomach  . Turmeric 500 MG CAPS Take 500 mg by mouth in the morning.  Marland Kitchen VITAMIN D, CHOLECALCIFEROL, PO Take 1 tablet by mouth in the morning.  . [DISCONTINUED] levothyroxine (SYNTHROID) 25 MCG tablet TAKE 1 TABLET BY MOUTH ONCE DAILY BEFORE BREAKFAST . APPOINTMENT REQUIRED FOR FUTURE REFILLS   No facility-administered medications prior to visit.    Review of Systems  Last CBC Lab Results  Component Value Date   WBC 5.6 04/30/2023   HGB 14.4 04/30/2023   HCT 42.7 04/30/2023   MCV 87.5 04/30/2023   MCH 29.5 04/30/2023   RDW 13.8 04/30/2023   PLT 170 04/30/2023   Last metabolic panel Lab Results  Component Value Date   GLUCOSE 113 (H) 04/30/2023   NA 139 04/30/2023   K 3.7 04/30/2023   CL 108 04/30/2023   CO2 25 04/30/2023   BUN 23 04/30/2023   CREATININE 0.79 04/30/2023   GFRNONAA >60 04/30/2023   CALCIUM 10.2 04/30/2023   PROT 6.9 04/23/2023   ALBUMIN 4.4 04/23/2023   LABGLOB 2.3 08/28/2020    AGRATIO 1.9 08/28/2020   BILITOT 0.7 04/23/2023   ALKPHOS 52 04/23/2023   AST 37 04/23/2023   ALT 36 04/23/2023   ANIONGAP 6 04/30/2023   Last lipids Lab Results  Component Value Date   CHOL 132 04/23/2023   HDL 46 04/23/2023   LDLCALC 69 04/23/2023   TRIG 83 04/23/2023   CHOLHDL 2.9 04/23/2023   Last hemoglobin A1c No results found for: "HGBA1C" Last thyroid functions Lab Results  Component Value Date   TSH 2.341 03/25/2022        Objective    BP (!) 133/59 (BP Location: Right Arm, Patient Position: Sitting, Cuff Size: Normal)   Pulse 63   Resp 16   Ht 6' (1.829 m)   Wt 208 lb (94.3 kg)   SpO2 98%   BMI 28.21 kg/m  BP Readings from Last 3 Encounters:  01/31/24 (!) 133/59  10/29/23 100/69  06/28/23 124/60   Wt Readings from Last 3 Encounters:  01/31/24 208 lb (94.3 kg)  10/29/23 199 lb (90.3 kg)  06/28/23 213 lb (96.6 kg)        Physical Exam  General: Alert, no acute distress Cardio: Normal S1 and S2, RRR, no r/m/g Pulm: CTAB, normal work of breathing EXTRM: no LE edema BACK: tenderness to palpation of inferior rib borders bilaterally, no nodules palpated on exam, no changes to overlying skin   No results found for any visits on 01/31/24.  Assessment & Plan     Problem List Items Addressed This Visit       Cardiovascular and Mediastinum   Paroxysmal atrial fibrillation (HCC) - Primary   Paroxysmal atrial fibrillation managed with diltiazem 120 mg twice daily. Reports occasional episodes but generally well-controlled. Emphasized medication adherence to prevent arrhythmia episodes. Chronic, controlled - Continue diltiazem 120 mg twice daily      Relevant Orders   CMP14+EGFR   Effort angina (HCC)   Angina with effort and accelerating angina. Managed with nitroglycerin 0.4 mg as needed, Plavix 75 mg daily, and aspirin 81 mg daily. Two stents placed in 2020; recent catheterization shows stents in good condition. Emphasized continuing current  medications to manage symptoms and prevent cardiac events. - Continue nitroglycerin 0.4 mg as needed - Continue Plavix 75 mg daily - Continue aspirin 81 mg daily - Follow up with cardiology      Atherosclerotic heart disease of native coronary artery with other forms of angina pectoris (HCC)     Digestive   Gastroesophageal reflux disease     Endocrine   Hypothyroidism   Chronic hypothyroidism managed with levothyroxine 25 mcg daily. Thyroid levels not checked recently. Emphasized need for regular monitoring of thyroid function. - Continue levothyroxine 25 mcg daily - Order thyroid function tests - Prescribe 90-day supply of levothyroxine      Relevant Medications   levothyroxine (SYNTHROID) 25 MCG tablet   Other Relevant Orders   TSH+T4F+T3Free   TSH+T4F+T3Free   Acquired hypothyroidism   Relevant Medications   levothyroxine (SYNTHROID) 25 MCG tablet   Other Relevant Orders   TSH+T4F+T3Free     Other   Dyslipidemia   Chronic hyperlipidemia managed with Crestor 40 mg and Zetia 10 mg daily. Significant improvement in cholesterol levels with current regimen. Emphasized maintaining current therapy to keep cholesterol levels within target range. - Continue Crestor 40 mg daily - Continue Zetia 10 mg daily - Follow up with cardiology      Benign prostatic hyperplasia with urinary frequency   Other Visit Diagnoses       Rib pain             Back Pain Chronic soreness at the bottom of the rib and shoulder blades, more pronounced on the left side. Suspected musculoskeletal origin. Differential includes musculoskeletal pain, kidney issues, or other internal organ-related pain. Consider further imaging if symptoms worsen. - Order metabolic panel to check kidney and liver function - Monitor symptoms and consider imaging if pain worsens   General Health Maintenance Due for annual physical examination. Emphasized importance of regular check-ups and preventative screenings. -  Schedule annual physical examination in April - Perform blood work including metabolic panel and thyroid function tests     Return in about 9 weeks (around 04/03/2024) for AWV.      Ronnald Ramp, MD  [facility] [phone] (phone) [phone] (  fax)  [facility]

## 2024-01-31 NOTE — Assessment & Plan Note (Signed)
 Chronic hypothyroidism managed with levothyroxine 25 mcg daily. Thyroid levels not checked recently. Emphasized need for regular monitoring of thyroid function. - Continue levothyroxine 25 mcg daily - Order thyroid function tests - Prescribe 90-day supply of levothyroxine

## 2024-01-31 NOTE — Assessment & Plan Note (Signed)
 Chronic hyperlipidemia managed with Crestor 40 mg and Zetia 10 mg daily. Significant improvement in cholesterol levels with current regimen. Emphasized maintaining current therapy to keep cholesterol levels within target range. - Continue Crestor 40 mg daily - Continue Zetia 10 mg daily - Follow up with cardiology

## 2024-02-01 ENCOUNTER — Ambulatory Visit: Payer: Self-pay | Admitting: Family Medicine

## 2024-02-01 ENCOUNTER — Encounter: Payer: Self-pay | Admitting: Family Medicine

## 2024-02-01 LAB — TSH+T4F+T3FREE
Free T4: 1.3 ng/dL (ref 0.82–1.77)
T3, Free: 3 pg/mL (ref 2.0–4.4)
TSH: 3.3 u[IU]/mL (ref 0.450–4.500)

## 2024-02-01 LAB — CMP14+EGFR
ALT: 38 [IU]/L (ref 0–44)
AST: 29 [IU]/L (ref 0–40)
Albumin: 4.4 g/dL (ref 3.8–4.8)
Alkaline Phosphatase: 69 [IU]/L (ref 44–121)
BUN/Creatinine Ratio: 18 (ref 10–24)
BUN: 16 mg/dL (ref 8–27)
Bilirubin Total: 0.4 mg/dL (ref 0.0–1.2)
CO2: 25 mmol/L (ref 20–29)
Calcium: 11 mg/dL — ABNORMAL HIGH (ref 8.6–10.2)
Chloride: 107 mmol/L — ABNORMAL HIGH (ref 96–106)
Creatinine, Ser: 0.87 mg/dL (ref 0.76–1.27)
Globulin, Total: 2.3 g/dL (ref 1.5–4.5)
Glucose: 105 mg/dL — ABNORMAL HIGH (ref 70–99)
Potassium: 4.1 mmol/L (ref 3.5–5.2)
Sodium: 143 mmol/L (ref 134–144)
Total Protein: 6.7 g/dL (ref 6.0–8.5)
eGFR: 91 mL/min/{1.73_m2} (ref >59)

## 2024-02-01 NOTE — Addendum Note (Signed)
 Addended by: Bing Neighbors on: 02/01/2024 07:52 AM   Modules accepted: Orders

## 2024-02-01 NOTE — Telephone Encounter (Signed)
 Patient aware.

## 2024-02-01 NOTE — Telephone Encounter (Signed)
 Patient called in after reviewing notes from PCP about Calcium levels and is wondering if he needs to stop taking his prescribed daily Vitamin D 125 mg. Patient also states he takes a daily multivitamin for men over 50 from [facility] that has vitamin D in it. Please advise on if he needs to cut back or stop the prescribed vitamin D.  Patient is also asking how he should complete his labs that PCP is recommending for Thursday. Does patient need a lab order from PCP to come into [facility] or should he go to a specific lab location? Please provide instructions to patient on how to complete lab testing for repeat labs on Thursday.   Copied from CRM [id number]. Topic: Clinical - Lab/Test Results >> Feb 01, 2024 11:20 AM Priscille Loveless wrote: Reason for CRM: Lab results Reason for Disposition  [1] Caller requesting NON-URGENT health information AND [2] PCP's office is the best resource  Answer Assessment - Initial Assessment Questions 1. REASON FOR CALL or QUESTION: "What is your reason for calling today?" or "How can I best help you?" or "What question do you have that I can help answer?"     Please see list of questions from patient about lab levels and repeat lab information and contact patient back asap via MyChart or phone call.  Protocols used: Information Only Call - No Triage-A-AH

## 2024-02-04 ENCOUNTER — Telehealth: Payer: Self-pay | Admitting: Family Medicine

## 2024-02-04 ENCOUNTER — Telehealth: Payer: Self-pay

## 2024-02-04 NOTE — Telephone Encounter (Signed)
 Called and spoke to the pt, he wanted to know his PTH results, and if he can start taking Turmeric, Osteo B, and Balance of Nature vitamin again since they dont contain vit D. He also wanted to know what the next step or is there a plan to help get the calcium lowered. Please advise

## 2024-02-04 NOTE — Telephone Encounter (Signed)
 Duplicate message.

## 2024-02-04 NOTE — Telephone Encounter (Signed)
 Copied from CRM [id number]. Topic: Clinical - Medical Advice >> Feb 04, 2024  3:24 PM Gery Pray wrote: Reason for CRM: Patient calling regarding lab results. Patient would like Dr. Neita Garnet to give him a call regarding results and next steps. Please contact number is [phone]

## 2024-02-07 NOTE — Telephone Encounter (Signed)
 PTH results were within normal limits   Ok for patient to take supplements that do not contain vitamin D

## 2024-02-07 NOTE — Telephone Encounter (Signed)
 Called and was able to inform the pt of his PTH lab results and the ok the restart his vitamins that dont contain Vitamin D. He verbally stated he understood and had not other questions

## 2024-02-14 LAB — VITAMIN D 1,25 DIHYDROXY
Vitamin D 1, 25 (OH)2 Total: 44 pg/mL
Vitamin D2 1, 25 (OH)2: <10 pg/mL
Vitamin D3 1, 25 (OH)2: 39 pg/mL

## 2024-02-14 LAB — PTH, INTACT AND CALCIUM
Calcium: 10.3 mg/dL — ABNORMAL HIGH (ref 8.6–10.2)
PTH: 26 pg/mL (ref 15–65)

## 2024-02-14 LAB — VITAMIN D 25 HYDROXY (VIT D DEFICIENCY, FRACTURES): Vit D, 25-Hydroxy: 104 ng/mL — ABNORMAL HIGH (ref 30.0–100.0)

## 2024-02-21 DIAGNOSIS — H2513 Age-related nuclear cataract, bilateral: Secondary | ICD-10-CM | POA: Diagnosis not present

## 2024-02-21 DIAGNOSIS — H35341 Macular cyst, hole, or pseudohole, right eye: Secondary | ICD-10-CM | POA: Diagnosis not present

## 2024-02-21 DIAGNOSIS — Z8669 Personal history of other diseases of the nervous system and sense organs: Secondary | ICD-10-CM | POA: Diagnosis not present

## 2024-02-21 DIAGNOSIS — H40013 Open angle with borderline findings, low risk, bilateral: Secondary | ICD-10-CM | POA: Diagnosis not present

## 2024-02-25 DIAGNOSIS — H43392 Other vitreous opacities, left eye: Secondary | ICD-10-CM | POA: Diagnosis not present

## 2024-02-25 DIAGNOSIS — H2513 Age-related nuclear cataract, bilateral: Secondary | ICD-10-CM | POA: Diagnosis not present

## 2024-02-25 DIAGNOSIS — H35372 Puckering of macula, left eye: Secondary | ICD-10-CM | POA: Diagnosis not present

## 2024-03-09 ENCOUNTER — Other Ambulatory Visit
Admission: RE | Admit: 2024-03-09 | Discharge: 2024-03-09 | Disposition: A | Discharge status: Home / Self Care | Attending: Physician Assistant | Admitting: Physician Assistant

## 2024-03-09 DIAGNOSIS — I2510 Atherosclerotic heart disease of native coronary artery without angina pectoris: Secondary | ICD-10-CM | POA: Insufficient documentation

## 2024-03-09 LAB — COMPREHENSIVE METABOLIC PANEL WITH GFR
ALT: 30 U/L (ref 0–44)
AST: 29 U/L (ref 15–41)
Albumin: 4 g/dL (ref 3.5–5.0)
Alkaline Phosphatase: 48 U/L (ref 38–126)
Anion gap: 7 (ref 5–15)
BUN: 24 mg/dL — ABNORMAL HIGH (ref 8–23)
CO2: 24 mmol/L (ref 22–32)
Calcium: 10 mg/dL (ref 8.9–10.3)
Chloride: 107 mmol/L (ref 98–111)
Creatinine, Ser: 0.87 mg/dL (ref 0.61–1.24)
GFR, Estimated: >60 mL/min (ref >60)
Glucose, Bld: 102 mg/dL — ABNORMAL HIGH (ref 70–99)
Potassium: 3.9 mmol/L (ref 3.5–5.1)
Sodium: 138 mmol/L (ref 135–145)
Total Bilirubin: 0.5 mg/dL (ref 0.0–1.2)
Total Protein: 6.9 g/dL (ref 6.5–8.1)

## 2024-03-09 LAB — LIPID PANEL
Cholesterol: 138 mg/dL (ref 0–200)
HDL: 43 mg/dL (ref >40)
LDL Cholesterol: 71 mg/dL (ref 0–99)
Total CHOL/HDL Ratio: 3.2 ratio
Triglycerides: 121 mg/dL (ref <150)
VLDL: 24 mg/dL (ref 0–40)

## 2024-03-09 LAB — CBC
HCT: 41.4 % (ref 39.0–52.0)
Hemoglobin: 14.1 g/dL (ref 13.0–17.0)
MCH: 30.4 pg (ref 26.0–34.0)
MCHC: 34.1 g/dL (ref 30.0–36.0)
MCV: 89.2 fL (ref 80.0–100.0)
Platelets: 162 10*3/uL (ref 150–400)
RBC: 4.64 MIL/uL (ref 4.22–5.81)
RDW: 13.4 % (ref 11.5–15.5)
WBC: 5.3 10*3/uL (ref 4.0–10.5)
nRBC: 0 % (ref 0.0–0.2)

## 2024-03-13 ENCOUNTER — Ambulatory Visit: Payer: Medicare Other | Attending: Physician Assistant | Admitting: Physician Assistant

## 2024-03-13 ENCOUNTER — Encounter: Payer: Self-pay | Admitting: Physician Assistant

## 2024-03-13 VITALS — BP 130/72 | HR 72 | Ht 72.0 in | Wt 210.4 lb

## 2024-03-13 DIAGNOSIS — I5189 Other ill-defined heart diseases: Secondary | ICD-10-CM

## 2024-03-13 DIAGNOSIS — I25118 Atherosclerotic heart disease of native coronary artery with other forms of angina pectoris: Secondary | ICD-10-CM

## 2024-03-13 DIAGNOSIS — I471 Supraventricular tachycardia, unspecified: Secondary | ICD-10-CM | POA: Diagnosis not present

## 2024-03-13 DIAGNOSIS — R29898 Other symptoms and signs involving the musculoskeletal system: Secondary | ICD-10-CM

## 2024-03-13 DIAGNOSIS — I480 Paroxysmal atrial fibrillation: Secondary | ICD-10-CM

## 2024-03-13 DIAGNOSIS — H35341 Macular cyst, hole, or pseudohole, right eye: Secondary | ICD-10-CM | POA: Diagnosis not present

## 2024-03-13 DIAGNOSIS — E7849 Other hyperlipidemia: Secondary | ICD-10-CM

## 2024-03-13 DIAGNOSIS — H40013 Open angle with borderline findings, low risk, bilateral: Secondary | ICD-10-CM | POA: Diagnosis not present

## 2024-03-13 DIAGNOSIS — E785 Hyperlipidemia, unspecified: Secondary | ICD-10-CM

## 2024-03-13 DIAGNOSIS — Z8669 Personal history of other diseases of the nervous system and sense organs: Secondary | ICD-10-CM | POA: Diagnosis not present

## 2024-03-13 DIAGNOSIS — H2513 Age-related nuclear cataract, bilateral: Secondary | ICD-10-CM | POA: Diagnosis not present

## 2024-03-13 NOTE — Patient Instructions (Signed)
 Medication Instructions:  Your physician recommends the following medication changes.  HOLD: Crestor for two weeks and then message Eula Listen, PA-C to see how you are doing  *If you need a refill on your cardiac medications before your next appointment, please call your pharmacy*  Lab Work: None ordered at this time   Follow-Up: At [facility], you and your health needs are our priority.  As part of our continuing mission to provide you with exceptional heart care, our providers are all part of one team.  This team includes your primary Cardiologist (physician) and Advanced Practice Providers or APPs (Physician Assistants and Nurse Practitioners) who all work together to provide you with the care you need, when you need it.  Your next appointment:   6 month(s)  Provider:   You may see Lorine Bears, MD or Eula Listen, PA-C

## 2024-03-13 NOTE — Progress Notes (Signed)
 Cardiology Office Note    Date:  03/13/2024   ID:  [name] [name], DOB 12-31-1950, MRN [id number]  PCP:  Ronnald Ramp, MD  Cardiologist:  Lorine Bears, MD  Electrophysiologist:  None   Chief Complaint: Follow up  History of Present Illness:   [name] [name] is a 73 y.o. male with history of CAD s/p PCI/DES to the LAD in 06/2019, PAF diagnosed in 2004 not on OAC in the setting of CHADS2VASc of 1, paroxysmal SVT, mild aortic valve regurgitation, diastolic dysfunction, hypothyroidism on Synthroid, severe possibly familial hyperlipidemia, elevated liver function, and strong family history of premature coronary artery disease on his father's side who presents for follow up of CAD.   He was previously managed by Dr. Lois Huxley in [place], [place].  He was initially diagnosed with A. fib in 2004 and was managed with metoprolol though this was discontinued secondary to nightmares.  Since then, he has been managed with diltiazem.  Prior stress echo in 2013 was unremarkable.  Holter monitor in 12/2014 showed the predominant rhythm was sinus with an average heart rate of 71 bpm, minimum heart rate 45 bpm, maximum heart rate 118 bpm, rare isolated PVCs, frequent isolated PACs totaling 4587, 6 atrial couplets, 3 runs of SVT with the longest lasting 7 beats with a maximum rate of 141 bpm, no significant pauses.  No evidence of A. Fib.  He underwent treadmill stress test on 03/16/2018 that showed no evidence of ischemia with good exercise capacity with an exercise duration of 7 minutes and 48 seconds.  He was evaluated in 04/2019 for palpitations and chest pain.  Echo showed an EF of 55-60%, mild concentric LVH, normal diastolic function, normal RVSF, grossly normal mitral and tricuspid valves, mild AI.  Subsequent Zio in 05/2019 showed NSR with an average heart rate of 73 bpm with 27 episodes of short SVT with the longest episode lasting 13 beats.  He underwent coronary CTA in 06/2019, which showed left main FFR  0.98, ostial LAD FFR 0.98, proximal LAD FFR 0.50, proximal LCx FFR 0.95, mid LCx FFR 0.78, OM1 FFR 0.77, proximal RCA FFR 0.98, mid RCA FFR 0.82, PDA FFR 0.79. Given this, he underwent diagnostic LHC on 06/26/2019 that showed severe single-vessel CAD with 80-90% proximal LAD stenosis with heavy calcification as well as mild to moderate nonobstructive CAD involving the mid LAD, mid LCx, and mid RCA with elevated LVEDP and normal LV contraction consistent with diastolic dysfunction. Given the heavy calcification of the LAD, it was recommended he undergo staged PCI with atherectomy. He underwent successful complex orbital atherectomy and 2 overlapping DES to the proximal and mid LAD extending into the ostium with balloon angioplasty of the ostial D2. Procedure was complicated by a non-flow limiting dissection in the ostial diagonal with normal flow that did not require treatment.    He was seen in the office in 07/2021 and was doing well from a cardiac perspective without symptoms of angina or decompensation.  He noted an improvement in his overall palpitation burden following prior titration of Cardizem CD to 240 mg daily.     He was seen on 03/19/2022 noting some palpitations and chest/leg heaviness and fatigue that felt similar to what he experienced leading up to his PCI.  At the time of his visit, his palpitation burden had improved.  Subsequent Lexiscan MPI on 03/25/2022 showed no evidence of ischemia with an EF of 56% and was overall low risk.  LAD and RCA calcifications were noted on CT imaging.  He was seen in the office in 04/2022 and was without symptoms of angina or decompensation.  He also noted less palpitations, though did continue to have some in the p.m.  Given this, Cardizem CD was changed to 120 mg twice daily.   He was seen in the office in 10/2022 and was without symptoms of angina or cardiac decompensation.  Palpitation burden had improved following transitioning off Cardizem to 120 mg twice  daily.  However, with this he did note an increase in lower extremity swelling that was progressive throughout the day and improved when lying supine overnight.  He was started on low-dose furosemide 20 mg daily with recommendation to elevate legs and wear compression socks as well.   He was seen in the office in 04/2023 noting an increase in generalized malaise and fatigue that felt similar to what he experienced leading up to his PCI in 2020.  He was without frank chest pain or dyspnea.  LHC on 05/05/2023 showed patent LAD stents with minimal restenosis and stable mild to moderate LCx and RCA disease with normal LV systolic function and mildly elevated LVEDP at 20 mmHg.  Medical therapy was recommended.  Echo on 05/27/2023 showed an EF of 55%, no regional wall motion abnormalities, normal LV diastolic function parameters, normal RV systolic function and ventricular cavity size, tricuspid aortic valve with mild to moderate insufficiency, borderline dilatation of the ascending aorta measuring 38 mm, and an estimated right atrial pressure of 3 mmHg.  He was last seen in the office in 06/2023 noting improvement in underlying fatigue and was without symptoms of angina or cardiac decompensation.  No changes in pharmacotherapy were indicated at that time.  He comes in doing well from a cardiac perspective and is without symptoms of angina or cardiac decompensation.  He does continue to note some underlying fatigue involving the lower extremities.  No frank myalgias.  No dizziness, presyncope, or syncope.  Calcium improved off of supplements.   Labs independently reviewed: 03/2024 - potassium 3.9, BUN 24, serum creatinine 0.87, albumin 4.0, AST/LT normal, Hgb 14.1, PLT 162, TC 138, TG 121, HDL 43, LDL 71 01/2024 - TSH normal  Past Medical History:  Diagnosis Date   Acquired hypothyroidism    Aortic atherosclerosis (HCC)    Basal cell carcinoma 08/13/2022   Left dorsal forearm. Superficial and nodular. Tx with  [facility] 09/17/22   Basal cell carcinoma 03/24/2023   Left medial calf. Superficial BCC with focal infiltration. [facility] 04/29/23   Coronary artery disease    a.) LHC 06/26/2019: EF 55-60%; LVEDP 25-30%; 80 pLAD, 40% mLAD, 40% p-mLCx, 30% mRCA; staged PCI planned. b.) LHC/staged PCI 07/05/2019: 55% mLAD, 40% p-mLCx, 30% mRCA, 85% p-mLAD, 50% D2 --> orbital atherectomy and PCI performed placing overlapping 3.0 x 8 mm (pLAD) and 3.0 x 30 mm (mLAD) Resolute Onyx DES x 2   Diverticulosis    ED (erectile dysfunction)    a.) on PDE5i (tadalifil)   Elevated LFTs    GERD (gastroesophageal reflux disease)    HLD (hyperlipidemia)    Hypertension    Long term current use of antithrombotics/antiplatelets    a.) daily DAPT therapy (ASA + clopidogrel)   Melanoma (HCC)    Left shoulder. 1984   PAF (paroxysmal atrial fibrillation) (HCC) 2004   a.) CHA2DS2-VASc = 3 (age, HTN, aortic plaque). b.) rate/rhythm maintained on oral diltiazem; on daily DAPT (ASA + clopidogrel)   Paroxysmal SVT (supraventricular tachycardia) (HCC)    Pure hypercholesterolemia    Skin  cancer    Left forearm. NMSC. Removed 2008 or prior    Past Surgical History:  Procedure Laterality Date   COLONOSCOPY WITH PROPOFOL N/A 10/29/2023   Procedure: COLONOSCOPY WITH PROPOFOL;  Surgeon: Regis Bill, MD;  Location: [facility] ENDOSCOPY;  Service: Endoscopy;  Laterality: N/A;   CORONARY ATHERECTOMY N/A 07/05/2019   Procedure: CORONARY ATHERECTOMY;  Surgeon: Iran Ouch, MD;  [facility]: [facility];  Service: Cardiovascular;  Laterality: N/A;   CORONARY BALLOON ANGIOPLASTY N/A 07/05/2019   Procedure: CORONARY BALLOON ANGIOPLASTY;  Surgeon: Iran Ouch, MD;  Location: [facility];  Service: Cardiovascular;  Laterality: N/A;   CORONARY STENT INTERVENTION N/A 07/05/2019   Procedure: CORONARY STENT INTERVENTION;  Surgeon: Iran Ouch, MD;  Location: [facility];  Service: Cardiovascular;  Laterality: N/A;    EYE SURGERY Right 2014   Retina detachment   HEMOSTASIS CLIP PLACEMENT  10/29/2023   Procedure: HEMOSTASIS CLIP PLACEMENT;  Surgeon: Regis Bill, MD;  Location: [facility] ENDOSCOPY;  Service: Endoscopy;;   LEFT HEART CATH AND CORONARY ANGIOGRAPHY Left 06/26/2019   Procedure: LEFT HEART CATH AND CORONARY ANGIOGRAPHY;  Surgeon: Yvonne Kendall, MD;  Location: [facility] CV [facility];  Service: Cardiovascular;  Laterality: Left;   LEFT HEART CATH AND CORONARY ANGIOGRAPHY N/A 05/05/2023   Procedure: LEFT HEART CATH AND CORONARY ANGIOGRAPHY;  Surgeon: Iran Ouch, MD;  Location: [facility];  Service: Cardiovascular;  Laterality: N/A;   MELANOMA EXCISION  02/17/1983   NECK SURGERY  2004   POLYPECTOMY  10/29/2023   Procedure: POLYPECTOMY;  Surgeon: Regis Bill, MD;  Location: [facility] ENDOSCOPY;  Service: Endoscopy;;   SHOULDER ARTHROSCOPY WITH SUBACROMIAL DECOMPRESSION, ROTATOR CUFF REPAIR AND BICEP TENDON REPAIR Right 07/07/2022   Procedure: SUBACROMIAL DECOMPRESSION, ROTATOR CUFF REPAIR AND DISTAL CLAVICLE INCISION AND DEBRIDEMENT     distal clavicle incision, debridement;  Surgeon: Lyndle Herrlich, MD;  Location: [facility] ORS;  Service: Orthopedics;  Laterality: Right;   XI ROBOTIC ASSISTED INGUINAL HERNIA REPAIR WITH MESH Bilateral 04/18/2020   Procedure: XI ROBOTIC ASSISTED INGUINAL HERNIA REPAIR WITH MESH;  Surgeon: Leafy Ro, MD;  Location: [facility] OR[facility];  Service: General;  Laterality: Bilateral;    Current Medications: Current Meds  Medication Sig   acetaminophen (TYLENOL) 500 MG tablet Take 500-1,000 mg by mouth every 6 (six) hours as needed (pain.).   aspirin EC 81 MG tablet Take 81 mg by mouth in the morning.   cetirizine (ZYRTEC) 10 MG tablet Take 10 mg by mouth at bedtime.    clopidogrel (PLAVIX) 75 MG tablet Take 1 tablet (75 mg total) by mouth daily.   diltiazem (CARDIZEM CD) 120 MG 24 hr capsule TAKE 1 CAPSULE BY MOUTH IN THE MORNING AND AT BEDTIME   ezetimibe (ZETIA)  10 MG tablet TAKE 1 TABLET BY MOUTH ONCE DAILY IN THE EVENING   famotidine (PEPCID) 20 MG tablet Take 20 mg by mouth daily before supper.   fluticasone (FLONASE) 50 MCG/ACT nasal spray Place 1 spray into both nostrils in the morning.   levothyroxine (SYNTHROID) 25 MCG tablet Take 1 tablet (25 mcg total) by mouth daily before breakfast.   Misc Natural Products (OSTEO BI-FLEX TRIPLE STRENGTH PO) Take 2 tablets by mouth in the morning.   Multiple Vitamin (MULTIVITAMIN WITH MINERALS) TABS tablet Take 1 tablet by mouth in the morning.   nitroGLYCERIN (NITROSTAT) 0.4 MG SL tablet Place 1 tablet (0.4 mg total) under the tongue every 5 (five) minutes as needed  for chest pain.   Nutritional Supplements (FRUIT & VEGETABLE DAILY) CAPS Take 6 capsules by mouth in the morning. Balance of Nature Fruits (3) & Veggies (3)   rosuvastatin (CRESTOR) 40 MG tablet Take 1 tablet by mouth once daily   sildenafil (VIAGRA) 100 MG tablet Take 1 tablet (100 mg total) by mouth daily as needed for erectile dysfunction. Take two hours prior to intercourse on an empty stomach   Turmeric 500 MG CAPS Take 500 mg by mouth in the morning.   VITAMIN D, CHOLECALCIFEROL, PO Take 1 tablet by mouth in the morning.    Allergies:   Hydrocodone-acetaminophen and Vicodin [hydrocodone-acetaminophen]   Social History   Socioeconomic History   Marital status: Married    Spouse name: Lyla Son    Number of children: 2   Years of education: Not on file   Highest education level: Not on file  Occupational History   Not on file  Tobacco Use   Smoking status: Never    Passive exposure: Never   Smokeless tobacco: Never  Vaping Use   Vaping status: Never Used  Substance and Sexual Activity   Alcohol use: Yes    Comment: Occ.    Drug use: No   Sexual activity: Yes    Birth control/protection: None  Other Topics Concern   Not on file  Social History Narrative   Not on file   Social Drivers of Health   Financial Resource Strain:  Low Risk  (01/31/2024)   Overall Financial Resource Strain (CARDIA)    Difficulty of Paying Living Expenses: Not hard at all  Food Insecurity: No Food Insecurity (01/31/2024)   Hunger Vital Sign    Worried About Running Out of Food in the Last Year: Never true    Ran Out of Food in the Last Year: Never true  Transportation Needs: No Transportation Needs (01/31/2024)   PRAPARE - Administrator, Civil Service (Medical): No    Lack of Transportation (Non-Medical): No  Physical Activity: Sufficiently Active (01/31/2024)   Exercise Vital Sign    Days of Exercise per Week: 5 days    Minutes of Exercise per Session: 30 min  Stress: No Stress Concern Present (01/31/2024)   [employer] of Occupational Health - Occupational Stress Questionnaire    Feeling of Stress : Not at all  Social Connections: Socially Integrated (01/31/2024)   Social Connection and Isolation Panel [NHANES]    Frequency of Communication with Friends and Family: More than three times a week    Frequency of Social Gatherings with Friends and Family: More than three times a week    Attends Religious Services: More than 4 times per year    Active Member of [employer] or Organizations: Yes    Attends Engineer, structural: More than 4 times per year    Marital Status: Married     Family History:  The patient's family history includes Atrial fibrillation in his mother; Heart disease in his father; Stroke in his father.  ROS:   12-point review of systems is negative unless otherwise noted in the HPI.   EKGs/Labs/Other Studies Reviewed:    Studies reviewed were summarized above. The additional studies were reviewed today:  2D echo 05/31/2023: 1. Left ventricular ejection fraction, by estimation, is 55%. Left  ventricular ejection fraction by PLAX is 55 %. The left ventricle has  normal function. The left ventricle has no regional wall motion  abnormalities. Left ventricular diastolic parameters   were  normal.  2. Right ventricular systolic function is normal. The right ventricular  size is normal.   3. The mitral valve is normal in structure. No evidence of mitral valve  regurgitation.   4. The aortic valve is tricuspid. Aortic valve regurgitation is mild to  moderate.   5. Aortic dilatation noted. There is borderline dilatation of the  ascending aorta, measuring 38 mm.   6. The inferior vena cava is normal in size with greater than 50%  respiratory variability, suggesting right atrial pressure of 3 mmHg.  __________   LHC 05/05/2023:   Mid RCA lesion is 30% stenosed.   2nd Diag lesion is 30% stenosed.   Prox Cx to Mid Cx lesion is 30% stenosed.   Mid LAD lesion is 10% stenosed.   Non-stenotic Prox LAD to Mid LAD lesion was previously treated.   The left ventricular systolic function is normal.   LV end diastolic pressure is mildly elevated.   The left ventricular ejection fraction is 55-65% by visual estimate.   1.  Widely patent LAD stents with minimal restenosis.  Stable mild to moderate left circumflex and RCA disease. 2.  Normal LV systolic function with mildly elevated left ventricular end-diastolic pressure at 20 mmHg.   Recommendations: Continue aggressive medical therapy. __________   Eugenie Birks MPI 03/25/2022:   . The study is low risk.   There is no evidence for ischemia   Left ventricular function is normal. LVEF = 56%   LAD and RCA calcifications noted. __________   2D Echo 04/2018: 1. The left ventricle has normal systolic function, with an ejection fraction of 55-60%. The cavity size was normal. There is mild concentric left ventricular hypertrophy. Left ventricular diastolic parameters were normal.  2. The right ventricle has normal systolic function. The cavity was normal. There is no increase in right ventricular wall thickness.  3. The mitral valve is grossly normal.  4. The tricuspid valve is grossly normal.  5. The aortic valve has an indeterminate number  of cusps. Aortic valve regurgitation is mild by color flow Doppler. __________   CTA chest/abd/pelvis: 1. No dissection.  No PE.  No aortic aneurysm. 2. Atherosclerotic changes are noted throughout the thoracic and abdominal aorta. 3. No acute intra-abdominal abnormality. 4. Severe sigmoid diverticulosis without CT evidence of diverticulitis. 5. Enlarged prostate gland. Bilateral fat containing inguinal hernias are noted.   Zio 05/2019: Normal sinus rhythm with an average heart rate of 73 bpm. 27 episodes of short SVTs.  The longest lasted 13 beats. __________   Coronary CTA/FFR: 1. Left Main: 0.98. 2. LAD: Ostial: 0.98, proximal: 0.50. 3. LCX: Proximal: 0.95, mid: 078. 4. OM1: 0.77. 5. RCA: Proximal: 0.98, mid: 0.82, PDA: 0.79.   IMPRESSION: 1. CT FFR analysis showed severe stenosis in the proximal LAD, mid RCA, mid LCX artery. A cardiac catheterization is recommended. __________   [facility] 06/2019: Conclusions: Severe single-vessel CAD with 80-90% proximal LAD stenosis with heavy calcification. Mild to moderate, non-obstructive CAD involving mid LAD, mid LCx, and mid RCA. Elevated LVEDP with normal left ventricular contraction consistent with diastolic dysfunction.   Recommendations: Plan for staged PCI to proximal LAD at [facility] using atherectomy.  Will start patient on dual antiplatelet therapy with aspirin and clopidogrel.  Long-term, transitioning Mr. Fick to NOAC + clopidogrel will need to be considered with his history of paroxysmal atrial fibrillation and CHADSVASC score of at least 2 (age + CAD). Aggressive secondary prevention. __________   PCI 06/2019: Mid LAD lesion is 55% stenosed. Prox  Cx to Mid Cx lesion is 40% stenosed. Mid RCA lesion is 30% stenosed. Prox LAD to Mid LAD lesion is 85% stenosed. 2nd Diag lesion is 50% stenosed. Post intervention, there is a 0% residual stenosis. A drug-eluting stent was successfully placed using a STENT RESOLUTE  EXBM8.4X32. Post intervention, there is a 0% residual stenosis. A drug-eluting stent was successfully placed using a STENT RESOLUTE ONYX 3.0X8. Post intervention, there is a 30% residual stenosis. Balloon angioplasty was performed using a BALLOON SAPPHIRE 2.5X20.   Successful complex orbital atherectomy and 2 overlapped drug-eluting stent placement to the mid and proximal LAD extending into the ostium with balloon angioplasty of the ostial second diagonal.  There was non-flow-limiting dissection in ostial diagonal with normal flow.  This did not require treatment.   Recommendations: Dual antiplatelet therapy for at least 1 year.  Aggressive treatment of risk factors.   EKG:  EKG is ordered today.  The EKG ordered today demonstrates NSR, 65 bpm, no acute ST-T changes  Recent Labs: 01/31/2024: TSH 3.300 03/09/2024: ALT 30; BUN 24; Creatinine, Ser 0.87; Hemoglobin 14.1; Platelets 162; Potassium 3.9; Sodium 138  Recent Lipid Panel    Component Value Date/Time   CHOL 138 03/09/2024 0841   CHOL 135 08/28/2020 0925   TRIG 121 03/09/2024 0841   HDL 43 03/09/2024 0841   HDL 46 08/28/2020 0925   CHOLHDL 3.2 03/09/2024 0841   VLDL 24 03/09/2024 0841   LDLCALC 71 03/09/2024 0841   LDLCALC 71 08/28/2020 0925    PHYSICAL EXAM:    VS:  BP 130/72   Pulse 72   Ht 6' (1.829 m)   Wt 210 lb 6.4 oz (95.4 kg)   SpO2 93%   BMI 28.54 kg/m   BMI: Body mass index is 28.54 kg/m.  Physical Exam Vitals reviewed.  Constitutional:      Appearance: He is well-developed.  HENT:     Head: Normocephalic and atraumatic.  Eyes:     General:        Right eye: No discharge.        Left eye: No discharge.  Cardiovascular:     Rate and Rhythm: Normal rate and regular rhythm.     Heart sounds: Normal heart sounds, S1 normal and S2 normal. Heart sounds not distant. No midsystolic click and no opening snap. No murmur heard.    No friction rub.  Pulmonary:     Effort: Pulmonary effort is normal. No  respiratory distress.     Breath sounds: Normal breath sounds. No decreased breath sounds, wheezing, rhonchi or rales.  Chest:     Chest wall: No tenderness.  Musculoskeletal:     Cervical back: Normal range of motion.  Skin:    General: Skin is warm and dry.     Nails: There is no clubbing.  Neurological:     Mental Status: He is alert and oriented to person, place, and time.  Psychiatric:        Speech: Speech normal.        Behavior: Behavior normal.        Thought Content: Thought content normal.        Judgment: Judgment normal.     Wt Readings from Last 3 Encounters:  03/13/24 210 lb 6.4 oz (95.4 kg)  01/31/24 208 lb (94.3 kg)  10/29/23 199 lb (90.3 kg)     ASSESSMENT & PLAN:   CAD involving native coronary arteries with stable angina: He is doing well and without symptoms concerning  for angina or cardiac decompensation.  LHC less than 12 months ago showed patent LAD stents with stable disease involving the LCx and RCA.  Continue aggressive risk factor modification and secondary prevention including aspirin, clopidogrel, diltiazem, and ezetimibe.  We will undergo a trial of discontinuing rosuvastatin as outlined below to see if this improves his lower extremity fatigue.  No indication for further ischemic testing at this time.  PAF/PSVT: Quiescent.  He remains on Cardizem.  Aortic insufficiency with borderline dilatation of the ascending aorta: Echo in 05/2023 showed mild to moderate aortic valve insufficiency with borderline dilatation of the ascending aorta measuring 38 mm.  Repeat echo in 05/2024.  If there is significant progression of ascending aortic dilatation, consider cross-sectional imaging.  Diastolic dysfunction: Euvolemic, compensated.  Not requiring a standing loop diuretic.  HLD with familial hyperlipidemia: LDL 71 in 03/2024.  We will undergo a trial of discontinuing rosuvastatin as outlined below.  He remains on ezetimibe.  Lower extremity fatigue: Trial of  discontinuing rosuvastatin.  He will update [place] in 2 weeks.    Disposition: F/u with Dr. Kirke Corin or an APP in 6 months.   Medication Adjustments/Labs and Tests Ordered: Current medicines are reviewed at length with the patient today.  Concerns regarding medicines are outlined above. Medication changes, Labs and Tests ordered today are summarized above and listed in the Patient Instructions accessible in Encounters.   Signed, Eula Listen, PA-C 03/13/2024 5:22 PM     [facility] [place] [address] [place], [place] [address] [phone]

## 2024-03-31 ENCOUNTER — Other Ambulatory Visit: Payer: Self-pay | Admitting: Cardiovascular Disease

## 2024-03-31 DIAGNOSIS — R079 Chest pain, unspecified: Secondary | ICD-10-CM

## 2024-04-12 ENCOUNTER — Ambulatory Visit: Payer: Medicare Other | Admitting: Dermatology

## 2024-04-12 ENCOUNTER — Encounter: Payer: Self-pay | Admitting: Dermatology

## 2024-04-12 DIAGNOSIS — D229 Melanocytic nevi, unspecified: Secondary | ICD-10-CM

## 2024-04-12 DIAGNOSIS — D1801 Hemangioma of skin and subcutaneous tissue: Secondary | ICD-10-CM

## 2024-04-12 DIAGNOSIS — I781 Nevus, non-neoplastic: Secondary | ICD-10-CM

## 2024-04-12 DIAGNOSIS — I8393 Asymptomatic varicose veins of bilateral lower extremities: Secondary | ICD-10-CM

## 2024-04-12 DIAGNOSIS — L578 Other skin changes due to chronic exposure to nonionizing radiation: Secondary | ICD-10-CM | POA: Diagnosis not present

## 2024-04-12 DIAGNOSIS — L821 Other seborrheic keratosis: Secondary | ICD-10-CM

## 2024-04-12 DIAGNOSIS — L820 Inflamed seborrheic keratosis: Secondary | ICD-10-CM

## 2024-04-12 DIAGNOSIS — L814 Other melanin hyperpigmentation: Secondary | ICD-10-CM | POA: Diagnosis not present

## 2024-04-12 DIAGNOSIS — Z1283 Encounter for screening for malignant neoplasm of skin: Secondary | ICD-10-CM

## 2024-04-12 DIAGNOSIS — Z85820 Personal history of malignant melanoma of skin: Secondary | ICD-10-CM

## 2024-04-12 DIAGNOSIS — L570 Actinic keratosis: Secondary | ICD-10-CM | POA: Diagnosis not present

## 2024-04-12 DIAGNOSIS — W908XXA Exposure to other nonionizing radiation, initial encounter: Secondary | ICD-10-CM

## 2024-04-12 DIAGNOSIS — R234 Changes in skin texture: Secondary | ICD-10-CM

## 2024-04-12 DIAGNOSIS — Z85828 Personal history of other malignant neoplasm of skin: Secondary | ICD-10-CM

## 2024-04-12 NOTE — Progress Notes (Signed)
 Follow-Up Visit   Subjective  [name] [name] is a 73 y.o. male who presents for the following: Skin Cancer Screening and Full Body Skin Exam  The patient presents for Total-Body Skin Exam (TBSE) for skin cancer screening and mole check. The patient has spots, moles and lesions to be evaluated, some may be new or changing and the patient may have concern these could be cancer.   The following portions of the chart were reviewed this encounter and updated as appropriate: medications, allergies, medical history  Review of Systems:  No other skin or systemic complaints except as noted in HPI or Assessment and Plan.  Objective  Well appearing patient in no apparent distress; mood and affect are within normal limits.  A full examination was performed including scalp, head, eyes, ears, nose, lips, neck, chest, axillae, abdomen, back, buttocks, bilateral upper extremities, bilateral lower extremities, hands, feet, fingers, toes, fingernails, and toenails. All findings within normal limits unless otherwise noted below.   Relevant physical exam findings are noted in the Assessment and Plan.  R lat elbow x 1, R temple x 1 Erythematous stuck-on, waxy papule or plaque R forehead x 2, L temple x 1 (3) Erythematous thin papules/macules with gritty scale.   Assessment & Plan   SKIN CANCER SCREENING PERFORMED TODAY.  ACTINIC DAMAGE - Chronic condition, secondary to cumulative UV/sun exposure - diffuse scaly erythematous macules with underlying dyspigmentation - Recommend daily broad spectrum sunscreen SPF 30+ to sun-exposed areas, reapply every 2 hours as needed.  - Staying in the shade or wearing long sleeves, sun glasses (UVA+UVB protection) and wide brim hats (4-inch brim around the entire circumference of the hat) are also recommended for sun protection.  - Call for new or changing lesions.  LENTIGINES, SEBORRHEIC KERATOSES, HEMANGIOMAS - Benign normal skin lesions - Benign-appearing -  Call for any changes  MELANOCYTIC NEVI - Tan-brown and/or pink-flesh-colored symmetric macules and papules - Benign appearing on exam today - Observation - Call clinic for new or changing moles - Recommend daily use of broad spectrum spf 30+ sunscreen to sun-exposed areas.   HISTORY OF MELANOMA - L shoulder, 1984 - No evidence of recurrence today - No lymphadenopathy - Recommend regular full body skin exams - Recommend daily broad spectrum sunscreen SPF 30+ to sun-exposed areas, reapply every 2 hours as needed.  - Call if any new or changing lesions are noted between office visits  HISTORY OF BASAL CELL CARCINOMA OF THE SKIN - L dorsal forearm, L med calf  - No evidence of recurrence today - Recommend regular full body skin exams - Recommend daily broad spectrum sunscreen SPF 30+ to sun-exposed areas, reapply every 2 hours as needed.  - Call if any new or changing lesions are noted between office visits  Varicose Veins/Spider Veins - Dilated blue, purple or red veins at the lower extremities - Reassured - Smaller vessels can be treated by sclerotherapy (a procedure to inject a medicine into the veins to make them disappear) if desired, but the treatment is not covered by insurance. Larger vessels may be covered if symptomatic and we would refer to vascular surgeon if treatment desired. INFLAMED SEBORRHEIC KERATOSIS R lat elbow x 1, R temple x 1 Symptomatic, irritating, patient would like treated.  Destruction of lesion - R lat elbow x 1, R temple x 1 Complexity: simple   Destruction method: cryotherapy   Informed consent: discussed and consent obtained   Timeout:  patient name, date of birth, surgical site, and procedure  verified Lesion destroyed using liquid nitrogen: Yes   Region frozen until ice ball extended beyond lesion: Yes   Outcome: patient tolerated procedure well with no complications   Post-procedure details: wound care instructions given   AK (ACTINIC KERATOSIS)  (3) R forehead x 2, L temple x 1 (3) Actinic keratoses are precancerous spots that appear secondary to cumulative UV radiation exposure/sun exposure over time. They are chronic with expected duration over 1 year. A portion of actinic keratoses will progress to squamous cell carcinoma of the skin. It is not possible to reliably predict which spots will progress to skin cancer and so treatment is recommended to prevent development of skin cancer.  Recommend daily broad spectrum sunscreen SPF 30+ to sun-exposed areas, reapply every 2 hours as needed.  Recommend staying in the shade or wearing long sleeves, sun glasses (UVA+UVB protection) and wide brim hats (4-inch brim around the entire circumference of the hat). Call for new or changing lesions.  Destruction of lesion - R forehead x 2, L temple x 1 (3) Complexity: simple   Destruction method: cryotherapy   Informed consent: discussed and consent obtained   Timeout:  patient name, date of birth, surgical site, and procedure verified Lesion destroyed using liquid nitrogen: Yes   Region frozen until ice ball extended beyond lesion: Yes   Outcome: patient tolerated procedure well with no complications   Post-procedure details: wound care instructions given    Crust due to trauma of R forehead - if not resolved by 6-8 weeks RTC  Return in about 1 year (around 04/12/2025) for TBSE - hx BCC, MM.  I, [name], CMA, am acting as scribe for Celine Collard, MD .  Documentation: I have reviewed the above documentation for accuracy and completeness, and I agree with the above.  Celine Collard, MD

## 2024-04-12 NOTE — Patient Instructions (Signed)

## 2024-05-08 ENCOUNTER — Encounter: Payer: Self-pay | Admitting: Family Medicine

## 2024-05-08 ENCOUNTER — Ambulatory Visit (INDEPENDENT_AMBULATORY_CARE_PROVIDER_SITE_OTHER): Payer: Medicare Other | Admitting: Family Medicine

## 2024-05-08 VITALS — BP 130/61 | HR 72 | Ht 72.0 in | Wt 209.0 lb

## 2024-05-08 DIAGNOSIS — M62838 Other muscle spasm: Secondary | ICD-10-CM | POA: Diagnosis not present

## 2024-05-08 DIAGNOSIS — R5383 Other fatigue: Secondary | ICD-10-CM

## 2024-05-08 DIAGNOSIS — R351 Nocturia: Secondary | ICD-10-CM

## 2024-05-08 DIAGNOSIS — N401 Enlarged prostate with lower urinary tract symptoms: Secondary | ICD-10-CM

## 2024-05-08 DIAGNOSIS — R350 Frequency of micturition: Secondary | ICD-10-CM | POA: Diagnosis not present

## 2024-05-08 NOTE — Progress Notes (Signed)
 Established patient visit   Patient: [name] [name]   DOB: 1950/12/23   73 y.o. Male  MRN: [id number] Visit Date: 05/08/2024  Today's healthcare provider: Mimi Alt, MD   Chief Complaint  Patient presents with   Follow-up    PSA labs done (symptoms urine frequency, tiredness) spasms in L breast     Subjective     HPI     Follow-up    Additional comments: PSA labs done (symptoms urine frequency, tiredness) spasms in L breast        Last edited by Bart Lieu, CMA on 05/08/2024  1:37 PM.       Discussed the use of AI scribe software for clinical note transcription with the patient, who gave verbal consent to proceed.  History of Present Illness [name] [name] "[name] [name]" is a 73 year old male with coronary artery disease and hypothyroidism who presents with urinary frequency and fatigue.  He experiences urinary frequency, needing to urinate two to three times a night. He has a history of an enlarged prostate and previously tried Flomax , which he could not tolerate due to side effects such as dizziness. He has not had a PSA test in the last five years and is concerned about prostate health.  He describes persistent fatigue, feeling more tired after discontinuing rosuvastatin , which he has since resumed. He requires more sleep than before, about nine hours, but does not feel rested. He attributes some of the fatigue to frequent nighttime urination. He has a history of coronary artery disease and underwent a left heart catheterization and stent placement in the LAD. He also has paroxysmal supraventricular tachycardia and aortic insufficiency.  He experiences muscle spasms in the left pectoral area, which are more noticeable at night or when activating the pectoral muscles. He associates these spasms with tiredness and notes they are not painful.  He has a history of hypothyroidism, managed with 25 micrograms of Synthroid  daily. Recent labs showed a TSH of 3.3  within normal limits. He also had a history of high calcium  levels, which normalized after reducing vitamin D  intake. His vitamin D  level was previously 104 and is now 44.  He mentions erectile dysfunction and has a history of taking various vitamins, which he has paused due to high vitamin D  levels. He plans to resume a multivitamin without excessive vitamin D .  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 He maintains a consistent sleep schedule, going to bed around 10 PM and waking up between 6:30 and 7:30 AM.     Past Medical History:  Diagnosis Date   Acquired hypothyroidism    Aortic atherosclerosis (HCC)    Basal cell carcinoma 08/13/2022   Left dorsal forearm. Superficial and nodular. Tx with [facility] 09/17/22   Basal cell carcinoma 03/24/2023   Left medial calf. Superficial BCC with focal infiltration. [facility] 04/29/23   Coronary artery disease    a.) LHC 06/26/2019: EF 55-60%; LVEDP 25-30%; 80 pLAD, 40% mLAD, 40% p-mLCx, 30% mRCA; staged PCI planned. b.) LHC/staged PCI 07/05/2019: 55% mLAD, 40% p-mLCx, 30% mRCA, 85% p-mLAD, 50% D2 --> orbital atherectomy and PCI performed placing overlapping 3.0 x 8 mm (pLAD) and 3.0 x 30 mm (mLAD) Resolute Onyx DES x 2   Diverticulosis    ED (erectile dysfunction)    a.) on PDE5i (tadalifil)   Elevated LFTs    GERD (gastroesophageal reflux disease)    HLD (hyperlipidemia)    Hypertension  Long term current use of antithrombotics/antiplatelets    a.) daily DAPT therapy (ASA + clopidogrel )   Melanoma (HCC)    Left shoulder. 1984   PAF (paroxysmal atrial fibrillation) (HCC) 2004   a.) CHA2DS2-VASc = 3 (age, HTN, aortic plaque). b.) rate/rhythm maintained on oral diltiazem ; on daily DAPT (ASA + clopidogrel )   Paroxysmal SVT (supraventricular tachycardia) (HCC)    Pure hypercholesterolemia    Skin cancer    Left forearm. NMSC. Removed 2008 or prior    Medications: Outpatient Medications Prior to Visit   Medication Sig   acetaminophen  (TYLENOL ) 500 MG tablet Take 500-1,000 mg by mouth every 6 (six) hours as needed (pain.).   aspirin  EC 81 MG tablet Take 81 mg by mouth in the morning.   cetirizine (ZYRTEC) 10 MG tablet Take 10 mg by mouth at bedtime.    clopidogrel  (PLAVIX ) 75 MG tablet Take 1 tablet (75 mg total) by mouth daily.   diltiazem  (CARDIZEM  CD) 120 MG 24 hr capsule TAKE 1 CAPSULE BY MOUTH IN THE MORNING AND 1 AT BEDTIME   ezetimibe  (ZETIA ) 10 MG tablet TAKE 1 TABLET BY MOUTH ONCE DAILY IN THE EVENING   famotidine (PEPCID) 20 MG tablet Take 20 mg by mouth daily before supper.   fluticasone (FLONASE) 50 MCG/ACT nasal spray Place 1 spray into both nostrils in the morning.   levothyroxine  (SYNTHROID ) 25 MCG tablet Take 1 tablet (25 mcg total) by mouth daily before breakfast.   Misc Natural Products (OSTEO BI-FLEX TRIPLE STRENGTH PO) Take 2 tablets by mouth in the morning.   Multiple Vitamin (MULTIVITAMIN WITH MINERALS) TABS tablet Take 1 tablet by mouth in the morning.   nitroGLYCERIN  (NITROSTAT ) 0.4 MG SL tablet Place 1 tablet (0.4 mg total) under the tongue every 5 (five) minutes as needed for chest pain.   Nutritional Supplements (FRUIT & VEGETABLE DAILY) CAPS Take 6 capsules by mouth in the morning. Balance of Nature Fruits (3) & Veggies (3)   rosuvastatin  (CRESTOR ) 40 MG tablet Take 1 tablet by mouth once daily   sildenafil  (VIAGRA ) 100 MG tablet Take 1 tablet (100 mg total) by mouth daily as needed for erectile dysfunction. Take two hours prior to intercourse on an empty stomach   Turmeric 500 MG CAPS Take 500 mg by mouth in the morning.   VITAMIN D , CHOLECALCIFEROL, PO Take 1 tablet by mouth in the morning.   No facility-administered medications prior to visit.    Review of Systems  Last CBC Lab Results  Component Value Date   WBC 5.3 03/09/2024   HGB 14.1 03/09/2024   HCT 41.4 03/09/2024   MCV 89.2 03/09/2024   MCH 30.4 03/09/2024   RDW 13.4 03/09/2024   PLT 162  03/09/2024   Last metabolic panel Lab Results  Component Value Date   GLUCOSE 102 (H) 03/09/2024   NA 138 03/09/2024   K 3.9 03/09/2024   CL 107 03/09/2024   CO2 24 03/09/2024   BUN 24 (H) 03/09/2024   CREATININE 0.87 03/09/2024   GFRNONAA >60 03/09/2024   CALCIUM  10.0 03/09/2024   PROT 6.9 03/09/2024   ALBUMIN 4.0 03/09/2024   LABGLOB 2.3 01/31/2024   AGRATIO 1.9 08/28/2020   BILITOT 0.5 03/09/2024   ALKPHOS 48 03/09/2024   AST 29 03/09/2024   ALT 30 03/09/2024   ANIONGAP 7 03/09/2024   Last lipids Lab Results  Component Value Date   CHOL 138 03/09/2024   HDL 43 03/09/2024   LDLCALC 71 03/09/2024   TRIG 121  03/09/2024   CHOLHDL 3.2 03/09/2024   Last hemoglobin A1c No results found for: "HGBA1C" Last thyroid  functions Lab Results  Component Value Date   TSH 3.300 01/31/2024   Last vitamin D  Lab Results  Component Value Date   VD25OH 104.0 (H) 02/03/2024   Last vitamin B12 and Folate No results found for: "VITAMINB12", "FOLATE"      Objective    BP 130/61   Pulse 72   Ht 6' (1.829 m)   Wt 209 lb (94.8 kg)   SpO2 98%   BMI 28.35 kg/m  BP Readings from Last 3 Encounters:  05/08/24 130/61  03/13/24 130/72  01/31/24 (!) 133/59   Wt Readings from Last 3 Encounters:  05/08/24 209 lb (94.8 kg)  03/13/24 210 lb 6.4 oz (95.4 kg)  01/31/24 208 lb (94.3 kg)        Physical Exam  General: Alert, no acute distress Cardio: Normal S1 and S2, RRR, no r/m/g Pulm: CTAB, normal work of breathing ABD: soft, abdomen is not distended, there is no tenderness to palpation, normal BS  Extremities: no LE edema    No results found for any visits on 05/08/24.  Assessment & Plan     Problem List Items Addressed This Visit       Other   Benign prostatic hyperplasia with urinary frequency   Relevant Orders   PSA, total and free   Other Visit Diagnoses       Nocturia more than twice per night    -  Primary   Relevant Orders   PSA, total and free      Muscle spasm         Other fatigue       Relevant Orders   PSA, total and free   TSH+T4F+T3Free   CBC   CMP14+EGFR   Testosterone        Assessment & Plan Fatigue Chronic fatigue with no clear etiology. Possible contributing factors include hypothyroidism, enlarged prostate, and age-related changes. Reports non-restorative sleep and increased fatigue after holding rosuvastatin . Erectile dysfunction may be related to fatigue or other underlying conditions. - Order CBC and CMP to evaluate for anemia and other potential causes of fatigue - Order testosterone level to assess for hypogonadism - Consider sleep study if fatigue persists after initial workup  Hypothyroidism Managed with Synthroid  25 mcg daily. TSH was within normal limits in February 2025. Persistent fatigue warrants further evaluation. - Order TSH, T4, and T3 to reassess thyroid  function  Coronary artery disease with angina Coronary artery disease with previous LAD stents. Angina managed with aspirin , Plavix , and diltiazem . Increased fatigue after holding rosuvastatin , which was restarted.  Paroxysmal supraventricular tachycardia Managed with diltiazem . No recent episodes reported.  Aortic insufficiency Aortic insufficiency without acute changes.  Enlarged prostate with urinary frequency Enlarged prostate with urinary frequency, urinating 2-3 times per night. Previous trial of Flomax  resulted in intolerable dizziness. Desires PSA testing for peace of mind regarding prostate health. - Order PSA test     No follow-ups on file.         Mimi Alt, MD  [facility] [phone] (phone) [phone] (fax)  [facility]

## 2024-05-09 ENCOUNTER — Ambulatory Visit: Payer: Self-pay | Admitting: Family Medicine

## 2024-05-09 DIAGNOSIS — R5383 Other fatigue: Secondary | ICD-10-CM

## 2024-05-09 LAB — CBC
Hematocrit: 44.9 % (ref 37.5–51.0)
Hemoglobin: 14.8 g/dL (ref 13.0–17.7)
MCH: 30.1 pg (ref 26.6–33.0)
MCHC: 33 g/dL (ref 31.5–35.7)
MCV: 91 fL (ref 79–97)
Platelets: 187 10*3/uL (ref 150–450)
RBC: 4.91 x10E6/uL (ref 4.14–5.80)
RDW: 13.4 % (ref 11.6–15.4)
WBC: 5.7 10*3/uL (ref 3.4–10.8)

## 2024-05-09 LAB — TSH+T4F+T3FREE
Free T4: 1.29 ng/dL (ref 0.82–1.77)
T3, Free: 3 pg/mL (ref 2.0–4.4)
TSH: 2.99 u[IU]/mL (ref 0.450–4.500)

## 2024-05-09 LAB — CMP14+EGFR
ALT: 26 IU/L (ref 0–44)
AST: 25 IU/L (ref 0–40)
Albumin: 4.7 g/dL (ref 3.8–4.8)
Alkaline Phosphatase: 75 IU/L (ref 44–121)
BUN/Creatinine Ratio: 19 (ref 10–24)
BUN: 18 mg/dL (ref 8–27)
Bilirubin Total: 0.4 mg/dL (ref 0.0–1.2)
CO2: 21 mmol/L (ref 20–29)
Calcium: 10.9 mg/dL — ABNORMAL HIGH (ref 8.6–10.2)
Chloride: 104 mmol/L (ref 96–106)
Creatinine, Ser: 0.93 mg/dL (ref 0.76–1.27)
Globulin, Total: 2.2 g/dL (ref 1.5–4.5)
Glucose: 91 mg/dL (ref 70–99)
Potassium: 4.1 mmol/L (ref 3.5–5.2)
Sodium: 141 mmol/L (ref 134–144)
Total Protein: 6.9 g/dL (ref 6.0–8.5)
eGFR: 86 mL/min/{1.73_m2} (ref >59)

## 2024-05-09 LAB — PSA, TOTAL AND FREE
PSA, Free Pct: 37 %
PSA, Free: 0.37 ng/mL
Prostate Specific Ag, Serum: 1 ng/mL (ref 0.0–4.0)

## 2024-05-09 LAB — TESTOSTERONE: Testosterone: 243 ng/dL — ABNORMAL LOW (ref 264–916)

## 2024-05-19 DIAGNOSIS — H903 Sensorineural hearing loss, bilateral: Secondary | ICD-10-CM | POA: Diagnosis not present

## 2024-05-25 DIAGNOSIS — R5383 Other fatigue: Secondary | ICD-10-CM | POA: Diagnosis not present

## 2024-05-26 LAB — CMP14+EGFR
ALT: 27 IU/L (ref 0–44)
AST: 26 IU/L (ref 0–40)
Albumin: 4.6 g/dL (ref 3.8–4.8)
Alkaline Phosphatase: 72 IU/L (ref 44–121)
BUN/Creatinine Ratio: 18 (ref 10–24)
BUN: 18 mg/dL (ref 8–27)
Bilirubin Total: 0.4 mg/dL (ref 0.0–1.2)
CO2: 19 mmol/L — ABNORMAL LOW (ref 20–29)
Calcium: 10.4 mg/dL — ABNORMAL HIGH (ref 8.6–10.2)
Chloride: 105 mmol/L (ref 96–106)
Creatinine, Ser: 0.99 mg/dL (ref 0.76–1.27)
Globulin, Total: 2.2 g/dL (ref 1.5–4.5)
Glucose: 107 mg/dL — ABNORMAL HIGH (ref 70–99)
Potassium: 3.9 mmol/L (ref 3.5–5.2)
Sodium: 140 mmol/L (ref 134–144)
Total Protein: 6.8 g/dL (ref 6.0–8.5)
eGFR: 80 mL/min/{1.73_m2} (ref >59)

## 2024-05-30 DIAGNOSIS — H2512 Age-related nuclear cataract, left eye: Secondary | ICD-10-CM | POA: Diagnosis not present

## 2024-05-30 DIAGNOSIS — H18413 Arcus senilis, bilateral: Secondary | ICD-10-CM | POA: Diagnosis not present

## 2024-05-30 DIAGNOSIS — H25043 Posterior subcapsular polar age-related cataract, bilateral: Secondary | ICD-10-CM | POA: Diagnosis not present

## 2024-05-30 DIAGNOSIS — H2513 Age-related nuclear cataract, bilateral: Secondary | ICD-10-CM | POA: Diagnosis not present

## 2024-05-30 DIAGNOSIS — H40013 Open angle with borderline findings, low risk, bilateral: Secondary | ICD-10-CM | POA: Diagnosis not present

## 2024-05-30 DIAGNOSIS — H35341 Macular cyst, hole, or pseudohole, right eye: Secondary | ICD-10-CM | POA: Diagnosis not present

## 2024-05-30 DIAGNOSIS — H35372 Puckering of macula, left eye: Secondary | ICD-10-CM | POA: Diagnosis not present

## 2024-06-30 ENCOUNTER — Other Ambulatory Visit: Payer: Self-pay | Admitting: Physician Assistant

## 2024-07-11 ENCOUNTER — Telehealth

## 2024-07-11 DIAGNOSIS — J208 Acute bronchitis due to other specified organisms: Secondary | ICD-10-CM | POA: Diagnosis not present

## 2024-07-11 MED ORDER — PREDNISONE 20 MG PO TABS: 40.0000 mg | ORAL_TABLET | Freq: Every day | ORAL | 0 refills | Status: AC

## 2024-07-11 NOTE — Progress Notes (Signed)
 Virtual Visit Consent   [name] [name], you are scheduled for a virtual visit with a [facility] provider today. Just as with appointments in the office, your consent must be obtained to participate. Your consent will be active for this visit and any virtual visit you may have with one of our providers in the next 365 days. If you have a MyChart account, a copy of this consent can be sent to you electronically.  As this is a virtual visit, video technology does not allow for your provider to perform a traditional examination. This may limit your provider's ability to fully assess your condition. If your provider identifies any concerns that need to be evaluated in person or the need to arrange testing (such as labs, EKG, etc.), we will make arrangements to do so. Although advances in technology are sophisticated, we cannot ensure that it will always work on either your end or our end. If the connection with a video visit is poor, the visit may have to be switched to a telephone visit. With either a video or telephone visit, we are not always able to ensure that we have a secure connection.  By engaging in this virtual visit, you consent to the provision of healthcare and authorize for your insurance to be billed (if applicable) for the services provided during this visit. Depending on your insurance coverage, you may receive a charge related to this service.  I need to obtain your verbal consent now. Are you willing to proceed with your visit today? [name] [name] has provided verbal consent on 07/11/2024 for a virtual visit (video or telephone). [name] CHRISTELLA Barefoot, NP  Date: 07/11/2024 11:03 AM   Virtual Visit via Video Note   I, [name] [name], connected with  [name] [name]  ([id number], 06/28/1951) on 07/11/24 at 11:00 AM EDT by a video-enabled telemedicine application and verified that I am speaking with the correct person using two identifiers.  Location: Patient: Virtual Visit Location Patient:  Home Provider: Virtual Visit Location Provider: Home Office   I discussed the limitations of evaluation and management by telemedicine and the availability of in person appointments. The patient expressed understanding and agreed to proceed.    History of Present Illness: [name] [name] is a 73 y.o. who identifies as a male who was assigned male at birth, and is being seen today for cough  Onset was 2 weeks ago- has a chronic cough and tickle in throat. Reports that last night he noted that he was having a cough fit that would not stop coughing. Mucus- clear at times, and turning some tan as times.  Felt like a summer cold. Acid reflux for a few months- and has been propping himself up.  Modifying factors are mucinex, and daily meds Denies chest pain, shortness of breath, fevers, chills, headache, sore throat, ear pain  Exposure to sick contacts- unknown COVID test: no  Problems:  Patient Active Problem List   Diagnosis Date Noted   Encounter for subsequent annual wellness visit (AWV) in Medicare patient 04/04/2023   Annual physical exam 04/04/2023   Hearing loss 04/04/2023   COVID-19 06/02/2022   Acute pain of right shoulder 11/12/2021   Hypothyroidism 11/12/2021   Gastroesophageal reflux disease 11/12/2021   Atherosclerotic heart disease of native coronary artery with other forms of angina pectoris (HCC) 11/12/2021   Erectile dysfunction due to arterial insufficiency 10/16/2020   Benign prostatic hyperplasia with urinary frequency 10/16/2020   Left inguinal hernia 03/25/2020   SVT (supraventricular tachycardia) (HCC)  07/06/2019   NSVT (nonsustained ventricular tachycardia) (HCC) 07/06/2019   Status post coronary artery stent placement    Hyperlipidemia LDL goal <70    Effort angina (HCC) 07/05/2019   Accelerating angina (HCC) 06/20/2019   Acquired hypothyroidism 04/02/2016   Paroxysmal atrial fibrillation (HCC) 12/14/2014   Dyslipidemia 12/14/2014    Allergies:  Allergies   Allergen Reactions   Hydrocodone-Acetaminophen  Nausea And Vomiting and Other (See Comments)   Vicodin [Hydrocodone-Acetaminophen ] Nausea And Vomiting   Medications:  Current Outpatient Medications:    acetaminophen  (TYLENOL ) 500 MG tablet, Take 500-1,000 mg by mouth every 6 (six) hours as needed (pain.)., Disp: , Rfl:    aspirin  EC 81 MG tablet, Take 81 mg by mouth in the morning., Disp: , Rfl:    cetirizine (ZYRTEC) 10 MG tablet, Take 10 mg by mouth at bedtime. , Disp: , Rfl:    clopidogrel  (PLAVIX ) 75 MG tablet, Take 1 tablet by mouth once daily, Disp: 90 tablet, Rfl: 0   diltiazem  (CARDIZEM  CD) 120 MG 24 hr capsule, TAKE 1 CAPSULE BY MOUTH IN THE MORNING AND 1 AT BEDTIME, Disp: 180 capsule, Rfl: 3   ezetimibe  (ZETIA ) 10 MG tablet, TAKE 1 TABLET BY MOUTH ONCE DAILY IN THE EVENING, Disp: 90 tablet, Rfl: 3   famotidine (PEPCID) 20 MG tablet, Take 20 mg by mouth daily before supper., Disp: , Rfl:    fluticasone (FLONASE) 50 MCG/ACT nasal spray, Place 1 spray into both nostrils in the morning., Disp: , Rfl:    levothyroxine  (SYNTHROID ) 25 MCG tablet, Take 1 tablet (25 mcg total) by mouth daily before breakfast., Disp: 90 tablet, Rfl: 1   Misc Natural Products (OSTEO BI-FLEX TRIPLE STRENGTH PO), Take 2 tablets by mouth in the morning., Disp: , Rfl:    Multiple Vitamin (MULTIVITAMIN WITH MINERALS) TABS tablet, Take 1 tablet by mouth in the morning., Disp: , Rfl:    nitroGLYCERIN  (NITROSTAT ) 0.4 MG SL tablet, Place 1 tablet (0.4 mg total) under the tongue every 5 (five) minutes as needed for chest pain., Disp: 25 tablet, Rfl: 3   Nutritional Supplements (FRUIT & VEGETABLE DAILY) CAPS, Take 6 capsules by mouth in the morning. Balance of Nature Fruits (3) & Veggies (3), Disp: , Rfl:    rosuvastatin  (CRESTOR ) 40 MG tablet, Take 1 tablet by mouth once daily, Disp: 90 tablet, Rfl: 3   sildenafil  (VIAGRA ) 100 MG tablet, Take 1 tablet (100 mg total) by mouth daily as needed for erectile dysfunction. Take  two hours prior to intercourse on an empty stomach, Disp: 30 tablet, Rfl: 3   Turmeric 500 MG CAPS, Take 500 mg by mouth in the morning., Disp: , Rfl:    VITAMIN D , CHOLECALCIFEROL, PO, Take 1 tablet by mouth in the morning., Disp: , Rfl:   Observations/Objective: Patient is well-developed, well-nourished in no acute distress.  Resting comfortably  at home.  Head is normocephalic, atraumatic.  No labored breathing.  Speech is clear and coherent with logical content.  Patient is alert and oriented at baseline.  Cough persistent   Assessment and Plan:  1. Viral bronchitis (Primary)  - predniSONE  (DELTASONE ) 20 MG tablet; Take 2 tablets (40 mg total) by mouth daily with breakfast for 5 days.  Dispense: 10 tablet; Refill: 0   - Take meds as prescribed - Rest voice - Use a cool mist humidifier especially during the winter months when heat dries out the air. - Use saline nose sprays frequently to help soothe nasal passages if they are drying out. -  Stay hydrated by drinking plenty of fluids - Keep thermostat turn down low to prevent drying out which can cause a dry cough. - For any cough or congestion- robitussin DM or Delsym as needed - For fever or aches or pains- take tylenol  or ibuprofen as directed on bottle             * for fevers greater than 101 orally you may alternate ibuprofen and tylenol  every 3 hours.  If you do not improve you will need a follow up visit in person.                 Reviewed side effects, risks and benefits of medication.    Patient acknowledged agreement and understanding of the plan.   Past Medical, Surgical, Social History, Allergies, and Medications have been Reviewed.    Follow Up Instructions: I discussed the assessment and treatment plan with the patient. The patient was provided an opportunity to ask questions and all were answered. The patient agreed with the plan and demonstrated an understanding of the instructions.  A copy of  instructions were sent to the patient via MyChart unless otherwise noted below.    The patient was advised to call back or seek an in-person evaluation if the symptoms worsen or if the condition fails to improve as anticipated.    [name] CHRISTELLA Barefoot, NP

## 2024-07-11 NOTE — Patient Instructions (Signed)
 [name], thank you for joining [name], NP for today's virtual visit.  While this provider is not your primary care provider (PCP), if your PCP is located in our provider database this encounter information will be shared with them immediately following your visit.   A [facility] MyChart account gives you access to today's visit and all your visits, tests, and labs performed at [facility]  click here if you don't have a [facility] MyChart account or go to [email or web address]  Consent: (Patient) [name] [name] provided verbal consent for this virtual visit at the beginning of the encounter.  Current Medications:  Current Outpatient Medications:    predniSONE  (DELTASONE ) 20 MG tablet, Take 2 tablets (40 mg total) by mouth daily with breakfast for 5 days., Disp: 10 tablet, Rfl: 0   acetaminophen  (TYLENOL ) 500 MG tablet, Take 500-1,000 mg by mouth every 6 (six) hours as needed (pain.)., Disp: , Rfl:    aspirin  EC 81 MG tablet, Take 81 mg by mouth in the morning., Disp: , Rfl:    cetirizine (ZYRTEC) 10 MG tablet, Take 10 mg by mouth at bedtime. , Disp: , Rfl:    clopidogrel  (PLAVIX ) 75 MG tablet, Take 1 tablet by mouth once daily, Disp: 90 tablet, Rfl: 0   diltiazem  (CARDIZEM  CD) 120 MG 24 hr capsule, TAKE 1 CAPSULE BY MOUTH IN THE MORNING AND 1 AT BEDTIME, Disp: 180 capsule, Rfl: 3   ezetimibe  (ZETIA ) 10 MG tablet, TAKE 1 TABLET BY MOUTH ONCE DAILY IN THE EVENING, Disp: 90 tablet, Rfl: 3   famotidine (PEPCID) 20 MG tablet, Take 20 mg by mouth daily before supper., Disp: , Rfl:    fluticasone (FLONASE) 50 MCG/ACT nasal spray, Place 1 spray into both nostrils in the morning., Disp: , Rfl:    levothyroxine  (SYNTHROID ) 25 MCG tablet, Take 1 tablet (25 mcg total) by mouth daily before breakfast., Disp: 90 tablet, Rfl: 1   Misc Natural Products (OSTEO BI-FLEX TRIPLE STRENGTH PO), Take 2 tablets by mouth in the morning., Disp: , Rfl:    Multiple Vitamin (MULTIVITAMIN  WITH MINERALS) TABS tablet, Take 1 tablet by mouth in the morning., Disp: , Rfl:    nitroGLYCERIN  (NITROSTAT ) 0.4 MG SL tablet, Place 1 tablet (0.4 mg total) under the tongue every 5 (five) minutes as needed for chest pain., Disp: 25 tablet, Rfl: 3   Nutritional Supplements (FRUIT & VEGETABLE DAILY) CAPS, Take 6 capsules by mouth in the morning. Balance of Nature Fruits (3) & Veggies (3), Disp: , Rfl:    rosuvastatin  (CRESTOR ) 40 MG tablet, Take 1 tablet by mouth once daily, Disp: 90 tablet, Rfl: 3   sildenafil  (VIAGRA ) 100 MG tablet, Take 1 tablet (100 mg total) by mouth daily as needed for erectile dysfunction. Take two hours prior to intercourse on an empty stomach, Disp: 30 tablet, Rfl: 3   Turmeric 500 MG CAPS, Take 500 mg by mouth in the morning., Disp: , Rfl:    VITAMIN D , CHOLECALCIFEROL, PO, Take 1 tablet by mouth in the morning., Disp: , Rfl:    Medications ordered in this encounter:  Meds ordered this encounter  Medications   predniSONE  (DELTASONE ) 20 MG tablet    Sig: Take 2 tablets (40 mg total) by mouth daily with breakfast for 5 days.    Dispense:  10 tablet    Refill:  0    Supervising Provider:   BLAISE ALEENE KIDD [phone]     *If you need refills on other medications prior  to your next appointment, please contact your pharmacy*  Follow-Up: Call back or seek an in-person evaluation if the symptoms worsen or if the condition fails to improve as anticipated.  [facility] [phone]  Other Instructions  - Take meds as prescribed - Rest voice - Use a cool mist humidifier especially during the winter months when heat dries out the air. - Use saline nose sprays frequently to help soothe nasal passages if they are drying out. - Stay hydrated by drinking plenty of fluids - Keep thermostat turn down low to prevent drying out which can cause a dry cough. - For any cough or congestion- robitussin DM or Delsym as needed - For fever or aches or pains- take  tylenol  or ibuprofen as directed on bottle             * for fevers greater than 101 orally you may alternate ibuprofen and tylenol  every 3 hours.  If you do not improve you will need a follow up visit in person.                  If you have been instructed to have an in-person evaluation today at a local Urgent Care facility, please use the link below. It will take you to a list of all of our available [facility], including address, phone number and hours of operation. Please do not delay care.  [facility]  If you or a family member do not have a primary care provider, use the link below to schedule a visit and establish care. When you choose a [facility] primary care physician or advanced practice provider, you gain a long-term partner in health. Find a Primary Care Provider  Learn more about [facility]'s in-office and virtual care options: [place] - Get Care Now

## 2024-07-19 DIAGNOSIS — I351 Nonrheumatic aortic (valve) insufficiency: Secondary | ICD-10-CM

## 2024-07-19 DIAGNOSIS — I480 Paroxysmal atrial fibrillation: Secondary | ICD-10-CM

## 2024-07-28 ENCOUNTER — Telehealth: Payer: Self-pay | Admitting: *Deleted

## 2024-07-28 NOTE — Telephone Encounter (Signed)
   Pre-operative Risk Assessment    Patient Name: [name] [name]  DOB: [birth year] MRN: [id number]   Date of last office visit: 03/13/24 RYAN DUNN, [facility] Date of next office visit: 09/12/24 RYAN DUNN, [facility]   Request for Surgical Clearance    Procedure:  VITRECTOMY AND CATARACT REMOVAL VITREOUS OPACITIES AND CATARACTS   Date of Surgery:  Clearance 08/14/24                                Surgeon:  DR., SELINDA SLOCUMB Surgeon's Group or Practice Name:  [facility] Phone number:  [phone] Fax number:  [phone]   Type of Clearance Requested:   - Medical  - Pharmacy:  Hold Aspirin  and Clopidogrel  (Plavix )     Type of Anesthesia:  Not Indicated   Additional requests/questions:    Bonney Niels Jest   07/28/2024, 5:14 PM

## 2024-07-31 ENCOUNTER — Telehealth: Payer: Self-pay

## 2024-07-31 NOTE — Telephone Encounter (Signed)
 Appointment scheduled for 08/08/2024 @ 10:20. Med req and consent are complete. Call patient at [phone].

## 2024-07-31 NOTE — Telephone Encounter (Signed)
 Primary Cardiologist:Muhammad Darron, MD   Preoperative team, please contact this patient and set up a phone call appointment for further preoperative risk assessment. Please obtain consent and complete medication review. Thank you for your help.   Given request to hold DAPT, we will need to ensure no concerning cardiac symptoms. If patient is asymptomatic, per office protocol, he may hold Plavix  for 5 days prior to procedure and should resume as soon as hemodynamically stable postoperatively. Ideally aspirin  should be continued without interruption, however if the bleeding risk is too great, aspirin  may be held for 5-7 days prior to surgery. Please resume aspirin  post operatively when it is felt to be safe from a bleeding standpoint.   I also confirmed the patient resides in the state of [place] . As per [facility] [employer] Medical Board telemedicine laws, the patient must reside in the state in which the provider is licensed.   Rosaline EMERSON Bane, NP-C  07/31/2024, 8:30 AM [address] [place], [place] [address] Office [phone] Fax [phone]

## 2024-07-31 NOTE — Telephone Encounter (Signed)
  Patient Consent for Virtual Visit         [name] [name] has provided verbal consent on 07/31/2024 for a virtual visit (video or telephone).  Appointment scheduled for 08/09/2023 @ 10:20 Med req and consent are complete. Call patient at [phone].   CONSENT FOR VIRTUAL VISIT FOR:  [name] [name]  By participating in this virtual visit I agree to the following:  I hereby voluntarily request, consent and authorize [facility] and its employed or contracted physicians, physician assistants, nurse practitioners or other licensed health care professionals (the Practitioner), to provide me with telemedicine health care services (the "Services) as deemed necessary by the treating Practitioner. I acknowledge and consent to receive the Services by the Practitioner via telemedicine. I understand that the telemedicine visit will involve communicating with the Practitioner through live audiovisual communication technology and the disclosure of certain medical information by electronic transmission. I acknowledge that I have been given the opportunity to request an in-person assessment or other available alternative prior to the telemedicine visit and am voluntarily participating in the telemedicine visit.  I understand that I have the right to withhold or withdraw my consent to the use of telemedicine in the course of my care at any time, without affecting my right to future care or treatment, and that the Practitioner or I may terminate the telemedicine visit at any time. I understand that I have the right to inspect all information obtained and/or recorded in the course of the telemedicine visit and may receive copies of available information for a reasonable fee.  I understand that some of the potential risks of receiving the Services via telemedicine include:  Delay or interruption in medical evaluation due to technological equipment failure or disruption; Information transmitted may not be  sufficient (e.g. poor resolution of images) to allow for appropriate medical decision making by the Practitioner; and/or  In rare instances, security protocols could fail, causing a breach of personal health information.  Furthermore, I acknowledge that it is my responsibility to provide information about my medical history, conditions and care that is complete and accurate to the best of my ability. I acknowledge that Practitioner's advice, recommendations, and/or decision may be based on factors not within their control, such as incomplete or inaccurate data provided by me or distortions of diagnostic images or specimens that may result from electronic transmissions. I understand that the practice of medicine is not an exact science and that Practitioner makes no warranties or guarantees regarding treatment outcomes. I acknowledge that a copy of this consent can be made available to me via my patient portal [facility] MyChart), or I can request a printed copy by calling the office of [facility].    I understand that my insurance will be billed for this visit.   I have read or had this consent read to me. I understand the contents of this consent, which adequately explains the benefits and risks of the Services being provided via telemedicine.  I have been provided ample opportunity to ask questions regarding this consent and the Services and have had my questions answered to my satisfaction. I give my informed consent for the services to be provided through the use of telemedicine in my medical care

## 2024-08-01 ENCOUNTER — Other Ambulatory Visit: Payer: Self-pay | Admitting: Family Medicine

## 2024-08-01 DIAGNOSIS — E039 Hypothyroidism, unspecified: Secondary | ICD-10-CM

## 2024-08-04 ENCOUNTER — Ambulatory Visit: Attending: Physician Assistant

## 2024-08-04 DIAGNOSIS — I351 Nonrheumatic aortic (valve) insufficiency: Secondary | ICD-10-CM | POA: Diagnosis not present

## 2024-08-04 DIAGNOSIS — I480 Paroxysmal atrial fibrillation: Secondary | ICD-10-CM

## 2024-08-07 LAB — ECHOCARDIOGRAM COMPLETE
AR max vel: 3.46 cm2
AV Area VTI: 3.55 cm2
AV Area mean vel: 3.45 cm2
AV Mean grad: 3 mmHg
AV Peak grad: 5.9 mmHg
Ao pk vel: 1.21 m/s
Area-P 1/2: 3.72 cm2
S' Lateral: 3.65 cm

## 2024-08-08 ENCOUNTER — Ambulatory Visit: Attending: Cardiology

## 2024-08-08 DIAGNOSIS — Z01810 Encounter for preprocedural cardiovascular examination: Secondary | ICD-10-CM

## 2024-08-08 NOTE — Progress Notes (Signed)
 Virtual Visit via Telephone Note   Because of [name] [name] co-morbid illnesses, he is at least at moderate risk for complications without adequate follow up.  This format is felt to be most appropriate for this patient at this time.  Due to technical limitations with video connection (technology), today's appointment will be conducted as an audio only telehealth visit, and Jesua Tamblyn verbally agreed to proceed in this manner.   All issues noted in this document were discussed and addressed.  No physical exam could be performed with this format.  Evaluation Performed:  Preoperative cardiovascular risk assessment _____________   Date:  08/08/2024   Patient ID:  [name] [name], DOB [birth year], MRN [id number] Patient Location:  Home Provider location:   Office  Primary Care Provider:  Sharma Coyer, MD Primary Cardiologist:  Deatrice Cage, MD  Chief Complaint / Patient Profile   73 y.o. y/o male with a h/o angina, SVT, paroxysmal atrial fibrillation who is pending VITRECTOMY AND CATARACT REMOVAL VITREOUS OPACITIES AND CATARACTS  and presents today for telephonic preoperative cardiovascular risk assessment.  History of Present Illness    [name] [name] is a 73 y.o. male who presents via audio/video conferencing for a telehealth visit today.  Pt was last seen in cardiology clinic on 03/13/2024 by Bernardino Bring, PA-C.  At that time Sherrill Mckamie was doing well .  The patient is now pending procedure as outlined above. Since his last visit, he continues to be stable from a cardiac standpoint.  Today he denies chest pain, shortness of breath, lower extremity edema, fatigue, palpitations, melena, hematuria, hemoptysis, diaphoresis, weakness, presyncope, syncope, orthopnea, and PND.   Past Medical History    Past Medical History:  Diagnosis Date   Acquired hypothyroidism    Aortic atherosclerosis (HCC)    Basal cell carcinoma 08/13/2022   Left dorsal forearm. Superficial and  nodular. Tx with [facility] 09/17/22   Basal cell carcinoma 03/24/2023   Left medial calf. Superficial BCC with focal infiltration. [facility] 04/29/23   Coronary artery disease    a.) LHC 06/26/2019: EF 55-60%; LVEDP 25-30%; 80 pLAD, 40% mLAD, 40% p-mLCx, 30% mRCA; staged PCI planned. b.) LHC/staged PCI 07/05/2019: 55% mLAD, 40% p-mLCx, 30% mRCA, 85% p-mLAD, 50% D2 --> orbital atherectomy and PCI performed placing overlapping 3.0 x 8 mm (pLAD) and 3.0 x 30 mm (mLAD) Resolute Onyx DES x 2   Diverticulosis    ED (erectile dysfunction)    a.) on PDE5i (tadalifil)   Elevated LFTs    GERD (gastroesophageal reflux disease)    HLD (hyperlipidemia)    Hypertension    Long term current use of antithrombotics/antiplatelets    a.) daily DAPT therapy (ASA + clopidogrel )   Melanoma (HCC)    Left shoulder. 1984   PAF (paroxysmal atrial fibrillation) (HCC) 2004   a.) CHA2DS2-VASc = 3 (age, HTN, aortic plaque). b.) rate/rhythm maintained on oral diltiazem ; on daily DAPT (ASA + clopidogrel )   Paroxysmal SVT (supraventricular tachycardia) (HCC)    Pure hypercholesterolemia    Skin cancer    Left forearm. NMSC. Removed 2008 or prior   Past Surgical History:  Procedure Laterality Date   COLONOSCOPY WITH PROPOFOL  N/A 10/29/2023   Procedure: COLONOSCOPY WITH PROPOFOL ;  Surgeon: Maryruth Ole DASEN, MD;  Location: [facility] ENDOSCOPY;  Service: Endoscopy;  Laterality: N/A;   CORONARY ATHERECTOMY N/A 07/05/2019   Procedure: CORONARY ATHERECTOMY;  Surgeon: Cage Deatrice LABOR, MD;  [facility]: [facility];  Service: Cardiovascular;  Laterality: N/A;   CORONARY BALLOON ANGIOPLASTY N/A  07/05/2019   Procedure: CORONARY BALLOON ANGIOPLASTY;  Surgeon: Darron Deatrice LABOR, MD;  Location: [facility];  Service: Cardiovascular;  Laterality: N/A;   CORONARY STENT INTERVENTION N/A 07/05/2019   Procedure: CORONARY STENT INTERVENTION;  Surgeon: Darron Deatrice LABOR, MD;  Location: [facility];  Service: Cardiovascular;   Laterality: N/A;   EYE SURGERY Right 2014   Retina detachment   HEMOSTASIS CLIP PLACEMENT  10/29/2023   Procedure: HEMOSTASIS CLIP PLACEMENT;  Surgeon: Maryruth Ole DASEN, MD;  Location: [facility] ENDOSCOPY;  Service: Endoscopy;;   LEFT HEART CATH AND CORONARY ANGIOGRAPHY Left 06/26/2019   Procedure: LEFT HEART CATH AND CORONARY ANGIOGRAPHY;  Surgeon: Mady Bruckner, MD;  Location: [facility] INVASIVE CV LAB;  Service: Cardiovascular;  Laterality: Left;   LEFT HEART CATH AND CORONARY ANGIOGRAPHY N/A 05/05/2023   Procedure: LEFT HEART CATH AND CORONARY ANGIOGRAPHY;  Surgeon: Darron Deatrice LABOR, MD;  [facility]: [facility];  Service: Cardiovascular;  Laterality: N/A;   MELANOMA EXCISION  02/17/1983   NECK SURGERY  2004   POLYPECTOMY  10/29/2023   Procedure: POLYPECTOMY;  Surgeon: Maryruth Ole DASEN, MD;  Location: [facility] ENDOSCOPY;  Service: Endoscopy;;   SHOULDER ARTHROSCOPY WITH SUBACROMIAL DECOMPRESSION, ROTATOR CUFF REPAIR AND BICEP TENDON REPAIR Right 07/07/2022   Procedure: SUBACROMIAL DECOMPRESSION, ROTATOR CUFF REPAIR AND DISTAL CLAVICLE INCISION AND DEBRIDEMENT     distal clavicle incision, debridement;  Surgeon: Leora Lynwood SAUNDERS, MD;  Location: [facility] ORS;  Service: Orthopedics;  Laterality: Right;   XI ROBOTIC ASSISTED INGUINAL HERNIA REPAIR WITH MESH Bilateral 04/18/2020   Procedure: XI ROBOTIC ASSISTED INGUINAL HERNIA REPAIR WITH MESH;  Surgeon: Jordis Laneta FALCON, MD;  Location: [facility] ORS;  Service: General;  Laterality: Bilateral;    Allergies  Allergies  Allergen Reactions   Hydrocodone-Acetaminophen  Nausea And Vomiting and Other (See Comments)   Vicodin [Hydrocodone-Acetaminophen ] Nausea And Vomiting    Home Medications    Prior to Admission medications   Medication Sig Start Date End Date Taking? Authorizing Provider  acetaminophen  (TYLENOL ) 500 MG tablet Take 500-1,000 mg by mouth every 6 (six) hours as needed (pain.).    [provider]  aspirin  EC 81 MG tablet Take 81 mg  by mouth in the morning.    [provider]  cetirizine (ZYRTEC) 10 MG tablet Take 10 mg by mouth at bedtime.     [provider]  clopidogrel  (PLAVIX ) 75 MG tablet Take 1 tablet by mouth once daily 06/30/24   Dunn, Bernardino HERO, PA-C  diltiazem  (CARDIZEM  CD) 120 MG 24 hr capsule TAKE 1 CAPSULE BY MOUTH IN THE MORNING AND 1 AT BEDTIME 03/31/24   Darron Deatrice LABOR, MD  ezetimibe  (ZETIA ) 10 MG tablet TAKE 1 TABLET BY MOUTH ONCE DAILY IN THE EVENING 03/31/24   Darron Deatrice LABOR, MD  famotidine (PEPCID) 20 MG tablet Take 20 mg by mouth daily before supper.    [provider]  fluticasone (FLONASE) 50 MCG/ACT nasal spray Place 1 spray into both nostrils in the morning.    [provider]  levothyroxine  (SYNTHROID ) 25 MCG tablet TAKE 1 TABLET BY MOUTH ONCE DAILY BEFORE BREAKFAST 08/01/24   Simmons-Robinson, Makiera, MD  Misc Natural Products (OSTEO BI-FLEX TRIPLE STRENGTH PO) Take 2 tablets by mouth in the morning.    [provider]  Multiple Vitamin (MULTIVITAMIN WITH MINERALS) TABS tablet Take 1 tablet by mouth in the morning.    [provider]  nitroGLYCERIN  (NITROSTAT ) 0.4 MG SL tablet Place 1 tablet (0.4 mg total) under  the tongue every 5 (five) minutes as needed for chest pain. 06/26/19   End, Lonni, MD  Nutritional Supplements (FRUIT & VEGETABLE DAILY) CAPS Take 6 capsules by mouth in the morning. Balance of Nature Fruits (3) & Veggies (3)    [provider]  rosuvastatin  (CRESTOR ) 40 MG tablet Take 1 tablet by mouth once daily 03/31/24   Arida, Muhammad A, MD  sildenafil  (VIAGRA ) 100 MG tablet Take 1 tablet (100 mg total) by mouth daily as needed for erectile dysfunction. Take two hours prior to intercourse on an empty stomach 02/05/23   McGowan, Clotilda A, PA-C  Turmeric 500 MG CAPS Take 500 mg by mouth in the morning.    [provider]  VITAMIN D , CHOLECALCIFEROL, PO Take 1 tablet by mouth in the morning.    [provider]    Physical Exam    Vital Signs:  Kien Mirsky does not have vital signs available for review today.  Given telephonic nature of communication, physical exam is limited. AAOx3. NAD. Normal affect.  Speech and respirations are unlabored.  Accessory Clinical Findings    None  Assessment & Plan    1.  Preoperative Cardiovascular Risk Assessment: VITRECTOMY AND CATARACT REMOVAL VITREOUS OPACITIES AND CATARACTS    Date of Surgery:  Clearance 08/14/24                                  Surgeon:  DR., SELINDA SLOCUMB Surgeon's Group or Practice Name:  [facility] Phone number:  [phone] Fax number:  [phone]      Primary Cardiologist: Deatrice Cage, MD  Chart reviewed as part of pre-operative protocol coverage. Given past medical history and time since last visit, based on ACC/AHA guidelines, Quantez Schnyder would be at acceptable risk for the planned procedure without further cardiovascular testing.   His RCRI is low risk, 0.9% risk of major cardiac event.  He is able to complete greater than 4 METS of physical activity.  Patient was advised that if he develops new symptoms prior to surgery to contact our office to arrange a follow-up appointment.  He verbalized understanding.  His Plavix  may be held for 5 days prior to his procedure.Ideally aspirin  should be continued without interruption, however if the bleeding risk is too great, aspirin  may be held for 5-7 days prior to surgery. Please resume aspirin  post operatively when it is felt to be safe from a bleeding standpoint.   I will route this recommendation to the requesting party via Epic fax function and remove from pre-op pool.       Time:   Today, I have spent 5 minutes with the patient with telehealth technology discussing medical history, symptoms, and management plan.  I spent 10 minutes reviewing patient's past cardiac history and cardiac medications.    Josefa CHRISTELLA Beauvais, NP  08/08/2024,  7:46 AM

## 2024-08-09 ENCOUNTER — Ambulatory Visit: Payer: Self-pay | Admitting: Physician Assistant

## 2024-08-10 ENCOUNTER — Telehealth: Admitting: Physician Assistant

## 2024-08-10 DIAGNOSIS — B9689 Other specified bacterial agents as the cause of diseases classified elsewhere: Secondary | ICD-10-CM

## 2024-08-10 DIAGNOSIS — J208 Acute bronchitis due to other specified organisms: Secondary | ICD-10-CM

## 2024-08-10 MED ORDER — AZITHROMYCIN 250 MG PO TABS: ORAL_TABLET | ORAL | 0 refills | Status: AC

## 2024-08-10 MED ORDER — PREDNISONE 20 MG PO TABS: 40.0000 mg | ORAL_TABLET | Freq: Every day | ORAL | 0 refills | Status: DC

## 2024-08-10 NOTE — Progress Notes (Signed)
 Virtual Visit Consent   [name] [name], you are scheduled for a virtual visit with a [facility] provider today. Just as with appointments in the office, your consent must be obtained to participate. Your consent will be active for this visit and any virtual visit you may have with one of our providers in the next 365 days. If you have a MyChart account, a copy of this consent can be sent to you electronically.  As this is a virtual visit, video technology does not allow for your provider to perform a traditional examination. This may limit your provider's ability to fully assess your condition. If your provider identifies any concerns that need to be evaluated in person or the need to arrange testing (such as labs, EKG, etc.), we will make arrangements to do so. Although advances in technology are sophisticated, we cannot ensure that it will always work on either your end or our end. If the connection with a video visit is poor, the visit may have to be switched to a telephone visit. With either a video or telephone visit, we are not always able to ensure that we have a secure connection.  By engaging in this virtual visit, you consent to the provision of healthcare and authorize for your insurance to be billed (if applicable) for the services provided during this visit. Depending on your insurance coverage, you may receive a charge related to this service.  I need to obtain your verbal consent now. Are you willing to proceed with your visit today? [name] [name] has provided verbal consent on 08/10/2024 for a virtual visit (video or telephone). [name] CHRISTELLA Dickinson, PA-C  Date: 08/10/2024 4:55 PM   Virtual Visit via Video Note   I, [name] [name], connected with  [name] [name]  ([id number], 1951-09-23) on 08/10/24 at  4:45 PM EDT by a video-enabled telemedicine application and verified that I am speaking with the correct person using two identifiers.  Location: Patient: Virtual Visit Location  Patient: Home Provider: Virtual Visit Location Provider: Home Office   I discussed the limitations of evaluation and management by telemedicine and the availability of in person appointments. The patient expressed understanding and agreed to proceed.    History of Present Illness: [name] [name] is a 73 y.o. who identifies as a male who was assigned male at birth, and is being seen today for URI symptoms.  HPI: URI  This is a new problem. Episode onset: Seen 07/11/24, Virtually for same symptoms and given Prednisone  40mg  with some relief. Symptoms started to worsen yesterday. The problem has been gradually worsening. There has been no fever. Associated symptoms include chest pain, congestion, coughing, headaches, rhinorrhea and a sore throat (scratchy). Pertinent negatives include no ear pain or plugged ear sensation. Treatments tried: Mucinex. The treatment provided no relief.  Tested negative on at home test for Covid and Flu today.  Problems:  Patient Active Problem List   Diagnosis Date Noted   Encounter for subsequent annual wellness visit (AWV) in Medicare patient 04/04/2023   Annual physical exam 04/04/2023   Hearing loss 04/04/2023   COVID-19 06/02/2022   Acute pain of right shoulder 11/12/2021   Hypothyroidism 11/12/2021   Gastroesophageal reflux disease 11/12/2021   Atherosclerotic heart disease of native coronary artery with other forms of angina pectoris (HCC) 11/12/2021   Erectile dysfunction due to arterial insufficiency 10/16/2020   Benign prostatic hyperplasia with urinary frequency 10/16/2020   Left inguinal hernia 03/25/2020   SVT (supraventricular tachycardia) (HCC) 07/06/2019   NSVT (  nonsustained ventricular tachycardia) (HCC) 07/06/2019   Status post coronary artery stent placement    Hyperlipidemia LDL goal <70    Effort angina (HCC) 07/05/2019   Accelerating angina (HCC) 06/20/2019   Acquired hypothyroidism 04/02/2016   Paroxysmal atrial fibrillation (HCC)  12/14/2014   Dyslipidemia 12/14/2014    Allergies:  Allergies  Allergen Reactions   Hydrocodone-Acetaminophen  Nausea And Vomiting and Other (See Comments)   Vicodin [Hydrocodone-Acetaminophen ] Nausea And Vomiting   Medications:  Current Outpatient Medications:    azithromycin  (ZITHROMAX ) 250 MG tablet, Take 2 tablets on day 1, then 1 tablet daily on days 2 through 5, Disp: 6 tablet, Rfl: 0   predniSONE  (DELTASONE ) 20 MG tablet, Take 2 tablets (40 mg total) by mouth daily with breakfast., Disp: 10 tablet, Rfl: 0   acetaminophen  (TYLENOL ) 500 MG tablet, Take 500-1,000 mg by mouth every 6 (six) hours as needed (pain.)., Disp: , Rfl:    aspirin  EC 81 MG tablet, Take 81 mg by mouth in the morning., Disp: , Rfl:    cetirizine (ZYRTEC) 10 MG tablet, Take 10 mg by mouth at bedtime. , Disp: , Rfl:    clopidogrel  (PLAVIX ) 75 MG tablet, Take 1 tablet by mouth once daily, Disp: 90 tablet, Rfl: 0   diltiazem  (CARDIZEM  CD) 120 MG 24 hr capsule, TAKE 1 CAPSULE BY MOUTH IN THE MORNING AND 1 AT BEDTIME, Disp: 180 capsule, Rfl: 3   ezetimibe  (ZETIA ) 10 MG tablet, TAKE 1 TABLET BY MOUTH ONCE DAILY IN THE EVENING, Disp: 90 tablet, Rfl: 3   famotidine (PEPCID) 20 MG tablet, Take 20 mg by mouth daily before supper., Disp: , Rfl:    fluticasone (FLONASE) 50 MCG/ACT nasal spray, Place 1 spray into both nostrils in the morning., Disp: , Rfl:    levothyroxine  (SYNTHROID ) 25 MCG tablet, TAKE 1 TABLET BY MOUTH ONCE DAILY BEFORE BREAKFAST, Disp: 90 tablet, Rfl: 0   Misc Natural Products (OSTEO BI-FLEX TRIPLE STRENGTH PO), Take 2 tablets by mouth in the morning., Disp: , Rfl:    Multiple Vitamin (MULTIVITAMIN WITH MINERALS) TABS tablet, Take 1 tablet by mouth in the morning., Disp: , Rfl:    nitroGLYCERIN  (NITROSTAT ) 0.4 MG SL tablet, Place 1 tablet (0.4 mg total) under the tongue every 5 (five) minutes as needed for chest pain., Disp: 25 tablet, Rfl: 3   Nutritional Supplements (FRUIT & VEGETABLE DAILY) CAPS, Take 6  capsules by mouth in the morning. Balance of Nature Fruits (3) & Veggies (3), Disp: , Rfl:    rosuvastatin  (CRESTOR ) 40 MG tablet, Take 1 tablet by mouth once daily, Disp: 90 tablet, Rfl: 3   sildenafil  (VIAGRA ) 100 MG tablet, Take 1 tablet (100 mg total) by mouth daily as needed for erectile dysfunction. Take two hours prior to intercourse on an empty stomach, Disp: 30 tablet, Rfl: 3   Turmeric 500 MG CAPS, Take 500 mg by mouth in the morning., Disp: , Rfl:    VITAMIN D , CHOLECALCIFEROL, PO, Take 1 tablet by mouth in the morning., Disp: , Rfl:   Observations/Objective: Patient is well-developed, well-nourished in no acute distress.  Resting comfortably at home.  Head is normocephalic, atraumatic.  No labored breathing.  Speech is clear and coherent with logical content.  Patient is alert and oriented at baseline.    Assessment and Plan: 1. Acute bacterial bronchitis (Primary) - azithromycin  (ZITHROMAX ) 250 MG tablet; Take 2 tablets on day 1, then 1 tablet daily on days 2 through 5  Dispense: 6 tablet; Refill: 0 - predniSONE  (  DELTASONE ) 20 MG tablet; Take 2 tablets (40 mg total) by mouth daily with breakfast.  Dispense: 10 tablet; Refill: 0  - Worsening over a week despite OTC medications - Will treat with Z-pack and Prednisone  - Can continue Mucinex  - Push fluids.  - Rest.  - Steam and humidifier can help - Seek in person evaluation if worsening or symptoms fail to improve   Follow Up Instructions: I discussed the assessment and treatment plan with the patient. The patient was provided an opportunity to ask questions and all were answered. The patient agreed with the plan and demonstrated an understanding of the instructions.  A copy of instructions were sent to the patient via MyChart unless otherwise noted below.    The patient was advised to call back or seek an in-person evaluation if the symptoms worsen or if the condition fails to improve as anticipated.    [name] CHRISTELLA Dickinson, PA-C

## 2024-08-10 NOTE — Patient Instructions (Signed)
 [name], thank you for joining [name] CHRISTELLA Dickinson, PA-C for today's virtual visit.  While this provider is not your primary care provider (PCP), if your PCP is located in our provider database this encounter information will be shared with them immediately following your visit.   A [facility] MyChart account gives you access to today's visit and all your visits, tests, and labs performed at [facility]  click here if you don't have a [facility] MyChart account or go to [email or web address]  Consent: (Patient) Trevis Eden provided verbal consent for this virtual visit at the beginning of the encounter.  Current Medications:  Current Outpatient Medications:    azithromycin  (ZITHROMAX ) 250 MG tablet, Take 2 tablets on day 1, then 1 tablet daily on days 2 through 5, Disp: 6 tablet, Rfl: 0   predniSONE  (DELTASONE ) 20 MG tablet, Take 2 tablets (40 mg total) by mouth daily with breakfast., Disp: 10 tablet, Rfl: 0   acetaminophen  (TYLENOL ) 500 MG tablet, Take 500-1,000 mg by mouth every 6 (six) hours as needed (pain.)., Disp: , Rfl:    aspirin  EC 81 MG tablet, Take 81 mg by mouth in the morning., Disp: , Rfl:    cetirizine (ZYRTEC) 10 MG tablet, Take 10 mg by mouth at bedtime. , Disp: , Rfl:    clopidogrel  (PLAVIX ) 75 MG tablet, Take 1 tablet by mouth once daily, Disp: 90 tablet, Rfl: 0   diltiazem  (CARDIZEM  CD) 120 MG 24 hr capsule, TAKE 1 CAPSULE BY MOUTH IN THE MORNING AND 1 AT BEDTIME, Disp: 180 capsule, Rfl: 3   ezetimibe  (ZETIA ) 10 MG tablet, TAKE 1 TABLET BY MOUTH ONCE DAILY IN THE EVENING, Disp: 90 tablet, Rfl: 3   famotidine (PEPCID) 20 MG tablet, Take 20 mg by mouth daily before supper., Disp: , Rfl:    fluticasone (FLONASE) 50 MCG/ACT nasal spray, Place 1 spray into both nostrils in the morning., Disp: , Rfl:    levothyroxine  (SYNTHROID ) 25 MCG tablet, TAKE 1 TABLET BY MOUTH ONCE DAILY BEFORE BREAKFAST, Disp: 90 tablet, Rfl: 0   Misc Natural Products (OSTEO  BI-FLEX TRIPLE STRENGTH PO), Take 2 tablets by mouth in the morning., Disp: , Rfl:    Multiple Vitamin (MULTIVITAMIN WITH MINERALS) TABS tablet, Take 1 tablet by mouth in the morning., Disp: , Rfl:    nitroGLYCERIN  (NITROSTAT ) 0.4 MG SL tablet, Place 1 tablet (0.4 mg total) under the tongue every 5 (five) minutes as needed for chest pain., Disp: 25 tablet, Rfl: 3   Nutritional Supplements (FRUIT & VEGETABLE DAILY) CAPS, Take 6 capsules by mouth in the morning. Balance of Nature Fruits (3) & Veggies (3), Disp: , Rfl:    rosuvastatin  (CRESTOR ) 40 MG tablet, Take 1 tablet by mouth once daily, Disp: 90 tablet, Rfl: 3   sildenafil  (VIAGRA ) 100 MG tablet, Take 1 tablet (100 mg total) by mouth daily as needed for erectile dysfunction. Take two hours prior to intercourse on an empty stomach, Disp: 30 tablet, Rfl: 3   Turmeric 500 MG CAPS, Take 500 mg by mouth in the morning., Disp: , Rfl:    VITAMIN D , CHOLECALCIFEROL, PO, Take 1 tablet by mouth in the morning., Disp: , Rfl:    Medications ordered in this encounter:  Meds ordered this encounter  Medications   azithromycin  (ZITHROMAX ) 250 MG tablet    Sig: Take 2 tablets on day 1, then 1 tablet daily on days 2 through 5    Dispense:  6 tablet    Refill:  0  Supervising Provider:   LAMPTEY, PHILIP O [[id number]]   predniSONE  (DELTASONE ) 20 MG tablet    Sig: Take 2 tablets (40 mg total) by mouth daily with breakfast.    Dispense:  10 tablet    Refill:  0    Supervising Provider:   BLAISE ALEENE KIDD [phone]     *If you need refills on other medications prior to your next appointment, please contact your pharmacy*  Follow-Up: Call back or seek an in-person evaluation if the symptoms worsen or if the condition fails to improve as anticipated.  [facility] [phone]  Other Instructions Upper Respiratory Infection, Adult An upper respiratory infection (URI) is a common viral infection of the nose, throat, and upper air passages  that lead to the lungs. The most common type of URI is the common cold. URIs usually get better on their own, without medical treatment. What are the causes? A URI is caused by a virus. You may catch a virus by: Breathing in droplets from an infected person's cough or sneeze. Touching something that has been exposed to the virus (is contaminated) and then touching your mouth, nose, or eyes. What increases the risk? You are more likely to get a URI if: You are very young or very old. You have close contact with others, such as at work, school, or a health care facility. You smoke. You have long-term (chronic) heart or lung disease. You have a weakened disease-fighting system (immune system). You have nasal allergies or asthma. You are experiencing a lot of stress. You have poor nutrition. What are the signs or symptoms? A URI usually involves some of the following symptoms: Runny or stuffy (congested) nose. Cough. Sneezing. Sore throat. Headache. Fatigue. Fever. Loss of appetite. Pain in your forehead, behind your eyes, and over your cheekbones (sinus pain). Muscle aches. Redness or irritation of the eyes. Pressure in the ears or face. How is this diagnosed? This condition may be diagnosed based on your medical history and symptoms, and a physical exam. Your health care provider may use a swab to take a mucus sample from your nose (nasal swab). This sample can be tested to determine what virus is causing the illness. How is this treated? URIs usually get better on their own within 7-10 days. Medicines cannot cure URIs, but your health care provider may recommend certain medicines to help relieve symptoms, such as: Over-the-counter cold medicines. Cough suppressants. Coughing is a type of defense against infection that helps to clear the respiratory system, so take these medicines only as recommended by your health care provider. Fever-reducing medicines. Follow these instructions  at home: Activity Rest as needed. If you have a fever, stay home from work or school until your fever is gone or until your health care provider says your URI cannot spread to other people (is no longer contagious). Your health care provider may have you wear a face mask to prevent your infection from spreading. Relieving symptoms Gargle with a mixture of salt and water 3-4 times a day or as needed. To make salt water, completely dissolve -1 tsp (3-6 g) of salt in 1 cup (237 mL) of warm water. Use a cool-mist humidifier to add moisture to the air. This can help you breathe more easily. Eating and drinking  Drink enough fluid to keep your urine pale yellow. Eat soups and other clear broths. General instructions  Take over-the-counter and prescription medicines only as told by your health care provider. These include cold medicines,  fever reducers, and cough suppressants. Do not use any products that contain nicotine or tobacco. These products include cigarettes, chewing tobacco, and vaping devices, such as e-cigarettes. If you need help quitting, ask your health care provider. Stay away from secondhand smoke. Stay up to date on all immunizations, including the yearly (annual) flu vaccine. Keep all follow-up visits. This is important. How to prevent the spread of infection to others URIs can be contagious. To prevent the infection from spreading: Wash your hands with soap and water for at least 20 seconds. If soap and water are not available, use hand sanitizer. Avoid touching your mouth, face, eyes, or nose. Cough or sneeze into a tissue or your sleeve or elbow instead of into your hand or into the air.  Contact a health care provider if: You are getting worse instead of better. You have a fever or chills. Your mucus is brown or red. You have yellow or brown discharge coming from your nose. You have pain in your face, especially when you bend forward. You have swollen neck glands. You  have pain while swallowing. You have white areas in the back of your throat. Get help right away if: You have shortness of breath that gets worse. You have severe or persistent: Headache. Ear pain. Sinus pain. Chest pain. You have chronic lung disease along with any of the following: Making high-pitched whistling sounds when you breathe, most often when you breathe out (wheezing). Prolonged cough (more than 14 days). Coughing up blood. A change in your usual mucus. You have a stiff neck. You have changes in your: Vision. Hearing. Thinking. Mood. These symptoms may be an emergency. Get help right away. Call 911. Do not wait to see if the symptoms will go away. Do not drive yourself to the hospital. Summary An upper respiratory infection (URI) is a common infection of the nose, throat, and upper air passages that lead to the lungs. A URI is caused by a virus. URIs usually get better on their own within 7-10 days. Medicines cannot cure URIs, but your health care provider may recommend certain medicines to help relieve symptoms. This information is not intended to replace advice given to you by your health care provider. Make sure you discuss any questions you have with your health care provider. Document Revised: 06/25/2021 Document Reviewed: 06/25/2021 Elsevier Patient Education  2024 [employer]   If you have been instructed to have an in-person evaluation today at a local Urgent Care facility, please use the link below. It will take you to a list of all of our available [facility], including address, phone number and hours of operation. Please do not delay care.  [facility]  If you or a family member do not have a primary care provider, use the link below to schedule a visit and establish care. When you choose a [facility] primary care physician or advanced practice provider, you gain a long-term partner in health. Find a Primary Care  Provider  Learn more about [facility] in-office and virtual care options: [facility] Get Care Now

## 2024-08-13 ENCOUNTER — Telehealth: Admitting: Family

## 2024-08-13 DIAGNOSIS — U071 COVID-19: Secondary | ICD-10-CM

## 2024-08-13 MED ORDER — PROMETHAZINE-DM 6.25-15 MG/5ML PO SYRP: 5.0000 mL | ORAL_SOLUTION | Freq: Three times a day (TID) | ORAL | 0 refills | Status: AC | PRN

## 2024-08-13 MED ORDER — MOLNUPIRAVIR EUA 200MG CAPSULE: 4.0000 | ORAL_CAPSULE | Freq: Two times a day (BID) | ORAL | 0 refills | Status: AC

## 2024-08-13 NOTE — Progress Notes (Signed)
 Virtual Visit Consent   [name], you are scheduled for a virtual visit with a [facility] provider today. Just as with appointments in the office, your consent must be obtained to participate. Your consent will be active for this visit and any virtual visit you may have with one of our providers in the next 365 days. If you have a MyChart account, a copy of this consent can be sent to you electronically.  As this is a virtual visit, video technology does not allow for your provider to perform a traditional examination. This may limit your provider's ability to fully assess your condition. If your provider identifies any concerns that need to be evaluated in person or the need to arrange testing (such as labs, EKG, etc.), we will make arrangements to do so. Although advances in technology are sophisticated, we cannot ensure that it will always work on either your end or our end. If the connection with a video visit is poor, the visit may have to be switched to a telephone visit. With either a video or telephone visit, we are not always able to ensure that we have a secure connection.  By engaging in this virtual visit, you consent to the provision of healthcare and authorize for your insurance to be billed (if applicable) for the services provided during this visit. Depending on your insurance coverage, you may receive a charge related to this service.  I need to obtain your verbal consent now. Are you willing to proceed with your visit today? [name] [name] has provided verbal consent on 08/13/2024 for a virtual visit (video or telephone). Bari Learn, FNP  Date: 08/13/2024 12:40 PM   Virtual Visit via Video Note   I, Bari Learn, connected with  [name]  ([id number], 1951/11/08) on 08/13/24 at 12:45 PM EDT by a video-enabled telemedicine application and verified that I am speaking with the correct person using two identifiers.  Location: Patient: Virtual Visit Location Patient:  Home Provider: Virtual Visit Location Provider: Home Office   I discussed the limitations of evaluation and management by telemedicine and the availability of in person appointments. The patient expressed understanding and agreed to proceed.    History of Present Illness: [name] [name] is a 73 y.o. who identifies as a male who was assigned male at birth, and is being seen today for COVID that started three days. He tested positive this morning.   HPI: URI  This is a new problem. The current episode started in the past 7 days. The problem has been gradually worsening. The maximum temperature recorded prior to his arrival was 100.4 - 100.9 F. Associated symptoms include congestion, coughing, ear pain, headaches, rhinorrhea, sinus pain and sneezing. Pertinent negatives include no joint pain, nausea, sore throat, vomiting or wheezing. Associated symptoms comments: Diarrhea . He has tried acetaminophen  for the symptoms. The treatment provided mild relief.    Problems:  Patient Active Problem List   Diagnosis Date Noted   Encounter for subsequent annual wellness visit (AWV) in Medicare patient 04/04/2023   Annual physical exam 04/04/2023   Hearing loss 04/04/2023   COVID-19 06/02/2022   Acute pain of right shoulder 11/12/2021   Hypothyroidism 11/12/2021   Gastroesophageal reflux disease 11/12/2021   Atherosclerotic heart disease of native coronary artery with other forms of angina pectoris (HCC) 11/12/2021   Erectile dysfunction due to arterial insufficiency 10/16/2020   Benign prostatic hyperplasia with urinary frequency 10/16/2020   Left inguinal hernia 03/25/2020   SVT (supraventricular tachycardia) (HCC) 07/06/2019  NSVT (nonsustained ventricular tachycardia) (HCC) 07/06/2019   Status post coronary artery stent placement    Hyperlipidemia LDL goal <70    Effort angina (HCC) 07/05/2019   Accelerating angina (HCC) 06/20/2019   Acquired hypothyroidism 04/02/2016   Paroxysmal atrial  fibrillation (HCC) 12/14/2014   Dyslipidemia 12/14/2014    Allergies:  Allergies  Allergen Reactions   Hydrocodone-Acetaminophen  Nausea And Vomiting and Other (See Comments)   Vicodin [Hydrocodone-Acetaminophen ] Nausea And Vomiting   Medications:  Current Outpatient Medications:    molnupiravir  EUA (LAGEVRIO ) 200 mg CAPS capsule, Take 4 capsules (800 mg total) by mouth 2 (two) times daily for 5 days., Disp: 40 capsule, Rfl: 0   promethazine -dextromethorphan (PROMETHAZINE -DM) 6.25-15 MG/5ML syrup, Take 5 mLs by mouth 3 (three) times daily as needed for cough., Disp: 118 mL, Rfl: 0   acetaminophen  (TYLENOL ) 500 MG tablet, Take 500-1,000 mg by mouth every 6 (six) hours as needed (pain.)., Disp: , Rfl:    aspirin  EC 81 MG tablet, Take 81 mg by mouth in the morning., Disp: , Rfl:    azithromycin  (ZITHROMAX ) 250 MG tablet, Take 2 tablets on day 1, then 1 tablet daily on days 2 through 5, Disp: 6 tablet, Rfl: 0   cetirizine (ZYRTEC) 10 MG tablet, Take 10 mg by mouth at bedtime. , Disp: , Rfl:    clopidogrel  (PLAVIX ) 75 MG tablet, Take 1 tablet by mouth once daily, Disp: 90 tablet, Rfl: 0   diltiazem  (CARDIZEM  CD) 120 MG 24 hr capsule, TAKE 1 CAPSULE BY MOUTH IN THE MORNING AND 1 AT BEDTIME, Disp: 180 capsule, Rfl: 3   ezetimibe  (ZETIA ) 10 MG tablet, TAKE 1 TABLET BY MOUTH ONCE DAILY IN THE EVENING, Disp: 90 tablet, Rfl: 3   famotidine (PEPCID) 20 MG tablet, Take 20 mg by mouth daily before supper., Disp: , Rfl:    fluticasone (FLONASE) 50 MCG/ACT nasal spray, Place 1 spray into both nostrils in the morning., Disp: , Rfl:    levothyroxine  (SYNTHROID ) 25 MCG tablet, TAKE 1 TABLET BY MOUTH ONCE DAILY BEFORE BREAKFAST, Disp: 90 tablet, Rfl: 0   Misc Natural Products (OSTEO BI-FLEX TRIPLE STRENGTH PO), Take 2 tablets by mouth in the morning., Disp: , Rfl:    Multiple Vitamin (MULTIVITAMIN WITH MINERALS) TABS tablet, Take 1 tablet by mouth in the morning., Disp: , Rfl:    nitroGLYCERIN  (NITROSTAT ) 0.4 MG  SL tablet, Place 1 tablet (0.4 mg total) under the tongue every 5 (five) minutes as needed for chest pain., Disp: 25 tablet, Rfl: 3   Nutritional Supplements (FRUIT & VEGETABLE DAILY) CAPS, Take 6 capsules by mouth in the morning. Balance of Nature Fruits (3) & Veggies (3), Disp: , Rfl:    predniSONE  (DELTASONE ) 20 MG tablet, Take 2 tablets (40 mg total) by mouth daily with breakfast., Disp: 10 tablet, Rfl: 0   rosuvastatin  (CRESTOR ) 40 MG tablet, Take 1 tablet by mouth once daily, Disp: 90 tablet, Rfl: 3   sildenafil  (VIAGRA ) 100 MG tablet, Take 1 tablet (100 mg total) by mouth daily as needed for erectile dysfunction. Take two hours prior to intercourse on an empty stomach, Disp: 30 tablet, Rfl: 3   Turmeric 500 MG CAPS, Take 500 mg by mouth in the morning., Disp: , Rfl:    VITAMIN D , CHOLECALCIFEROL, PO, Take 1 tablet by mouth in the morning., Disp: , Rfl:   Observations/Objective: Patient is well-developed, well-nourished in no acute distress.  Resting comfortably  at home.  Head is normocephalic, atraumatic.  No labored breathing.  Speech is clear and coherent with logical content.  Patient is alert and oriented at baseline.  Dry nonproductive cough  Assessment and Plan: 1. COVID-19 (Primary) - molnupiravir  EUA (LAGEVRIO ) 200 mg CAPS capsule; Take 4 capsules (800 mg total) by mouth 2 (two) times daily for 5 days.  Dispense: 40 capsule; Refill: 0 - promethazine -dextromethorphan (PROMETHAZINE -DM) 6.25-15 MG/5ML syrup; Take 5 mLs by mouth 3 (three) times daily as needed for cough.  Dispense: 118 mL; Refill: 0  COVID positive, rest, force fluids, tylenol  as needed, report any worsening symptoms such as increased shortness of breath, swelling, or continued high fevers. Possible adverse effects discussed with antivirals.    Follow Up Instructions: I discussed the assessment and treatment plan with the patient. The patient was provided an opportunity to ask questions and all were answered.  The patient agreed with the plan and demonstrated an understanding of the instructions.  A copy of instructions were sent to the patient via MyChart unless otherwise noted below.    The patient was advised to call back or seek an in-person evaluation if the symptoms worsen or if the condition fails to improve as anticipated.    Bari Learn, FNP

## 2024-08-17 ENCOUNTER — Telehealth: Admitting: Nurse Practitioner

## 2024-08-17 DIAGNOSIS — U071 COVID-19: Secondary | ICD-10-CM

## 2024-08-17 NOTE — Progress Notes (Signed)
 Virtual Visit Consent   [name] [name], you are scheduled for a virtual visit with a [facility] provider today. Just as with appointments in the office, your consent must be obtained to participate. Your consent will be active for this visit and any virtual visit you may have with one of our providers in the next 365 days. If you have a MyChart account, a copy of this consent can be sent to you electronically.  As this is a virtual visit, video technology does not allow for your provider to perform a traditional examination. This may limit your provider's ability to fully assess your condition. If your provider identifies any concerns that need to be evaluated in person or the need to arrange testing (such as labs, EKG, etc.), we will make arrangements to do so. Although advances in technology are sophisticated, we cannot ensure that it will always work on either your end or our end. If the connection with a video visit is poor, the visit may have to be switched to a telephone visit. With either a video or telephone visit, we are not always able to ensure that we have a secure connection.  By engaging in this virtual visit, you consent to the provision of healthcare and authorize for your insurance to be billed (if applicable) for the services provided during this visit. Depending on your insurance coverage, you may receive a charge related to this service.  I need to obtain your verbal consent now. Are you willing to proceed with your visit today? [name] [name] has provided verbal consent on 08/17/2024 for a virtual visit (video or telephone). [name] LELON Servant, NP  Date: 08/17/2024 1:26 PM   Virtual Visit via Video Note   I, [name] [name], connected with  [name] [name]  ([id number], [birth year]) on 08/17/24 at  1:30 PM EDT by a video-enabled telemedicine application and verified that I am speaking with the correct person using two identifiers.  Location: Patient: Virtual Visit Location Patient:  Mobile Provider: Virtual Visit Location Provider: Home Office   I discussed the limitations of evaluation and management by telemedicine and the availability of in person appointments. The patient expressed understanding and agreed to proceed.    History of Present Illness: [name] [name] is a 73 y.o. who identifies as a male who was assigned male at birth, and is being seen today for post COVID symptoms.  Tested positive for COVID 4 days ago. He was unable to afford molnupiravir  and paxlovid was contraindicated with his medications. Prior to 4 days ago he had been prescribed zpak and prednisone  for presumed bacterial bronchitis which he completed. Today he states the cough has somewhat improved however he continues to persistently run a fever between 100-102. He has taken tylenol  500 mg once yesterday but not consistently to bring fever down. Associated symptoms include fatigue and malaise. He denies shortness of breath or chest pain.    Problems:  Patient Active Problem List   Diagnosis Date Noted   Encounter for subsequent annual wellness visit (AWV) in Medicare patient 04/04/2023   Annual physical exam 04/04/2023   Hearing loss 04/04/2023   COVID-19 06/02/2022   Acute pain of right shoulder 11/12/2021   Hypothyroidism 11/12/2021   Gastroesophageal reflux disease 11/12/2021   Atherosclerotic heart disease of native coronary artery with other forms of angina pectoris (HCC) 11/12/2021   Erectile dysfunction due to arterial insufficiency 10/16/2020   Benign prostatic hyperplasia with urinary frequency 10/16/2020   Left inguinal hernia 03/25/2020   SVT (supraventricular  tachycardia) (HCC) 07/06/2019   NSVT (nonsustained ventricular tachycardia) (HCC) 07/06/2019   Status post coronary artery stent placement    Hyperlipidemia LDL goal <70    Effort angina (HCC) 07/05/2019   Accelerating angina (HCC) 06/20/2019   Acquired hypothyroidism 04/02/2016   Paroxysmal atrial fibrillation (HCC)  12/14/2014   Dyslipidemia 12/14/2014    Allergies:  Allergies  Allergen Reactions   Hydrocodone-Acetaminophen  Nausea And Vomiting and Other (See Comments)   Vicodin [Hydrocodone-Acetaminophen ] Nausea And Vomiting   Medications:  Current Outpatient Medications:    acetaminophen  (TYLENOL ) 500 MG tablet, Take 500-1,000 mg by mouth every 6 (six) hours as needed (pain.)., Disp: , Rfl:    aspirin  EC 81 MG tablet, Take 81 mg by mouth in the morning., Disp: , Rfl:    cetirizine (ZYRTEC) 10 MG tablet, Take 10 mg by mouth at bedtime. , Disp: , Rfl:    clopidogrel  (PLAVIX ) 75 MG tablet, Take 1 tablet by mouth once daily, Disp: 90 tablet, Rfl: 0   diltiazem  (CARDIZEM  CD) 120 MG 24 hr capsule, TAKE 1 CAPSULE BY MOUTH IN THE MORNING AND 1 AT BEDTIME, Disp: 180 capsule, Rfl: 3   ezetimibe  (ZETIA ) 10 MG tablet, TAKE 1 TABLET BY MOUTH ONCE DAILY IN THE EVENING, Disp: 90 tablet, Rfl: 3   famotidine (PEPCID) 20 MG tablet, Take 20 mg by mouth daily before supper., Disp: , Rfl:    fluticasone (FLONASE) 50 MCG/ACT nasal spray, Place 1 spray into both nostrils in the morning., Disp: , Rfl:    levothyroxine  (SYNTHROID ) 25 MCG tablet, TAKE 1 TABLET BY MOUTH ONCE DAILY BEFORE BREAKFAST, Disp: 90 tablet, Rfl: 0   Misc Natural Products (OSTEO BI-FLEX TRIPLE STRENGTH PO), Take 2 tablets by mouth in the morning., Disp: , Rfl:    molnupiravir  EUA (LAGEVRIO ) 200 mg CAPS capsule, Take 4 capsules (800 mg total) by mouth 2 (two) times daily for 5 days., Disp: 40 capsule, Rfl: 0   Multiple Vitamin (MULTIVITAMIN WITH MINERALS) TABS tablet, Take 1 tablet by mouth in the morning., Disp: , Rfl:    nitroGLYCERIN  (NITROSTAT ) 0.4 MG SL tablet, Place 1 tablet (0.4 mg total) under the tongue every 5 (five) minutes as needed for chest pain., Disp: 25 tablet, Rfl: 3   Nutritional Supplements (FRUIT & VEGETABLE DAILY) CAPS, Take 6 capsules by mouth in the morning. Balance of Nature Fruits (3) & Veggies (3), Disp: , Rfl:    predniSONE   (DELTASONE ) 20 MG tablet, Take 2 tablets (40 mg total) by mouth daily with breakfast., Disp: 10 tablet, Rfl: 0   promethazine -dextromethorphan (PROMETHAZINE -DM) 6.25-15 MG/5ML syrup, Take 5 mLs by mouth 3 (three) times daily as needed for cough., Disp: 118 mL, Rfl: 0   rosuvastatin  (CRESTOR ) 40 MG tablet, Take 1 tablet by mouth once daily, Disp: 90 tablet, Rfl: 3   sildenafil  (VIAGRA ) 100 MG tablet, Take 1 tablet (100 mg total) by mouth daily as needed for erectile dysfunction. Take two hours prior to intercourse on an empty stomach, Disp: 30 tablet, Rfl: 3   Turmeric 500 MG CAPS, Take 500 mg by mouth in the morning., Disp: , Rfl:    VITAMIN D , CHOLECALCIFEROL, PO, Take 1 tablet by mouth in the morning., Disp: , Rfl:   Observations/Objective: Patient is well-developed, well-nourished in no acute distress.  Resting comfortably  Head is normocephalic, atraumatic.  No labored breathing.  Speech is clear and coherent with logical content.  Patient is alert and oriented at baseline.    Followed by while  Assessment and  Plan: 1. COVID-19 (Primary) Take tylenol  every 6-8 hours for fever. If no improvement in 48 hours will need to be seen in person.   Follow Up Instructions: I discussed the assessment and treatment plan with the patient. The patient was provided an opportunity to ask questions and all were answered. The patient agreed with the plan and demonstrated an understanding of the instructions.  A copy of instructions were sent to the patient via MyChart unless otherwise noted below.     The patient was advised to call back or seek an in-person evaluation if the symptoms worsen or if the condition fails to improve as anticipated.    Deshea Pooley W Clinten Howk, NP

## 2024-08-17 NOTE — Patient Instructions (Signed)
 [name] [name], thank you for joining [name] LELON Servant, NP for today's virtual visit.  While this provider is not your primary care provider (PCP), if your PCP is located in our provider database this encounter information will be shared with them immediately following your visit.   A [facility] MyChart account gives you access to today's visit and all your visits, tests, and labs performed at [facility]  click here if you don't have a [facility] MyChart account or go to [email or web address]  Consent: (Patient) [name] [name] provided verbal consent for this virtual visit at the beginning of the encounter.  Current Medications:  Current Outpatient Medications:    acetaminophen  (TYLENOL ) 500 MG tablet, Take 500-1,000 mg by mouth every 6 (six) hours as needed (pain.)., Disp: , Rfl:    aspirin  EC 81 MG tablet, Take 81 mg by mouth in the morning., Disp: , Rfl:    cetirizine (ZYRTEC) 10 MG tablet, Take 10 mg by mouth at bedtime. , Disp: , Rfl:    clopidogrel  (PLAVIX ) 75 MG tablet, Take 1 tablet by mouth once daily, Disp: 90 tablet, Rfl: 0   diltiazem  (CARDIZEM  CD) 120 MG 24 hr capsule, TAKE 1 CAPSULE BY MOUTH IN THE MORNING AND 1 AT BEDTIME, Disp: 180 capsule, Rfl: 3   ezetimibe  (ZETIA ) 10 MG tablet, TAKE 1 TABLET BY MOUTH ONCE DAILY IN THE EVENING, Disp: 90 tablet, Rfl: 3   famotidine (PEPCID) 20 MG tablet, Take 20 mg by mouth daily before supper., Disp: , Rfl:    fluticasone (FLONASE) 50 MCG/ACT nasal spray, Place 1 spray into both nostrils in the morning., Disp: , Rfl:    levothyroxine  (SYNTHROID ) 25 MCG tablet, TAKE 1 TABLET BY MOUTH ONCE DAILY BEFORE BREAKFAST, Disp: 90 tablet, Rfl: 0   Misc Natural Products (OSTEO BI-FLEX TRIPLE STRENGTH PO), Take 2 tablets by mouth in the morning., Disp: , Rfl:    molnupiravir  EUA (LAGEVRIO ) 200 mg CAPS capsule, Take 4 capsules (800 mg total) by mouth 2 (two) times daily for 5 days., Disp: 40 capsule, Rfl: 0   Multiple Vitamin  (MULTIVITAMIN WITH MINERALS) TABS tablet, Take 1 tablet by mouth in the morning., Disp: , Rfl:    nitroGLYCERIN  (NITROSTAT ) 0.4 MG SL tablet, Place 1 tablet (0.4 mg total) under the tongue every 5 (five) minutes as needed for chest pain., Disp: 25 tablet, Rfl: 3   Nutritional Supplements (FRUIT & VEGETABLE DAILY) CAPS, Take 6 capsules by mouth in the morning. Balance of Nature Fruits (3) & Veggies (3), Disp: , Rfl:    predniSONE  (DELTASONE ) 20 MG tablet, Take 2 tablets (40 mg total) by mouth daily with breakfast., Disp: 10 tablet, Rfl: 0   promethazine -dextromethorphan (PROMETHAZINE -DM) 6.25-15 MG/5ML syrup, Take 5 mLs by mouth 3 (three) times daily as needed for cough., Disp: 118 mL, Rfl: 0   rosuvastatin  (CRESTOR ) 40 MG tablet, Take 1 tablet by mouth once daily, Disp: 90 tablet, Rfl: 3   sildenafil  (VIAGRA ) 100 MG tablet, Take 1 tablet (100 mg total) by mouth daily as needed for erectile dysfunction. Take two hours prior to intercourse on an empty stomach, Disp: 30 tablet, Rfl: 3   Turmeric 500 MG CAPS, Take 500 mg by mouth in the morning., Disp: , Rfl:    VITAMIN D , CHOLECALCIFEROL, PO, Take 1 tablet by mouth in the morning., Disp: , Rfl:    Medications ordered in this encounter:  No orders of the defined types were placed in this encounter.    *If you need refills  on other medications prior to your next appointment, please contact your pharmacy*  Follow-Up: Call back or seek an in-person evaluation if the symptoms worsen or if the condition fails to improve as anticipated.  [facility] [phone]  Other Instructions Take tylenol  every 6-8 hours for fever. If no improvement in 48 hours will need to be seen in person.    If you have been instructed to have an in-person evaluation today at a local Urgent Care facility, please use the link below. It will take you to a list of all of our available [facility], including address, phone number and hours of  operation. Please do not delay care.  [facility]  If you or a family member do not have a primary care provider, use the link below to schedule a visit and establish care. When you choose a [facility] primary care physician or advanced practice provider, you gain a long-term partner in health. Find a Primary Care Provider  Learn more about [facility]'s in-office and virtual care options: [facility] - Get Care Now

## 2024-09-11 DIAGNOSIS — H43392 Other vitreous opacities, left eye: Secondary | ICD-10-CM | POA: Diagnosis not present

## 2024-09-11 DIAGNOSIS — H2512 Age-related nuclear cataract, left eye: Secondary | ICD-10-CM | POA: Diagnosis not present

## 2024-09-12 ENCOUNTER — Ambulatory Visit: Admitting: Physician Assistant

## 2024-09-12 ENCOUNTER — Other Ambulatory Visit

## 2024-09-19 DIAGNOSIS — H43392 Other vitreous opacities, left eye: Secondary | ICD-10-CM | POA: Diagnosis not present

## 2024-09-19 DIAGNOSIS — Z9889 Other specified postprocedural states: Secondary | ICD-10-CM | POA: Diagnosis not present

## 2024-10-02 NOTE — Progress Notes (Unsigned)
 Cardiology Office Note    Date:  10/03/2024   ID:  [name] [name], DOB 1951-02-01, MRN [id number]  PCP:  Sharma Coyer, MD  Cardiologist:  Deatrice Cage, MD  Electrophysiologist:  None   Chief Complaint: Follow-up  History of Present Illness:   [name] [name] is a 73 y.o. male with history of CAD s/p PCI/DES to the LAD in 06/2019, PAF diagnosed in 2004 not on OAC in the setting of CHADS2VASc of 1, paroxysmal SVT, mild aortic valve regurgitation, diastolic dysfunction, hypothyroidism, severe possibly familial hyperlipidemia, and strong family history of premature coronary artery disease on his father's side who presents for follow up of CAD.   He was previously managed by Dr. Wayland in [place], [place].  He was initially diagnosed with A. fib in 2004 and was managed with metoprolol  though this was discontinued secondary to nightmares.  Since then, he has been managed with diltiazem .  Prior stress echo in 2013 was unremarkable.  Holter monitor in 12/2014 showed the predominant rhythm was sinus with an average heart rate of 71 bpm, minimum heart rate 45 bpm, maximum heart rate 118 bpm, rare isolated PVCs, frequent isolated PACs totaling 4587, 6 atrial couplets, 3 runs of SVT with the longest lasting 7 beats with a maximum rate of 141 bpm, no significant pauses.  No evidence of A. Fib.  He underwent treadmill stress test on 03/16/2018 that showed no evidence of ischemia with good exercise capacity with an exercise duration of 7 minutes and 48 seconds.  He was evaluated in 04/2019 for palpitations and chest pain.  Echo showed an EF of 55-60%, mild concentric LVH, normal diastolic function, normal RVSF, grossly normal mitral and tricuspid valves, mild AI.  Zio in 05/2019 showed NSR with an average heart rate of 73 bpm with 27 episodes of short SVT with the longest episode lasting 13 beats.  Coronary CTA in 06/2019 showed left main FFR 0.98, ostial LAD FFR 0.98, proximal LAD FFR 0.50, proximal LCx FFR  0.95, mid LCx FFR 0.78, OM1 FFR 0.77, proximal RCA FFR 0.98, mid RCA FFR 0.82, PDA FFR 0.79. Given this, he underwent diagnostic LHC on 06/26/2019 that showed severe single-vessel CAD with 80-90% proximal LAD stenosis with heavy calcification as well as mild to moderate nonobstructive CAD involving the mid LAD, mid LCx, and mid RCA with elevated LVEDP and normal LV contraction consistent with diastolic dysfunction. Given the heavy calcification of the LAD, it was recommended he undergo staged PCI with atherectomy. He underwent successful complex orbital atherectomy and 2 overlapping DES to the proximal and mid LAD extending into the ostium with balloon angioplasty of the ostial D2. Procedure was complicated by a non-flow limiting dissection in the ostial diagonal with normal flow that did not require treatment.    He was seen on 03/19/2022 noting some palpitations and chest/leg heaviness and fatigue that felt similar to what he experienced leading up to his PCI.  At the time of his visit, his palpitation burden had improved.  Subsequent Lexiscan  MPI on 03/25/2022 showed no evidence of ischemia with an EF of 56% and was overall low risk.  LAD and RCA calcifications were noted on CT imaging.    He was seen in the office in 04/2023 noting an increase in generalized malaise and fatigue that felt similar to what he experienced leading up to his PCI in 2020.  He was without frank chest pain or dyspnea.  LHC on 05/05/2023 showed patent LAD stents with minimal restenosis and stable mild to  moderate LCx and RCA disease with normal LV systolic function and mildly elevated LVEDP at 20 mmHg.  Medical therapy was recommended.  Echo on 05/27/2023 showed an EF of 55%, no regional wall motion abnormalities, normal LV diastolic function parameters, normal RV systolic function and ventricular cavity size, tricuspid aortic valve with mild to moderate insufficiency, borderline dilatation of the ascending aorta measuring 38 mm, and an  estimated right atrial pressure of 3 mmHg.   He was last seen in the office in 03/2024 and was doing well from a cardiac perspective, noting some fatigue in the lower extremities leading to a trial of of holding rosuvastatin .  However, this did not improve his fatigue leading to the reinitiation of rosuvastatin .  He underwent a trial of holding ezetimibe , though also did not notice a change in fatigue leading to the reinitiation of this.  Echo in 07/2024 showed an EF of 60 to 65%, no regional wall motion abnormalities, grade 1 diastolic dysfunction, normal RV systolic function and ventricular cavity size, aortic insufficiency, and estimated right atrial pressure of 3 mmHg, and normal size and structure aortic root.  He comes in today doing well from a cardiac perspective and is without symptoms of angina or cardiac decompensation.  Overall, he does feel like his underlying fatigue is somewhat improved today.  Adherent and tolerating cardiac pharmacotherapy without off target effect.  No falls, hematochezia, or melena.  No dizziness, presyncope, or syncope.  No lower extremity swelling.  Active at baseline.  Overall feels well from a cardiac perspective and does not have any acute cardiac concerns at this time.   Labs independently reviewed: 05/2024 - BUN 18, serum creatinine 0.99, potassium 3.9, albumin 4.6, AST/ALT normal, Hgb 14.8, PLT 187, TSH normal 03/2024 - TC 138, TG 121, HDL 43, LDL 71  Past Medical History:  Diagnosis Date   Acquired hypothyroidism    Aortic atherosclerosis    Basal cell carcinoma 08/13/2022   Left dorsal forearm. Superficial and nodular. Tx with [facility] 09/17/22   Basal cell carcinoma 03/24/2023   Left medial calf. Superficial BCC with focal infiltration. [facility] 04/29/23   Coronary artery disease    a.) LHC 06/26/2019: EF 55-60%; LVEDP 25-30%; 80 pLAD, 40% mLAD, 40% p-mLCx, 30% mRCA; staged PCI planned. b.) LHC/staged PCI 07/05/2019: 55% mLAD, 40% p-mLCx, 30% mRCA, 85% p-mLAD,  50% D2 --> orbital atherectomy and PCI performed placing overlapping 3.0 x 8 mm (pLAD) and 3.0 x 30 mm (mLAD) Resolute Onyx DES x 2   Diverticulosis    ED (erectile dysfunction)    a.) on PDE5i (tadalifil)   Elevated LFTs    GERD (gastroesophageal reflux disease)    HLD (hyperlipidemia)    Hypertension    Long term current use of antithrombotics/antiplatelets    a.) daily DAPT therapy (ASA + clopidogrel )   Melanoma (HCC)    Left shoulder. 1984   PAF (paroxysmal atrial fibrillation) (HCC) 2004   a.) CHA2DS2-VASc = 3 (age, HTN, aortic plaque). b.) rate/rhythm maintained on oral diltiazem ; on daily DAPT (ASA + clopidogrel )   Paroxysmal SVT (supraventricular tachycardia)    Pure hypercholesterolemia    Skin cancer    Left forearm. NMSC. Removed 2008 or prior    Past Surgical History:  Procedure Laterality Date   COLONOSCOPY WITH PROPOFOL  N/A 10/29/2023   Procedure: COLONOSCOPY WITH PROPOFOL ;  Surgeon: Maryruth Ole DASEN, MD;  Location: [facility];  Service: Endoscopy;  Laterality: N/A;   CORONARY ATHERECTOMY N/A 07/05/2019   Procedure: CORONARY ATHERECTOMY;  Surgeon:  Darron Deatrice LABOR, MD;  [facility];  Service: Cardiovascular;  Laterality: N/A;   CORONARY BALLOON ANGIOPLASTY N/A 07/05/2019   Procedure: CORONARY BALLOON ANGIOPLASTY;  Surgeon: Darron Deatrice LABOR, MD;  Location: [facility];  Service: Cardiovascular;  Laterality: N/A;   CORONARY STENT INTERVENTION N/A 07/05/2019   Procedure: CORONARY STENT INTERVENTION;  Surgeon: Darron Deatrice LABOR, MD;  Location: [facility];  Service: Cardiovascular;  Laterality: N/A;   EYE SURGERY Right 2014   Retina detachment   HEMOSTASIS CLIP PLACEMENT  10/29/2023   Procedure: HEMOSTASIS CLIP PLACEMENT;  Surgeon: Maryruth Ole DASEN, MD;  Location: [facility];  Service: Endoscopy;;   LEFT HEART CATH AND CORONARY ANGIOGRAPHY Left 06/26/2019   Procedure: LEFT HEART CATH AND CORONARY ANGIOGRAPHY;  Surgeon: Mady Bruckner, MD;  Location: [facility];  Service: Cardiovascular;  Laterality: Left;   LEFT HEART CATH AND CORONARY ANGIOGRAPHY N/A 05/05/2023   Procedure: LEFT HEART CATH AND CORONARY ANGIOGRAPHY;  Surgeon: Darron Deatrice LABOR, MD;  [facility]: [facility];  Service: Cardiovascular;  Laterality: N/A;   MELANOMA EXCISION  02/17/1983   NECK SURGERY  2004   POLYPECTOMY  10/29/2023   Procedure: POLYPECTOMY;  Surgeon: Maryruth Ole DASEN, MD;  Location: [facility];  Service: Endoscopy;;   SHOULDER ARTHROSCOPY WITH SUBACROMIAL DECOMPRESSION, ROTATOR CUFF REPAIR AND BICEP TENDON REPAIR Right 07/07/2022   Procedure: SUBACROMIAL DECOMPRESSION, ROTATOR CUFF REPAIR AND DISTAL CLAVICLE INCISION AND DEBRIDEMENT     distal clavicle incision, debridement;  Surgeon: Leora Lynwood SAUNDERS, MD;  Location: [facility];  Service: Orthopedics;  Laterality: Right;   XI ROBOTIC ASSISTED INGUINAL HERNIA REPAIR WITH MESH Bilateral 04/18/2020   Procedure: XI ROBOTIC ASSISTED INGUINAL HERNIA REPAIR WITH MESH;  Surgeon: Jordis Laneta FALCON, MD;  Location: [facility];  Service: General;  Laterality: Bilateral;    Current Medications: No outpatient medications have been marked as taking for the 10/03/24 encounter (Office Visit) with Abigail Bernardino HERO, PA-C.    Allergies:   Hydrocodone-acetaminophen  and Vicodin [hydrocodone-acetaminophen ]   Social History   Socioeconomic History   Marital status: Married    Spouse name: Elenor    Number of children: 2   Years of education: Not on file   Highest education level: Not on file  Occupational History   Not on file  Tobacco Use   Smoking status: Never    Passive exposure: Never   Smokeless tobacco: Never  Vaping Use   Vaping status: Never Used  Substance and Sexual Activity   Alcohol use: Yes    Comment: Occ.    Drug use: No   Sexual activity: Yes    Birth control/protection: None  Other Topics Concern   Not on file  Social History Narrative   Not on file   Social  Drivers of Health   Financial Resource Strain: Low Risk  (01/31/2024)   Overall Financial Resource Strain (CARDIA)    Difficulty of Paying Living Expenses: Not hard at all  Food Insecurity: No Food Insecurity (01/31/2024)   Hunger Vital Sign    Worried About Running Out of Food in the Last Year: Never true    Ran Out of Food in the Last Year: Never true  Transportation Needs: No Transportation Needs (01/31/2024)   PRAPARE - Administrator, Civil Service (Medical): No    Lack of Transportation (Non-Medical): No  Physical Activity: Sufficiently Active (01/31/2024)   Exercise Vital Sign    Days of Exercise per Week: 5  days    Minutes of Exercise per Session: 30 min  Stress: No Stress Concern Present (01/31/2024)   [employer] of Occupational Health - Occupational Stress Questionnaire    Feeling of Stress : Not at all  Social Connections: Socially Integrated (01/31/2024)   Social Connection and Isolation Panel    Frequency of Communication with Friends and Family: More than three times a week    Frequency of Social Gatherings with Friends and Family: More than three times a week    Attends Religious Services: More than 4 times per year    Active Member of [employer] or Organizations: Yes    Attends Engineer, Structural: More than 4 times per year    Marital Status: Married     Family History:  The patient's family history includes Atrial fibrillation in her mother; Heart disease in her father; Stroke in her father.  ROS:   12-point review of systems is negative unless otherwise noted in the HPI.   EKGs/Labs/Other Studies Reviewed:    Studies reviewed were summarized above. The additional studies were reviewed today:  2D echo 08/04/2024: 1. Left ventricular ejection fraction, by estimation, is 60 to 65%. The  left ventricle has normal function. The left ventricle has no regional  wall motion abnormalities. Left ventricular diastolic parameters are  consistent  with Grade I diastolic  dysfunction (impaired relaxation). The average left ventricular global  longitudinal strain is -20.1 %. The global longitudinal strain is normal.   2. Right ventricular systolic function is normal. The right ventricular  size is normal. Tricuspid regurgitation signal is inadequate for assessing  PA pressure.   3. The mitral valve is normal in structure. No evidence of mitral valve  regurgitation. No evidence of mitral stenosis.   4. The aortic valve is normal in structure. Aortic valve regurgitation is  mild. No aortic stenosis is present.   5. The inferior vena cava is normal in size with greater than 50%  respiratory variability, suggesting right atrial pressure of 3 mmHg.  __________  2D echo 05/31/2023: 1. Left ventricular ejection fraction, by estimation, is 55%. Left  ventricular ejection fraction by PLAX is 55 %. The left ventricle has  normal function. The left ventricle has no regional wall motion  abnormalities. Left ventricular diastolic parameters   were normal.   2. Right ventricular systolic function is normal. The right ventricular  size is normal.   3. The mitral valve is normal in structure. No evidence of mitral valve  regurgitation.   4. The aortic valve is tricuspid. Aortic valve regurgitation is mild to  moderate.   5. Aortic dilatation noted. There is borderline dilatation of the  ascending aorta, measuring 38 mm.   6. The inferior vena cava is normal in size with greater than 50%  respiratory variability, suggesting right atrial pressure of 3 mmHg.  __________   LHC 05/05/2023:   Mid RCA lesion is 30% stenosed.   2nd Diag lesion is 30% stenosed.   Prox Cx to Mid Cx lesion is 30% stenosed.   Mid LAD lesion is 10% stenosed.   Non-stenotic Prox LAD to Mid LAD lesion was previously treated.   The left ventricular systolic function is normal.   LV end diastolic pressure is mildly elevated.   The left ventricular ejection fraction is  55-65% by visual estimate.   1.  Widely patent LAD stents with minimal restenosis.  Stable mild to moderate left circumflex and RCA disease. 2.  Normal LV systolic function  with mildly elevated left ventricular end-diastolic pressure at 20 mmHg.   Recommendations: Continue aggressive medical therapy. __________   Lexiscan  MPI 03/25/2022:   . The study is low risk.   There is no evidence for ischemia   Left ventricular function is normal. LVEF = 56%   LAD and RCA calcifications noted. __________   2D Echo 04/2018: 1. The left ventricle has normal systolic function, with an ejection fraction of 55-60%. The cavity size was normal. There is mild concentric left ventricular hypertrophy. Left ventricular diastolic parameters were normal.  2. The right ventricle has normal systolic function. The cavity was normal. There is no increase in right ventricular wall thickness.  3. The mitral valve is grossly normal.  4. The tricuspid valve is grossly normal.  5. The aortic valve has an indeterminate number of cusps. Aortic valve regurgitation is mild by color flow Doppler. __________   CTA chest/abd/pelvis: 1. No dissection.  No PE.  No aortic aneurysm. 2. Atherosclerotic changes are noted throughout the thoracic and abdominal aorta. 3. No acute intra-abdominal abnormality. 4. Severe sigmoid diverticulosis without CT evidence of diverticulitis. 5. Enlarged prostate gland. Bilateral fat containing inguinal hernias are noted.   Zio 05/2019: Normal sinus rhythm with an average heart rate of 73 bpm. 27 episodes of short SVTs.  The longest lasted 13 beats. __________   Coronary CTA/FFR: 1. Left Main: 0.98. 2. LAD: Ostial: 0.98, proximal: 0.50. 3. LCX: Proximal: 0.95, mid: 078. 4. OM1: 0.77. 5. RCA: Proximal: 0.98, mid: 0.82, PDA: 0.79.   IMPRESSION: 1. CT FFR analysis showed severe stenosis in the proximal LAD, mid RCA, mid LCX artery. A cardiac catheterization is  recommended. __________   [facility] 06/2019: Conclusions: Severe single-vessel CAD with 80-90% proximal LAD stenosis with heavy calcification. Mild to moderate, non-obstructive CAD involving mid LAD, mid LCx, and mid RCA. Elevated LVEDP with normal left ventricular contraction consistent with diastolic dysfunction.   Recommendations: Plan for staged PCI to proximal LAD at [facility] using atherectomy.  Will start patient on dual antiplatelet therapy with aspirin  and clopidogrel .  Long-term, transitioning Mr. Micheals to NOAC + clopidogrel  will need to be considered with his history of paroxysmal atrial fibrillation and CHADSVASC score of at least 2 (age + CAD). Aggressive secondary prevention. __________   PCI 06/2019: Mid LAD lesion is 55% stenosed. Prox Cx to Mid Cx lesion is 40% stenosed. Mid RCA lesion is 30% stenosed. Prox LAD to Mid LAD lesion is 85% stenosed. 2nd Diag lesion is 50% stenosed. Post intervention, there is a 0% residual stenosis. A drug-eluting stent was successfully placed using a STENT RESOLUTE [id number]. Post intervention, there is a 0% residual stenosis. A drug-eluting stent was successfully placed using a STENT RESOLUTE ONYX 3.0X8. Post intervention, there is a 30% residual stenosis. Balloon angioplasty was performed using a BALLOON SAPPHIRE 2.5X20.   Successful complex orbital atherectomy and 2 overlapped drug-eluting stent placement to the mid and proximal LAD extending into the ostium with balloon angioplasty of the ostial second diagonal.  There was non-flow-limiting dissection in ostial diagonal with normal flow.  This did not require treatment.   Recommendations: Dual antiplatelet therapy for at least 1 year.  Aggressive treatment of risk factors.   EKG:  EKG is ordered today.  The EKG ordered today demonstrates NSR, 64 bpm, no acute ST-T changes  Recent Labs: 05/08/2024: Hemoglobin 14.8; Platelets 187; TSH 2.990 05/25/2024: ALT 27; BUN 18; Creatinine, Ser  0.99; Potassium 3.9; Sodium 140  Recent Lipid Panel  Component Value Date/Time   CHOL 138 03/09/2024 0841   CHOL 135 08/28/2020 0925   TRIG 121 03/09/2024 0841   HDL 43 03/09/2024 0841   HDL 46 08/28/2020 0925   CHOLHDL 3.2 03/09/2024 0841   VLDL 24 03/09/2024 0841   LDLCALC 71 03/09/2024 0841   LDLCALC 71 08/28/2020 0925    PHYSICAL EXAM:    VS:  BP (!) 115/58 (BP Location: Left Arm, Patient Position: Sitting, Cuff Size: Normal)   Pulse 64 Comment: 75 oximeter  Ht 6' (1.829 m)   Wt 215 lb 6.4 oz (97.7 kg)   SpO2 99%   BMI 29.21 kg/m   BMI: Body mass index is 29.21 kg/m.  Physical Exam Vitals reviewed.  Constitutional:      Appearance: She is well-developed.  HENT:     Head: Normocephalic and atraumatic.  Eyes:     General:        Right eye: No discharge.        Left eye: No discharge.  Cardiovascular:     Rate and Rhythm: Normal rate and regular rhythm.     Pulses:          Posterior tibial pulses are 2+ on the right side and 2+ on the left side.     Heart sounds: Normal heart sounds, S1 normal and S2 normal. Heart sounds not distant. No midsystolic click and no opening snap. No murmur heard.    No friction rub.  Pulmonary:     Effort: Pulmonary effort is normal. No respiratory distress.     Breath sounds: Normal breath sounds. No decreased breath sounds, wheezing, rhonchi or rales.  Musculoskeletal:     Cervical back: Normal range of motion.     Right lower leg: No edema.     Left lower leg: No edema.  Skin:    General: Skin is warm and dry.     Nails: There is no clubbing.  Neurological:     Mental Status: She is alert and oriented to person, place, and time.  Psychiatric:        Speech: Speech normal.        Behavior: Behavior normal.        Thought Content: Thought content normal.        Judgment: Judgment normal.     Wt Readings from Last 3 Encounters:  10/03/24 215 lb 6.4 oz (97.7 kg)  05/08/24 209 lb (94.8 kg)  03/13/24 210 lb 6.4 oz (95.4  kg)     ASSESSMENT & PLAN:   CAD involving the native coronary arteries with stable angina: He continues to do well and is without symptoms concerning for angina or cardiac decompensation.  Continue aggressive risk factor modification and secondary prevention including aspirin  81 mg, clopidogrel  75 mg, rosuvastatin  40 mg, and ezetimibe  10 mg.  No indication for further ischemic testing at this time.  PAF/PSVT: Quiescent.  Has not been maintained on anticoagulation due to transient A-fib in the remote past.  Remains on Cardizem  CD 120 mg twice daily.  Aortic insufficiency with borderline dilatation of the ascending aorta: No murmur noted on exam today.  Echo in 07/2024 showed preserved LV systolic function with mild aortic insufficiency.  Diastolic dysfunction: Euvolemic and well compensated.  Not requiring standing loop diuretic.  Continue optimal blood pressure control.  HLD with familial hyperlipidemia: LDL 71 in 03/2024 with normal AST/ALT in 05/2024.  Remains on rosuvastatin  40 mg and ezetimibe  10 mg.    Disposition: F/u with Dr. Darron  or an APP in 6 months.   Medication Adjustments/Labs and Tests Ordered: Current medicines are reviewed at length with the patient today.  Concerns regarding medicines are outlined above. Medication changes, Labs and Tests ordered today are summarized above and listed in the Patient Instructions accessible in Encounters.   Signed, Bernardino Bring, PA-C 10/03/2024 12:44 PM     [facility] [address] [place], [place] [address] [phone]

## 2024-10-03 ENCOUNTER — Encounter: Payer: Self-pay | Admitting: Physician Assistant

## 2024-10-03 ENCOUNTER — Ambulatory Visit: Attending: Physician Assistant | Admitting: Physician Assistant

## 2024-10-03 VITALS — BP 115/58 | HR 64 | Ht 72.0 in | Wt 215.4 lb

## 2024-10-03 DIAGNOSIS — E7849 Other hyperlipidemia: Secondary | ICD-10-CM

## 2024-10-03 DIAGNOSIS — I25118 Atherosclerotic heart disease of native coronary artery with other forms of angina pectoris: Secondary | ICD-10-CM

## 2024-10-03 DIAGNOSIS — I351 Nonrheumatic aortic (valve) insufficiency: Secondary | ICD-10-CM

## 2024-10-03 DIAGNOSIS — I5189 Other ill-defined heart diseases: Secondary | ICD-10-CM | POA: Diagnosis not present

## 2024-10-03 DIAGNOSIS — H40013 Open angle with borderline findings, low risk, bilateral: Secondary | ICD-10-CM | POA: Diagnosis not present

## 2024-10-03 DIAGNOSIS — Z8669 Personal history of other diseases of the nervous system and sense organs: Secondary | ICD-10-CM | POA: Diagnosis not present

## 2024-10-03 DIAGNOSIS — H35341 Macular cyst, hole, or pseudohole, right eye: Secondary | ICD-10-CM | POA: Diagnosis not present

## 2024-10-03 DIAGNOSIS — I471 Supraventricular tachycardia, unspecified: Secondary | ICD-10-CM

## 2024-10-03 DIAGNOSIS — E785 Hyperlipidemia, unspecified: Secondary | ICD-10-CM

## 2024-10-03 DIAGNOSIS — H43812 Vitreous degeneration, left eye: Secondary | ICD-10-CM | POA: Diagnosis not present

## 2024-10-03 NOTE — Patient Instructions (Signed)
 Medication Instructions:  Your physician recommends that you continue on your current medications as directed. Please refer to the Current Medication list given to you today.   *If you need a refill on your cardiac medications before your next appointment, please call your pharmacy*  Lab Work: None ordered at this time   Follow-Up: At [facility], you and your health needs are our priority.  As part of our continuing mission to provide you with exceptional heart care, our providers are all part of one team.  This team includes your primary Cardiologist (physician) and Advanced Practice Providers or APPs (Physician Assistants and Nurse Practitioners) who all work together to provide you with the care you need, when you need it.  Your next appointment:   6 month(s)  Provider:   You may see Deatrice Cage, MD or Bernardino Bring, PA-C

## 2024-10-10 ENCOUNTER — Other Ambulatory Visit: Payer: Self-pay | Admitting: Physician Assistant

## 2024-10-10 DIAGNOSIS — H2511 Age-related nuclear cataract, right eye: Secondary | ICD-10-CM | POA: Diagnosis not present

## 2024-10-13 ENCOUNTER — Ambulatory Visit
Admission: RE | Admit: 2024-10-13 | Discharge: 2024-10-13 | Disposition: A | Discharge status: Home / Self Care | Source: Ambulatory Visit | Attending: Emergency Medicine | Admitting: Emergency Medicine

## 2024-10-13 VITALS — BP 142/70 | HR 85 | Temp 97.6°F | Resp 16

## 2024-10-13 DIAGNOSIS — M546 Pain in thoracic spine: Secondary | ICD-10-CM

## 2024-10-13 DIAGNOSIS — M542 Cervicalgia: Secondary | ICD-10-CM | POA: Diagnosis not present

## 2024-10-13 DIAGNOSIS — M25511 Pain in right shoulder: Secondary | ICD-10-CM

## 2024-10-13 MED ORDER — BACLOFEN 5 MG PO TABS: 5.0000 mg | ORAL_TABLET | Freq: Three times a day (TID) | ORAL | 0 refills | Status: AC

## 2024-10-13 MED ORDER — PREDNISONE 10 MG (21) PO TBPK: ORAL_TABLET | Freq: Every day | ORAL | 0 refills | Status: DC
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
## 2024-10-13 NOTE — Discharge Instructions (Signed)
 Your pain is most likely caused by irritation to the muscles.  Begin prednisone  every morning with food as directed to reduce inflammation and help with pain, may take Tylenol  or any topical medicines additionally  May use muscle relaxant every 8 hours as needed for additional comfort  You may use heating pad in 15 minute intervals as needed for additional comfort, within the first 2-3 days you may find comfort in using ice in 10-15 minutes over affected area  Begin stretching affected area daily for 10 minutes as tolerated to further loosen muscles   When sitting and lying down place pillow all areas causing pain  Practice good posture: head back, shoulders back, chest forward, pelvis back and weight distributed evenly on both legs  If pain persist after recommended treatment or reoccurs if may be beneficial to follow up with orthopedic specialist for evaluation, this doctor specializes in the bones and can manage your symptoms long-term with options such as but not limited to imaging, medications or physical therapy

## 2024-10-25 ENCOUNTER — Ambulatory Visit

## 2024-10-26 DIAGNOSIS — H43392 Other vitreous opacities, left eye: Secondary | ICD-10-CM | POA: Diagnosis not present

## 2024-10-26 DIAGNOSIS — H35341 Macular cyst, hole, or pseudohole, right eye: Secondary | ICD-10-CM | POA: Diagnosis not present

## 2024-10-26 DIAGNOSIS — Z9889 Other specified postprocedural states: Secondary | ICD-10-CM | POA: Diagnosis not present

## 2024-10-29 ENCOUNTER — Other Ambulatory Visit: Payer: Self-pay | Admitting: Family Medicine

## 2024-10-29 DIAGNOSIS — E039 Hypothyroidism, unspecified: Secondary | ICD-10-CM

## 2024-11-01 ENCOUNTER — Ambulatory Visit: Admitting: Emergency Medicine

## 2024-11-01 VITALS — Ht 72.0 in | Wt 206.0 lb

## 2024-11-01 DIAGNOSIS — Z0000 Encounter for general adult medical examination without abnormal findings: Secondary | ICD-10-CM | POA: Diagnosis not present

## 2024-11-01 NOTE — Patient Instructions (Signed)
 [name],  Thank you for taking the time for your Medicare Wellness Visit. I appreciate your continued commitment to your health goals. Please review the care plan we discussed, and feel free to reach out if I can assist you further.  Please note that Annual Wellness Visits do not include a physical exam. Some assessments may be limited, especially if the visit was conducted virtually. If needed, we may recommend an in-person follow-up with your provider.  Ongoing Care Seeing your primary care provider every 3 to 6 months helps us  monitor your health and provide consistent, personalized care. I have made you an appointment with Dr. Sharma for 02/06/25 @ 9:20am.  Referrals If a referral was made during today's visit and you haven't received any updates within two weeks, please contact the referred provider directly to check on the status.  Recommended Screenings:  You may get the shingles and tetanus vaccines at your local pharmacy at your convenience.  You may get the pneumonia vaccine at your doctor's office at your convenience.  Keep up the good work!!    Health Maintenance  Topic Date Due   Hepatitis C Screening  Never done   DTaP/Tdap/Td vaccine (1 - Tdap) Never done   Pneumococcal Vaccine for age over 68 (1 of 2 - PCV) Never done   Zoster (Shingles) Vaccine (1 of 2) Never done   Medicare Annual Wellness Visit  04/01/2024   COVID-19 Vaccine (4 - 2025-26 season) 08/07/2024   Colon Cancer Screening  10/28/2026   Flu Shot  Completed   Meningitis B Vaccine  Aged Out       11/01/2024   10:27 AM  Advanced Directives  Does Patient Have a Medical Advance Directive? Yes  Type of Advance Directive Healthcare Power of Attorney  Does patient want to make changes to medical advance directive? No - Patient declined  Copy of Healthcare Power of Attorney in Chart? No - copy requested    Vision: Annual vision screenings are recommended for early detection of glaucoma,  cataracts, and diabetic retinopathy. These exams can also reveal signs of chronic conditions such as diabetes and high blood pressure.  Dental: Annual dental screenings help detect early signs of oral cancer, gum disease, and other conditions linked to overall health, including heart disease and diabetes.  Please see the attached documents for additional preventive care recommendations.

## 2024-11-01 NOTE — Progress Notes (Signed)
 Chief Complaint  Patient presents with   Medicare Wellness     Subjective:   [name] [name] is a 73 y.o. male who presents for a Medicare Annual Wellness Visit.  Allergies (verified) Hydrocodone-acetaminophen , Nsaids, and Vicodin [hydrocodone-acetaminophen ]   History: Past Medical History:  Diagnosis Date   Acquired hypothyroidism    Aortic atherosclerosis    Basal cell carcinoma 08/13/2022   Left dorsal forearm. Superficial and nodular. Tx with [facility] 09/17/22   Basal cell carcinoma 03/24/2023   Left medial calf. Superficial BCC with focal infiltration. [facility] 04/29/23   Coronary artery disease    a.) LHC 06/26/2019: EF 55-60%; LVEDP 25-30%; 80 pLAD, 40% mLAD, 40% p-mLCx, 30% mRCA; staged PCI planned. b.) LHC/staged PCI 07/05/2019: 55% mLAD, 40% p-mLCx, 30% mRCA, 85% p-mLAD, 50% D2 --> orbital atherectomy and PCI performed placing overlapping 3.0 x 8 mm (pLAD) and 3.0 x 30 mm (mLAD) Resolute Onyx DES x 2   Diverticulosis    ED (erectile dysfunction)    a.) on PDE5i (tadalifil)   Elevated LFTs    GERD (gastroesophageal reflux disease)    HLD (hyperlipidemia)    Hypertension    Long term current use of antithrombotics/antiplatelets    a.) daily DAPT therapy (ASA + clopidogrel )   Melanoma (HCC)    Left shoulder. 1984   PAF (paroxysmal atrial fibrillation) (HCC) 2004   a.) CHA2DS2-VASc = 3 (age, HTN, aortic plaque). b.) rate/rhythm maintained on oral diltiazem ; on daily DAPT (ASA + clopidogrel )   Paroxysmal SVT (supraventricular tachycardia)    Pure hypercholesterolemia    Skin cancer    Left forearm. NMSC. Removed 2008 or prior   Past Surgical History:  Procedure Laterality Date   COLONOSCOPY WITH PROPOFOL  N/A 10/29/2023   Procedure: COLONOSCOPY WITH PROPOFOL ;  Surgeon: Maryruth Ole DASEN, MD;  Location: [facility] ENDOSCOPY;  Service: Endoscopy;  Laterality: N/A;   CORONARY ATHERECTOMY N/A 07/05/2019   Procedure: CORONARY ATHERECTOMY;  Surgeon: Darron Deatrice LABOR, MD;  [facility];  Service: Cardiovascular;  Laterality: N/A;   CORONARY BALLOON ANGIOPLASTY N/A 07/05/2019   Procedure: CORONARY BALLOON ANGIOPLASTY;  Surgeon: Darron Deatrice LABOR, MD;  [facility]: [facility];  Service: Cardiovascular;  Laterality: N/A;   CORONARY STENT INTERVENTION N/A 07/05/2019   Procedure: CORONARY STENT INTERVENTION;  Surgeon: Darron Deatrice LABOR, MD;  [facility]: [facility];  Service: Cardiovascular;  Laterality: N/A;   EYE SURGERY Right 2014   Retina detachment   HEMOSTASIS CLIP PLACEMENT  10/29/2023   Procedure: HEMOSTASIS CLIP PLACEMENT;  Surgeon: Maryruth Ole DASEN, MD;  Location: [facility] ENDOSCOPY;  Service: Endoscopy;;   LEFT HEART CATH AND CORONARY ANGIOGRAPHY Left 06/26/2019   Procedure: LEFT HEART CATH AND CORONARY ANGIOGRAPHY;  Surgeon: Mady Bruckner, MD;  Location: [facility] CV LAB;  Service: Cardiovascular;  Laterality: Left;   LEFT HEART CATH AND CORONARY ANGIOGRAPHY N/A 05/05/2023   Procedure: LEFT HEART CATH AND CORONARY ANGIOGRAPHY;  Surgeon: Darron Deatrice LABOR, MD;  [facility];  Service: Cardiovascular;  Laterality: N/A;   MELANOMA EXCISION  02/17/1983   NECK SURGERY  2004   POLYPECTOMY  10/29/2023   Procedure: POLYPECTOMY;  Surgeon: Maryruth Ole DASEN, MD;  Location: [facility]OS[facility];  Service: Endoscopy;;   SHOULDER ARTHROSCOPY WITH SUBACROMIAL DECOMPRESSION, ROTATOR CUFF REPAIR AND BICEP TENDON REPAIR Right 07/07/2022   Procedure: SUBACROMIAL DECOMPRESSION, ROTATOR CUFF REPAIR AND DISTAL CLAVICLE INCISION AND DEBRIDEMENT     distal clavicle incision, debridement;  Surgeon: Leora Lynwood SAUNDERS, MD;  Location:  [facility] OR[facility];  Service: Orthopedics;  Laterality: Right;   XI ROBOTIC ASSISTED INGUINAL HERNIA REPAIR WITH MESH Bilateral 04/18/2020   Procedure: XI ROBOTIC ASSISTED INGUINAL HERNIA REPAIR WITH MESH;  Surgeon: Jordis Laneta FALCON, MD;  Location: [facility] OR[facility];  Service: General;  Laterality: Bilateral;   Family History  Problem Relation  Age of Onset   Atrial fibrillation Mother    Stroke Father    Heart disease Father    Social History   Occupational History   Not on file  Tobacco Use   Smoking status: Never    Passive exposure: Never   Smokeless tobacco: Never  Vaping Use   Vaping status: Never Used  Substance and Sexual Activity   Alcohol use: Yes    Alcohol/week: 1.0 standard drink of alcohol    Types: 1 Shots of liquor per week    Comment: 2 whiskey drinks once a week   Drug use: No   Sexual activity: Yes    Birth control/protection: None   Tobacco Counseling Counseling given: Not Answered  SDOH Screenings   Food Insecurity: No Food Insecurity (11/01/2024)  Housing: Low Risk  (11/01/2024)  Transportation Needs: No Transportation Needs (11/01/2024)  Utilities: Not At Risk (11/01/2024)  Alcohol Screen: Low Risk  (04/02/2023)  Depression (PHQ2-9): Low Risk  (11/01/2024)  Financial Resource Strain: Low Risk  (01/31/2024)  Physical Activity: Sufficiently Active (11/01/2024)  Social Connections: Socially Integrated (11/01/2024)  Stress: No Stress Concern Present (11/01/2024)  Tobacco Use: Low Risk  (11/01/2024)  Health Literacy: Adequate Health Literacy (11/01/2024)   See flowsheets for full screening details  Depression Screen PHQ 2 & 9 Depression Scale- Over the past 2 weeks, how often have you been bothered by any of the following problems? Little interest or pleasure in doing things: 0 Feeling down, depressed, or hopeless (PHQ Adolescent also includes...irritable): 0 PHQ-2 Total Score: 0 Trouble falling or staying asleep, or sleeping too much: 0 Feeling tired or having little energy: 0 Poor appetite or overeating (PHQ Adolescent also includes...weight loss): 0 Feeling bad about yourself - or that you are a failure or have let yourself or your family down: 0 Trouble concentrating on things, such as reading the newspaper or watching television (PHQ Adolescent also includes...like school work):  0 Moving or speaking so slowly that other people could have noticed. Or the opposite - being so fidgety or restless that you have been moving around a lot more than usual: 0 Thoughts that you would be better off dead, or of hurting yourself in some way: 0 PHQ-9 Total Score: 0 If you checked off any problems, how difficult have these problems made it for you to do your work, take care of things at home, or get along with other people?: Not difficult at all  Depression Treatment Depression Interventions/Treatment : EYV7-0 Score <4 Follow-up Not Indicated     Goals Addressed               This Visit's Progress     Weight (lb) < 200 lb (90.7 kg) (pt-stated)   206 lb (93.4 kg)      Visit info / Clinical Intake: Medicare Wellness Visit Type:: Subsequent Annual Wellness Visit Persons participating in visit:: patient Medicare Wellness Visit Mode:: Video Because this visit was a virtual/telehealth visit:: pt reported vitals If Telephone or Video please confirm:: I connected with the patient using audio enabled telemedicine application and verified that I am speaking with the correct person using two identifiers; I discussed the limitations of evaluation  and management by telemedicine; The patient expressed understanding and agreed to proceed Patient Location:: work Dispensing Optician:: home office Information given by:: patient Interpreter Needed?: No Pre-visit prep was completed: yes AWV questionnaire completed by patient prior to visit?: no Living arrangements:: lives with spouse/significant other Patient's Overall Health Status Rating: excellent Typical amount of pain: none Does pain affect daily life?: no Are you currently prescribed opioids?: no  Dietary Habits and Nutritional Risks How many meals a day?: 3 Eats fruit and vegetables daily?: yes Most meals are obtained by: having others provide food (wife prepares meals) In the last 2 weeks, have you had any of the following?:  none Diabetic:: no  Functional Status Activities of Daily Living (to include ambulation/medication): Independent Ambulation: Independent with device- listed below Home Assistive Devices/Equipment: Eyeglasses; Contact lenses; Other (Comment) (glasses for reading, hearing aids) Medication Administration: Independent Home Management: Independent Manage your own finances?: yes Primary transportation is: driving Concerns about vision?: no *vision screening is required for WTM* Concerns about hearing?: no  Fall Screening Falls in the past year?: 0 Number of falls in past year: 0 Was there an injury with Fall?: 0 Fall Risk Category Calculator: 0 Patient Fall Risk Level: Low Fall Risk  Fall Risk Patient at Risk for Falls Due to: No Fall Risks Fall risk Follow up: Falls evaluation completed  Home and Transportation Safety: All rugs have non-skid backing?: yes All stairs or steps have railings?: N/A, no stairs Grab bars in the bathtub or shower?: (!) no Have non-skid surface in bathtub or shower?: yes Good home lighting?: yes Regular seat belt use?: yes Hospital stays in the last year:: no  Cognitive Assessment Difficulty concentrating, remembering, or making decisions? : no Will 6CIT or Mini Cog be Completed: yes What year is it?: 0 points What month is it?: 0 points Give patient an address phrase to remember (5 components): [address] [place] About what time is it?: 0 points Count backwards from 20 to 1: 0 points Say the months of the year in reverse: 0 points Repeat the address phrase from earlier: 0 points 6 CIT Score: 0 points  Advance Directives (For Healthcare) Does Patient Have a Medical Advance Directive?: Yes Does patient want to make changes to medical advance directive?: No - Patient declined Type of Advance Directive: Healthcare Power of Attorney Copy of Healthcare Power of Attorney in Chart?: No - copy requested  Reviewed/Updated  Reviewed/Updated:  Reviewed All (Medical, Surgical, Family, Medications, Allergies, Care Teams, Patient Goals)        Objective:    Today's Vitals   11/01/24 1019  Weight: 206 lb (93.4 kg)  Height: 6' (1.829 m)   Body mass index is 27.94 kg/m.  Current Medications (verified) Outpatient Encounter Medications as of 11/01/2024  Medication Sig   acetaminophen  (TYLENOL ) 500 MG tablet Take 500-1,000 mg by mouth every 6 (six) hours as needed (pain.).   aspirin  EC 81 MG tablet Take 81 mg by mouth in the morning.   cetirizine (ZYRTEC) 10 MG tablet Take 10 mg by mouth at bedtime.    clopidogrel  (PLAVIX ) 75 MG tablet Take 1 tablet by mouth once daily   diltiazem  (CARDIZEM  CD) 120 MG 24 hr capsule TAKE 1 CAPSULE BY MOUTH IN THE MORNING AND 1 AT BEDTIME   ezetimibe  (ZETIA ) 10 MG tablet TAKE 1 TABLET BY MOUTH ONCE DAILY IN THE EVENING   famotidine (PEPCID) 20 MG tablet Take 20 mg by mouth daily before supper.   fluticasone (FLONASE) 50 MCG/ACT  nasal spray Place 1 spray into both nostrils in the morning.   levothyroxine  (SYNTHROID ) 25 MCG tablet TAKE 1 TABLET BY MOUTH ONCE DAILY BEFORE BREAKFAST   Misc Natural Products (OSTEO BI-FLEX TRIPLE STRENGTH PO) Take 2 tablets by mouth in the morning.   Multiple Vitamin (MULTIVITAMIN WITH MINERALS) TABS tablet Take 1 tablet by mouth in the morning.   nitroGLYCERIN  (NITROSTAT ) 0.4 MG SL tablet Place 1 tablet (0.4 mg total) under the tongue every 5 (five) minutes as needed for chest pain.   Nutritional Supplements (FRUIT & VEGETABLE DAILY) CAPS Take 6 capsules by mouth in the morning. Balance of Nature Fruits (3) & Veggies (3)   rosuvastatin  (CRESTOR ) 40 MG tablet Take 1 tablet by mouth once daily   sildenafil  (VIAGRA ) 100 MG tablet Take 1 tablet (100 mg total) by mouth daily as needed for erectile dysfunction. Take two hours prior to intercourse on an empty stomach   Turmeric (QC TUMERIC COMPLEX PO) Take 1 tablet by mouth daily.   baclofen  5 MG TABS Take 1 tablet (5 mg total)  by mouth 3 (three) times daily. (Patient not taking: Reported on 11/01/2024)   predniSONE  (STERAPRED UNI-PAK 21 TAB) 10 MG (21) TBPK tablet Take by mouth daily. Take 6 tabs by mouth daily  for 1 days, then 5 tabs for 1 days, then 4 tabs for 1 days, then 3 tabs for 1 days, 2 tabs for 1 days, then 1 tab by mouth daily for 1 days (Patient not taking: Reported on 11/01/2024)   promethazine -dextromethorphan (PROMETHAZINE -DM) 6.25-15 MG/5ML syrup Take 5 mLs by mouth 3 (three) times daily as needed for cough. (Patient not taking: Reported on 11/01/2024)   No facility-administered encounter medications on file as of 11/01/2024.   Hearing/Vision screen Hearing Screening - Comments:: Wears hearing aids Vision Screening - Comments:: UTD with Dr. Francis Mallick [place] [place] Immunizations and Health Maintenance Health Maintenance  Topic Date Due   Hepatitis C Screening  Never done   DTaP/Tdap/Td (1 - Tdap) Never done   Pneumococcal Vaccine: 50+ Years (1 of 2 - PCV) Never done   Zoster Vaccines- Shingrix (1 of 2) Never done   COVID-19 Vaccine (4 - 2025-26 season) 08/07/2024   Medicare Annual Wellness (AWV)  11/01/2025   Colonoscopy  10/28/2026   Influenza Vaccine  Completed   Meningococcal B Vaccine  Aged Out        Assessment/Plan:  This is a routine wellness examination for Ashan.  Patient Care Team: Sharma Coyer, MD as PCP - General (Family Medicine) Darron Deatrice LABOR, MD as PCP - Cardiology (Cardiology) Hester Alm BROCKS, MD (Dermatology) Mallick Francis NOVAK, OD [facility])  I have personally reviewed and noted the following in the patient's chart:   Medical and social history Use of alcohol, tobacco or illicit drugs  Current medications and supplements including opioid prescriptions. Functional ability and status Nutritional status Physical activity Advanced directives List of other physicians Hospitalizations, surgeries, and ER visits in previous 12 months Vitals Screenings  to include cognitive, depression, and falls Referrals and appointments  No orders of the defined types were placed in this encounter.  In addition, I have reviewed and discussed with patient certain preventive protocols, quality metrics, and best practice recommendations. A written personalized care plan for preventive services as well as general preventive health recommendations were provided to patient.   Vina Ned, CMA   11/01/2024   Return in 1 year (on 11/13/2025).  After Visit Summary: (MyChart) Due to this being a telephonic  visit, the after visit summary with patients personalized plan was offered to patient via MyChart   Nurse Notes:  I scheduled a chronic f/u OV for 02/06/25 (last seen 05/08/24) Needs Tdap and Shingles vaccines (pharmacy) Needs pneumonia vaccine Declined Covid vaccine

## 2024-11-24 ENCOUNTER — Telehealth: Admitting: Physician Assistant

## 2024-11-24 DIAGNOSIS — J029 Acute pharyngitis, unspecified: Secondary | ICD-10-CM | POA: Diagnosis not present

## 2024-11-24 DIAGNOSIS — J040 Acute laryngitis: Secondary | ICD-10-CM

## 2024-11-24 MED ORDER — PREDNISONE 20 MG PO TABS: 40.0000 mg | ORAL_TABLET | Freq: Every day | ORAL | 0 refills | Status: AC

## 2024-11-24 MED ORDER — LIDOCAINE VISCOUS HCL 2 % MT SOLN: 5.0000 mL | Freq: Four times a day (QID) | OROMUCOSAL | 0 refills | Status: AC | PRN

## 2024-11-24 NOTE — Progress Notes (Signed)
 We are sorry that you are not feeling well.  Here is how we plan to help!  Your symptoms indicate a likely viral infection (Pharyngitis).   Pharyngitis is inflammation in the back of the throat which can cause a sore throat, scratchiness and sometimes difficulty swallowing.   Pharyngitis is typically caused by a respiratory virus and will just run its course.  Please keep in mind that your symptoms could last up to 10 days.    For throat pain, we recommend over the counter oral pain relief medications such as acetaminophen  or aspirin, or anti-inflammatory medications such as ibuprofen or naproxen  sodium.  Topical treatments such as oral throat lozenges or sprays may be used as needed. For throat pain, I have prescribed a Viscous Lidocaine  2% solution. Swallow 5-10 mL every 4-6 hours as needed for sore throat. DO NOT eat or drink anything for 15-20 minutes after swallowing to allow the medication to coat the throat.[name] [name]  Avoid close contact with loved ones, especially the very young and elderly.  Remember to wash your hands thoroughly throughout the day as this is the number one way to prevent the spread of infection! We also recommend that you periodically wipe down door knobs and counters with disinfectant.  After careful review of your answers, I would not recommend and antibiotic for your condition.  Antibiotics should not be used to treat conditions that we suspect are caused by viruses like the virus that causes the common cold or flu.  Providers prescribe antibiotics to treat infections caused by bacteria. Antibiotics are very powerful in treating bacterial infections when they are used properly.  To maintain their effectiveness, they should be used only when necessary.  Overuse of antibiotics has resulted in the development of super bugs that are resistant to treatment!    Some people with strep throat, however, can have atypical symptoms. As such, if anything continued to progress despite treatment  recommendations, you may need formal testing in clinic or office.  Home Care: Only take medications as instructed by your medical team. Do not drink alcohol while taking these medications. A steam or ultrasonic humidifier can help congestion.  You can place a towel over your head and breathe in the steam from hot water coming from a faucet. Avoid close contacts especially the very young and the elderly. Cover your mouth when you cough or sneeze. Always remember to wash your hands.  Get Help Right Away If: You develop worsening fever or throat pain. You develop a severe head ache or visual changes. Your symptoms persist after you have completed your treatment plan.  Make sure you Understand these instructions. Will watch your condition. Will get help right away if you are not doing well or get worse.  Your e-visit answers were reviewed by a board certified advanced clinical practitioner to complete your personal care plan.  Depending on the condition, your plan could have included both over the counter or prescription medications.  If there is a problem please reply once you have received a response from your provider.  Your safety is important to us .  If you have drug allergies check your prescription carefully.    You can use MyChart to ask questions about todays visit, request a non-urgent call back, or ask for a work or school excuse for 24 hours related to this e-Visit. If it has been greater than 24 hours you will need to follow up with your provider, or enter a new e-Visit to address those concerns.  You will get an e-mail in the next two days asking about your experience.  I hope that your e-visit has been valuable and will speed your recovery. Thank you for using e-visits.   I have spent 5 minutes in review of e-visit questionnaire, review and updating patient chart, medical decision making and response to patient.   [name] CHRISTELLA Dickinson, PA-C

## 2024-11-24 NOTE — Addendum Note (Signed)
 Addended by: VIVIENNE DELON HERO on: 11/24/2024 10:45 AM   Modules accepted: Orders

## 2024-12-04 ENCOUNTER — Telehealth: Admitting: Emergency Medicine

## 2024-12-04 DIAGNOSIS — B9689 Other specified bacterial agents as the cause of diseases classified elsewhere: Secondary | ICD-10-CM | POA: Diagnosis not present

## 2024-12-04 DIAGNOSIS — J019 Acute sinusitis, unspecified: Secondary | ICD-10-CM | POA: Diagnosis not present

## 2024-12-04 MED ORDER — AMOXICILLIN-POT CLAVULANATE 875-125 MG PO TABS: 1.0000 | ORAL_TABLET | Freq: Two times a day (BID) | ORAL | 0 refills | Status: AC

## 2024-12-04 NOTE — Patient Instructions (Signed)
 " [name] [name], thank you for joining [name] CHRISTELLA Belt, NP for today's virtual visit.  While this provider is not your primary care provider (PCP), if your PCP is located in our provider database this encounter information will be shared with them immediately following your visit.   A [name] [name] MyChart account gives you access to today's visit and all your visits, tests, and labs performed at [facility]  click here if you don't have a [place] MyChart account or go to [email or web address]  Consent: (Patient) [name] [name] provided verbal consent for this virtual visit at the beginning of the encounter.  Current Medications:  Current Outpatient Medications:    amoxicillin-clavulanate (AUGMENTIN) 875-125 MG tablet, Take 1 tablet by mouth 2 (two) times daily., Disp: 14 tablet, Rfl: 0   acetaminophen  (TYLENOL ) 500 MG tablet, Take 500-1,000 mg by mouth every 6 (six) hours as needed (pain.)., Disp: , Rfl:    aspirin  EC 81 MG tablet, Take 81 mg by mouth in the morning., Disp: , Rfl:    baclofen  5 MG TABS, Take 1 tablet (5 mg total) by mouth 3 (three) times daily. (Patient not taking: Reported on 11/01/2024), Disp: 20 tablet, Rfl: 0   cetirizine (ZYRTEC) 10 MG tablet, Take 10 mg by mouth at bedtime. , Disp: , Rfl:    clopidogrel  (PLAVIX ) 75 MG tablet, Take 1 tablet by mouth once daily, Disp: 90 tablet, Rfl: 3   diltiazem  (CARDIZEM  CD) 120 MG 24 hr capsule, TAKE 1 CAPSULE BY MOUTH IN THE MORNING AND 1 AT BEDTIME, Disp: 180 capsule, Rfl: 3   ezetimibe  (ZETIA ) 10 MG tablet, TAKE 1 TABLET BY MOUTH ONCE DAILY IN THE EVENING, Disp: 90 tablet, Rfl: 3   famotidine (PEPCID) 20 MG tablet, Take 20 mg by mouth daily before supper., Disp: , Rfl:    fluticasone (FLONASE) 50 MCG/ACT nasal spray, Place 1 spray into both nostrils in the morning., Disp: , Rfl:    levothyroxine  (SYNTHROID ) 25 MCG tablet, TAKE 1 TABLET BY MOUTH ONCE DAILY BEFORE BREAKFAST, Disp: 90 tablet, Rfl: 0   lidocaine   (XYLOCAINE ) 2 % solution, Use as directed 5-10 mLs in the mouth or throat every 6 (six) hours as needed (sore throat)., Disp: 100 mL, Rfl: 0   Misc Natural Products (OSTEO BI-FLEX TRIPLE STRENGTH PO), Take 2 tablets by mouth in the morning., Disp: , Rfl:    Multiple Vitamin (MULTIVITAMIN WITH MINERALS) TABS tablet, Take 1 tablet by mouth in the morning., Disp: , Rfl:    nitroGLYCERIN  (NITROSTAT ) 0.4 MG SL tablet, Place 1 tablet (0.4 mg total) under the tongue every 5 (five) minutes as needed for chest pain., Disp: 25 tablet, Rfl: 3   Nutritional Supplements (FRUIT & VEGETABLE DAILY) CAPS, Take 6 capsules by mouth in the morning. Balance of Nature Fruits (3) & Veggies (3), Disp: , Rfl:    predniSONE  (DELTASONE ) 20 MG tablet, Take 2 tablets (40 mg total) by mouth daily with breakfast., Disp: 10 tablet, Rfl: 0   promethazine -dextromethorphan (PROMETHAZINE -DM) 6.25-15 MG/5ML syrup, Take 5 mLs by mouth 3 (three) times daily as needed for cough. (Patient not taking: Reported on 11/01/2024), Disp: 118 mL, Rfl: 0   rosuvastatin  (CRESTOR ) 40 MG tablet, Take 1 tablet by mouth once daily, Disp: 90 tablet, Rfl: 3   sildenafil  (VIAGRA ) 100 MG tablet, Take 1 tablet (100 mg total) by mouth daily as needed for erectile dysfunction. Take two hours prior to intercourse on an empty stomach, Disp: 30 tablet, Rfl: 3   Turmeric (QC  TUMERIC COMPLEX PO), Take 1 tablet by mouth daily., Disp: , Rfl:    Medications ordered in this encounter:  Meds ordered this encounter  Medications   amoxicillin-clavulanate (AUGMENTIN) 875-125 MG tablet    Sig: Take 1 tablet by mouth 2 (two) times daily.    Dispense:  14 tablet    Refill:  0     *If you need refills on other medications prior to your next appointment, please contact your pharmacy*  Follow-Up: Call back or seek an in-person evaluation if the symptoms worsen or if the condition fails to improve as anticipated.  [facility] [phone]  Other  Instructions Continue mucinex and flonase. Consider also use saline nasal spray.    If you have been instructed to have an in-person evaluation today at a local Urgent Care facility, please use the link below. It will take you to a list of all of our available [facility], including address, phone number and hours of operation. Please do not delay care.  [facility]  If you or a family member do not have a primary care provider, use the link below to schedule a visit and establish care. When you choose a [facility] [place] primary care physician or advanced practice provider, you gain a long-term partner in health. Find a Primary Care Provider  Learn more about [facility]'s in-office and virtual care options: [facility] Get Care Now  "

## 2024-12-04 NOTE — Progress Notes (Signed)
 " Virtual Visit Consent   [name], you are scheduled for a virtual visit with a [facility] provider today. Just as with appointments in the office, your consent must be obtained to participate. Your consent will be active for this visit and any virtual visit you may have with one of our providers in the next 365 days. If you have a MyChart account, a copy of this consent can be sent to you electronically.  As this is a virtual visit, video technology does not allow for your provider to perform a traditional examination. This may limit your provider's ability to fully assess your condition. If your provider identifies any concerns that need to be evaluated in person or the need to arrange testing (such as labs, EKG, etc.), we will make arrangements to do so. Although advances in technology are sophisticated, we cannot ensure that it will always work on either your end or our end. If the connection with a video visit is poor, the visit may have to be switched to a telephone visit. With either a video or telephone visit, we are not always able to ensure that we have a secure connection.  By engaging in this virtual visit, you consent to the provision of healthcare and authorize for your insurance to be billed (if applicable) for the services provided during this visit. Depending on your insurance coverage, you may receive a charge related to this service.  I need to obtain your verbal consent now. Are you willing to proceed with your visit today? [name] [name] has provided verbal consent on 12/04/2024 for a virtual visit (video or telephone). [name], NP  Date: 12/04/2024 2:03 PM   Virtual Visit via Video Note   I, [name], connected with  [name] [name]  ([id number], [birth year]) on 12/04/2024 at  2:00 PM EST by a video-enabled telemedicine application and verified that I am speaking with the correct person using two identifiers.  Location: Patient: Virtual Visit Location Patient:  Other: work in [employer] Provider: Virtual Visit Location Provider: Home Office   I discussed the limitations of evaluation and management by telemedicine and the availability of in person appointments. The patient expressed understanding and agreed to proceed.    History of Present Illness: [name] [name] is a 73 y.o. who identifies as a male who was assigned male at birth, and is being seen today for possible sinus infection. B frontal sinus pressure.   Sick for 2 weeks, seen by ev on 12/19 for sore throat. Took prednisone  and it helped a little. Now has a cough; barky cough and nasal discharge that is yellowish.   Taking mucinex and tylenol  for headache. Using flonase twice daily.   HPI: HPI  Problems:  Patient Active Problem List   Diagnosis Date Noted   Encounter for subsequent annual wellness visit (AWV) in Medicare patient 04/04/2023   Annual physical exam 04/04/2023   Hearing loss 04/04/2023   COVID-19 06/02/2022   Acute pain of right shoulder 11/12/2021   Hypothyroidism 11/12/2021   Gastroesophageal reflux disease 11/12/2021   Atherosclerotic heart disease of native coronary artery with other forms of angina pectoris 11/12/2021   Erectile dysfunction due to arterial insufficiency 10/16/2020   Benign prostatic hyperplasia with urinary frequency 10/16/2020   Left inguinal hernia 03/25/2020   SVT (supraventricular tachycardia) 07/06/2019   NSVT (nonsustained ventricular tachycardia) (HCC) 07/06/2019   Status post coronary artery stent placement    Hyperlipidemia LDL goal <70    Effort angina 07/05/2019  Accelerating angina (HCC) 06/20/2019   Acquired hypothyroidism 04/02/2016   Paroxysmal atrial fibrillation (HCC) 12/14/2014   Dyslipidemia 12/14/2014    Allergies: Allergies[1] Medications: Current Medications[2]  Observations/Objective: Patient is well-developed, well-nourished in no acute distress.  Resting comfortably  at work.  Head is normocephalic, atraumatic.  No  labored breathing.  Speech is clear and coherent with logical content.  Patient is alert and oriented at baseline.    Assessment and Plan: 1. Acute bacterial sinusitis (Primary) - amoxicillin-clavulanate (AUGMENTIN) 875-125 MG tablet; Take 1 tablet by mouth 2 (two) times daily.  Dispense: 14 tablet; Refill: 0  Sick for 2 weeks, sx consistent with sinus infection.   Follow Up Instructions: I discussed the assessment and treatment plan with the patient. The patient was provided an opportunity to ask questions and all were answered. The patient agreed with the plan and demonstrated an understanding of the instructions.  A copy of instructions were sent to the patient via MyChart unless otherwise noted below.   The patient was advised to call back or seek an in-person evaluation if the symptoms worsen or if the condition fails to improve as anticipated.    [name], NP    [1]  Allergies Allergen Reactions   Hydrocodone-Acetaminophen  Nausea And Vomiting and Other (See Comments)   Nsaids     Stents    Vicodin [Hydrocodone-Acetaminophen ] Nausea And Vomiting  [2]  Current Outpatient Medications:    amoxicillin-clavulanate (AUGMENTIN) 875-125 MG tablet, Take 1 tablet by mouth 2 (two) times daily., Disp: 14 tablet, Rfl: 0   acetaminophen  (TYLENOL ) 500 MG tablet, Take 500-1,000 mg by mouth every 6 (six) hours as needed (pain.)., Disp: , Rfl:    aspirin  EC 81 MG tablet, Take 81 mg by mouth in the morning., Disp: , Rfl:    baclofen  5 MG TABS, Take 1 tablet (5 mg total) by mouth 3 (three) times daily. (Patient not taking: Reported on 11/01/2024), Disp: 20 tablet, Rfl: 0   cetirizine (ZYRTEC) 10 MG tablet, Take 10 mg by mouth at bedtime. , Disp: , Rfl:    clopidogrel  (PLAVIX ) 75 MG tablet, Take 1 tablet by mouth once daily, Disp: 90 tablet, Rfl: 3   diltiazem  (CARDIZEM  CD) 120 MG 24 hr capsule, TAKE 1 CAPSULE BY MOUTH IN THE MORNING AND 1 AT BEDTIME, Disp: 180 capsule, Rfl: 3   ezetimibe   (ZETIA ) 10 MG tablet, TAKE 1 TABLET BY MOUTH ONCE DAILY IN THE EVENING, Disp: 90 tablet, Rfl: 3   famotidine (PEPCID) 20 MG tablet, Take 20 mg by mouth daily before supper., Disp: , Rfl:    fluticasone (FLONASE) 50 MCG/ACT nasal spray, Place 1 spray into both nostrils in the morning., Disp: , Rfl:    levothyroxine  (SYNTHROID ) 25 MCG tablet, TAKE 1 TABLET BY MOUTH ONCE DAILY BEFORE BREAKFAST, Disp: 90 tablet, Rfl: 0   lidocaine  (XYLOCAINE ) 2 % solution, Use as directed 5-10 mLs in the mouth or throat every 6 (six) hours as needed (sore throat)., Disp: 100 mL, Rfl: 0   Misc Natural Products (OSTEO BI-FLEX TRIPLE STRENGTH PO), Take 2 tablets by mouth in the morning., Disp: , Rfl:    Multiple Vitamin (MULTIVITAMIN WITH MINERALS) TABS tablet, Take 1 tablet by mouth in the morning., Disp: , Rfl:    nitroGLYCERIN  (NITROSTAT ) 0.4 MG SL tablet, Place 1 tablet (0.4 mg total) under the tongue every 5 (five) minutes as needed for chest pain., Disp: 25 tablet, Rfl: 3   Nutritional Supplements (FRUIT & VEGETABLE DAILY) CAPS, Take  6 capsules by mouth in the morning. Balance of Nature Fruits (3) & Veggies (3), Disp: , Rfl:    predniSONE  (DELTASONE ) 20 MG tablet, Take 2 tablets (40 mg total) by mouth daily with breakfast., Disp: 10 tablet, Rfl: 0   promethazine -dextromethorphan (PROMETHAZINE -DM) 6.25-15 MG/5ML syrup, Take 5 mLs by mouth 3 (three) times daily as needed for cough. (Patient not taking: Reported on 11/01/2024), Disp: 118 mL, Rfl: 0   rosuvastatin  (CRESTOR ) 40 MG tablet, Take 1 tablet by mouth once daily, Disp: 90 tablet, Rfl: 3   sildenafil  (VIAGRA ) 100 MG tablet, Take 1 tablet (100 mg total) by mouth daily as needed for erectile dysfunction. Take two hours prior to intercourse on an empty stomach, Disp: 30 tablet, Rfl: 3   Turmeric (QC TUMERIC COMPLEX PO), Take 1 tablet by mouth daily., Disp: , Rfl:   "

## 2025-02-06 ENCOUNTER — Ambulatory Visit: Admitting: Family Medicine

## 2025-04-25 ENCOUNTER — Ambulatory Visit: Admitting: Dermatology

## 2025-05-02 ENCOUNTER — Ambulatory Visit: Admitting: Dermatology

## 2025-11-13 ENCOUNTER — Ambulatory Visit
